# Patient Record
Sex: Female | Born: 1935 | Race: White | Hispanic: No | Smoking: Never smoker
Health system: Southern US, Community
[De-identification: ages and names within clinical notes are randomized; demographics above are authoritative.]

## PROBLEM LIST (undated history)

## (undated) DIAGNOSIS — I10 Essential (primary) hypertension: Secondary | ICD-10-CM

## (undated) DIAGNOSIS — M419 Scoliosis, unspecified: Secondary | ICD-10-CM

## (undated) DIAGNOSIS — Z9289 Personal history of other medical treatment: Secondary | ICD-10-CM

## (undated) DIAGNOSIS — M1990 Unspecified osteoarthritis, unspecified site: Secondary | ICD-10-CM

## (undated) DIAGNOSIS — G4489 Other headache syndrome: Secondary | ICD-10-CM

## (undated) DIAGNOSIS — Z9889 Other specified postprocedural states: Secondary | ICD-10-CM

## (undated) DIAGNOSIS — G459 Transient cerebral ischemic attack, unspecified: Secondary | ICD-10-CM

## (undated) DIAGNOSIS — Z7409 Other reduced mobility: Secondary | ICD-10-CM

## (undated) DIAGNOSIS — K5792 Diverticulitis of intestine, part unspecified, without perforation or abscess without bleeding: Secondary | ICD-10-CM

## (undated) DIAGNOSIS — M549 Dorsalgia, unspecified: Secondary | ICD-10-CM

## (undated) DIAGNOSIS — G8929 Other chronic pain: Secondary | ICD-10-CM

## (undated) HISTORY — PX: DILATION AND CURETTAGE OF UTERUS: SHX78

## (undated) HISTORY — PX: TONSILLECTOMY: SUR1361

## (undated) HISTORY — PX: APPENDECTOMY: SHX54

---

## 1971-05-21 HISTORY — PX: VAGINAL HYSTERECTOMY: SUR661

## 2000-05-20 HISTORY — PX: TOTAL KNEE ARTHROPLASTY: SHX125

## 2000-05-20 HISTORY — PX: JOINT REPLACEMENT: SHX530

## 2013-09-22 DIAGNOSIS — M5136 Other intervertebral disc degeneration, lumbar region: Secondary | ICD-10-CM | POA: Insufficient documentation

## 2013-09-22 DIAGNOSIS — M5416 Radiculopathy, lumbar region: Secondary | ICD-10-CM | POA: Insufficient documentation

## 2013-10-04 DIAGNOSIS — M1990 Unspecified osteoarthritis, unspecified site: Secondary | ICD-10-CM | POA: Insufficient documentation

## 2013-10-04 DIAGNOSIS — E663 Overweight: Secondary | ICD-10-CM | POA: Insufficient documentation

## 2013-10-04 DIAGNOSIS — M5412 Radiculopathy, cervical region: Secondary | ICD-10-CM | POA: Insufficient documentation

## 2013-10-04 DIAGNOSIS — I10 Essential (primary) hypertension: Secondary | ICD-10-CM | POA: Diagnosis present

## 2014-10-25 DIAGNOSIS — R51 Headache: Secondary | ICD-10-CM

## 2014-10-25 DIAGNOSIS — R519 Headache, unspecified: Secondary | ICD-10-CM | POA: Insufficient documentation

## 2014-10-25 DIAGNOSIS — D329 Benign neoplasm of meninges, unspecified: Secondary | ICD-10-CM | POA: Insufficient documentation

## 2014-10-31 DIAGNOSIS — M159 Polyosteoarthritis, unspecified: Secondary | ICD-10-CM | POA: Diagnosis present

## 2014-10-31 DIAGNOSIS — K5790 Diverticulosis of intestine, part unspecified, without perforation or abscess without bleeding: Secondary | ICD-10-CM | POA: Diagnosis present

## 2015-05-25 DIAGNOSIS — K5732 Diverticulitis of large intestine without perforation or abscess without bleeding: Secondary | ICD-10-CM | POA: Insufficient documentation

## 2015-06-08 DIAGNOSIS — H903 Sensorineural hearing loss, bilateral: Secondary | ICD-10-CM | POA: Diagnosis present

## 2015-07-16 ENCOUNTER — Encounter (HOSPITAL_BASED_OUTPATIENT_CLINIC_OR_DEPARTMENT_OTHER): Payer: Self-pay | Admitting: *Deleted

## 2015-07-16 ENCOUNTER — Emergency Department (HOSPITAL_BASED_OUTPATIENT_CLINIC_OR_DEPARTMENT_OTHER): Payer: Medicare Other

## 2015-07-16 ENCOUNTER — Emergency Department (HOSPITAL_BASED_OUTPATIENT_CLINIC_OR_DEPARTMENT_OTHER)
Admission: EM | Admit: 2015-07-16 | Discharge: 2015-07-16 | Disposition: A | Discharge status: Home / Self Care | Payer: Medicare Other | Attending: Emergency Medicine | Admitting: Emergency Medicine

## 2015-07-16 DIAGNOSIS — Z79899 Other long term (current) drug therapy: Secondary | ICD-10-CM | POA: Insufficient documentation

## 2015-07-16 DIAGNOSIS — I10 Essential (primary) hypertension: Secondary | ICD-10-CM | POA: Insufficient documentation

## 2015-07-16 DIAGNOSIS — Z8739 Personal history of other diseases of the musculoskeletal system and connective tissue: Secondary | ICD-10-CM | POA: Diagnosis not present

## 2015-07-16 DIAGNOSIS — M419 Scoliosis, unspecified: Secondary | ICD-10-CM | POA: Diagnosis not present

## 2015-07-16 DIAGNOSIS — K529 Noninfective gastroenteritis and colitis, unspecified: Secondary | ICD-10-CM | POA: Insufficient documentation

## 2015-07-16 DIAGNOSIS — R1032 Left lower quadrant pain: Secondary | ICD-10-CM | POA: Diagnosis present

## 2015-07-16 HISTORY — DX: Unspecified osteoarthritis, unspecified site: M19.90

## 2015-07-16 HISTORY — DX: Scoliosis, unspecified: M41.9

## 2015-07-16 HISTORY — DX: Diverticulitis of intestine, part unspecified, without perforation or abscess without bleeding: K57.92

## 2015-07-16 HISTORY — DX: Essential (primary) hypertension: I10

## 2015-07-16 LAB — CBC WITH DIFFERENTIAL/PLATELET
Basophils Absolute: 0 10*3/uL (ref 0.0–0.1)
Basophils Relative: 0 %
EOS ABS: 0.1 10*3/uL (ref 0.0–0.7)
Eosinophils Relative: 1 %
HEMATOCRIT: 36.1 % (ref 36.0–46.0)
HEMOGLOBIN: 12.3 g/dL (ref 12.0–15.0)
LYMPHS ABS: 2.1 10*3/uL (ref 0.7–4.0)
Lymphocytes Relative: 21 %
MCH: 31 pg (ref 26.0–34.0)
MCHC: 34.1 g/dL (ref 30.0–36.0)
MCV: 90.9 fL (ref 78.0–100.0)
MONO ABS: 0.7 10*3/uL (ref 0.1–1.0)
MONOS PCT: 8 %
NEUTROS PCT: 70 %
Neutro Abs: 7 10*3/uL (ref 1.7–7.7)
Platelets: 344 10*3/uL (ref 150–400)
RBC: 3.97 MIL/uL (ref 3.87–5.11)
RDW: 13.5 % (ref 11.5–15.5)
WBC: 9.9 10*3/uL (ref 4.0–10.5)

## 2015-07-16 LAB — COMPREHENSIVE METABOLIC PANEL
ALBUMIN: 3.9 g/dL (ref 3.5–5.0)
ALK PHOS: 45 U/L (ref 38–126)
ALT: 16 U/L (ref 14–54)
AST: 25 U/L (ref 15–41)
Anion gap: 6 (ref 5–15)
BUN: 17 mg/dL (ref 6–20)
CALCIUM: 8.8 mg/dL — AB (ref 8.9–10.3)
CHLORIDE: 98 mmol/L — AB (ref 101–111)
CO2: 27 mmol/L (ref 22–32)
CREATININE: 0.71 mg/dL (ref 0.44–1.00)
GFR calc non Af Amer: >60 mL/min (ref >60)
GLUCOSE: 101 mg/dL — AB (ref 65–99)
Potassium: 3.9 mmol/L (ref 3.5–5.1)
SODIUM: 131 mmol/L — AB (ref 135–145)
Total Bilirubin: 0.3 mg/dL (ref 0.3–1.2)
Total Protein: 7 g/dL (ref 6.5–8.1)

## 2015-07-16 LAB — URINALYSIS, ROUTINE W REFLEX MICROSCOPIC
Bilirubin Urine: NEGATIVE
Glucose, UA: NEGATIVE mg/dL
HGB URINE DIPSTICK: NEGATIVE
Ketones, ur: NEGATIVE mg/dL
NITRITE: NEGATIVE
PROTEIN: NEGATIVE mg/dL
SPECIFIC GRAVITY, URINE: 1.015 (ref 1.005–1.030)
pH: 6.5 (ref 5.0–8.0)

## 2015-07-16 LAB — URINE MICROSCOPIC-ADD ON

## 2015-07-16 LAB — LIPASE, BLOOD: LIPASE: 23 U/L (ref 11–51)

## 2015-07-16 MED ORDER — ONDANSETRON HCL 4 MG/2ML IJ SOLN
4.0000 mg | Freq: Once | INTRAMUSCULAR | Status: AC
  Administered 2015-07-16: 4 mg via INTRAVENOUS
  Filled 2015-07-16: qty 2

## 2015-07-16 MED ORDER — IOHEXOL 300 MG/ML  SOLN
100.0000 mL | Freq: Once | INTRAMUSCULAR | Status: AC | PRN
  Administered 2015-07-16: 100 mL via INTRAVENOUS

## 2015-07-16 MED ORDER — HYDROCODONE-ACETAMINOPHEN 5-325 MG PO TABS
1.0000 | ORAL_TABLET | Freq: Once | ORAL | Status: AC
  Administered 2015-07-16: 1 via ORAL
  Filled 2015-07-16: qty 1

## 2015-07-16 MED ORDER — CIPROFLOXACIN HCL 500 MG PO TABS
500.0000 mg | ORAL_TABLET | Freq: Once | ORAL | Status: AC
  Administered 2015-07-16: 500 mg via ORAL
  Filled 2015-07-16: qty 1

## 2015-07-16 MED ORDER — IOHEXOL 300 MG/ML  SOLN
25.0000 mL | Freq: Once | INTRAMUSCULAR | Status: AC | PRN
Start: 2015-07-16 — End: 2015-07-16
  Administered 2015-07-16: 25 mL via ORAL

## 2015-07-16 MED ORDER — CIPROFLOXACIN HCL 500 MG PO TABS: 500.0000 mg | ORAL_TABLET | Freq: Two times a day (BID) | ORAL | Status: DC

## 2015-07-16 MED ORDER — ONDANSETRON HCL 4 MG PO TABS: 4.0000 mg | ORAL_TABLET | Freq: Four times a day (QID) | ORAL | Status: DC

## 2015-07-16 MED ORDER — SODIUM CHLORIDE 0.9 % IV BOLUS (SEPSIS)
500.0000 mL | Freq: Once | INTRAVENOUS | Status: AC
  Administered 2015-07-16: 500 mL via INTRAVENOUS

## 2015-07-16 MED ORDER — METRONIDAZOLE 500 MG PO TABS: 500.0000 mg | ORAL_TABLET | Freq: Three times a day (TID) | ORAL | Status: DC

## 2015-07-16 MED ORDER — METRONIDAZOLE 500 MG PO TABS
500.0000 mg | ORAL_TABLET | Freq: Once | ORAL | Status: AC
Start: 2015-07-16 — End: 2015-07-16
  Administered 2015-07-16: 500 mg via ORAL
  Filled 2015-07-16: qty 1

## 2015-07-16 NOTE — ED Provider Notes (Signed)
CSN: [id number]     Arrival date & time 07/16/15  1453 History  By signing my name below, I, Elizabeth Mullins, attest that this documentation has been prepared under the direction and in the presence of No att. providers found. Electronically Signed: Doran Mullins, ED Scribe. 07/17/2015. 4:06 PM.   Chief Complaint  Patient presents with  . Diverticulitis   The history is provided by the patient. No language interpreter was used.   HPI Comments: Elizabeth Mullins is a 82 y.o. female  with a PMHx diverticulitis, arthritis, HTN and scoliosis who presents to the Emergency Department complaining of gradually worsening LLQ abdominal pain that began more than 1 week ago. Pt also reports watery diarrhea 2-3 days ago and an improving cough. Pt reports that she was seen by her doctor 1 week ago who has been treating her for diverticulitis. She finished taking cipro 2 days ago. Pt took a "pain pill" for her pain 1 day ago. Pt denies any fever, chills, vomiting, or any other symptoms at this time. Pt reports she is had the flue last week. Pt denies being on blood thinners. Pt is allergic to Codeine, Demerol and Morphine.   Past Medical History  Diagnosis Date  . Diverticulitis   . Arthritis   . Hypertension   . Scoliosis    Past Surgical History  Procedure Laterality Date  . Abdominal hysterectomy    . Appendectomy    . Tonsillectomy     History reviewed. No pertinent family history. Social History  Substance Use Topics  . Smoking status: Never Smoker   . Smokeless tobacco: None  . Alcohol Use: No   OB History    No data available     Review of Systems  Constitutional: Negative for fever and chills.  HENT: Negative for congestion and rhinorrhea.   Eyes: Negative for redness and visual disturbance.  Respiratory: Positive for cough. Negative for shortness of breath and wheezing.   Cardiovascular: Negative for chest pain and palpitations.  Gastrointestinal: Positive for abdominal pain and  diarrhea. Negative for nausea and vomiting.  Genitourinary: Negative for dysuria and urgency.  Musculoskeletal: Negative for myalgias and arthralgias.  Skin: Negative for pallor and wound.  Neurological: Negative for dizziness and headaches.  All other systems reviewed and are negative.  Allergies  Codeine; Demerol; Dilaudid; Fentanyl; Morphine and related; and Nsaids  Home Medications   Prior to Admission medications   Medication Sig Start Date End Date Taking? Authorizing Provider  atenolol (TENORMIN) 50 MG tablet Take 50 mg by mouth daily.   Yes Historical Provider, MD  carisoprodol (SOMA) 350 MG tablet Take 350 mg by mouth 4 (four) times daily as needed for muscle spasms.   Yes Historical Provider, MD  ciprofloxacin (CIPRO) 500 MG tablet Take 1 tablet (500 mg total) by mouth 2 (two) times daily. 07/16/15   Quintella Reichert, MD  doxazosin (CARDURA) 2 MG tablet Take 2 mg by mouth daily.   Yes Historical Provider, MD  gabapentin (NEURONTIN) 800 MG tablet Take 800 mg by mouth 3 (three) times daily.   Yes Historical Provider, MD  hydrochlorothiazide (HYDRODIURIL) 25 MG tablet Take 25 mg by mouth daily.   Yes Historical Provider, MD  hyoscyamine (ANASPAZ) 0.125 MG TBDP disintergrating tablet Place 0.125 mg under the tongue.   Yes Historical Provider, MD  metroNIDAZOLE (FLAGYL) 500 MG tablet Take 1 tablet (500 mg total) by mouth 3 (three) times daily. 07/16/15   Quintella Reichert, MD  ondansetron (ZOFRAN) 4 MG tablet Take  1 tablet (4 mg total) by mouth every 6 (six) hours. 07/16/15   Quintella Reichert, MD  raloxifene (EVISTA) 60 MG tablet Take 60 mg by mouth daily.   Yes Historical Provider, MD   BP 133/80 mmHg  Pulse 82  Temp(Src) 98.1 F (36.7 C) (Oral)  Resp 20  Ht 4\' 11"  (1.499 m)  Wt 150 lb (68.04 kg)  BMI 30.28 kg/m2  SpO2 92%   Physical Exam  Constitutional: She is oriented to person, place, and time. She appears well-developed and well-nourished.  HENT:  Head: Normocephalic and  atraumatic.  Cardiovascular: Normal rate and regular rhythm.   No murmur heard. Pulmonary/Chest: Effort normal and breath sounds normal. No respiratory distress.  Abdominal: Soft. There is tenderness (mild LLQ). There is no rebound. Guarding: voluntary.  Musculoskeletal: She exhibits edema (non-pitting BLE). She exhibits no tenderness.  Neurological: She is alert and oriented to person, place, and time.  Skin: Skin is warm and dry.  Psychiatric: She has a normal mood and affect. Her behavior is normal.  Nursing note and vitals reviewed.   ED Course  Procedures   DIAGNOSTIC STUDIES: Oxygen Saturation is 98% on room air, normal by my interpretation.    COORDINATION OF CARE: 4:02 PM Will give fluids. Will order urinalysis, blood work and CT abdomen. Discussed treatment plan with pt at bedside and pt agreed to plan.  Labs Review Labs Reviewed  URINALYSIS, ROUTINE W REFLEX MICROSCOPIC (NOT AT [facility]) - Abnormal; Notable for the following:    Leukocytes, UA SMALL (*)    All other components within normal limits  COMPREHENSIVE METABOLIC PANEL - Abnormal; Notable for the following:    Sodium 131 (*)    Chloride 98 (*)    Glucose, Bld 101 (*)    Calcium 8.8 (*)    All other components within normal limits  URINE MICROSCOPIC-ADD ON - Abnormal; Notable for the following:    Squamous Epithelial / LPF 0-5 (*)    Bacteria, UA FEW (*)    All other components within normal limits  CBC WITH DIFFERENTIAL/PLATELET  LIPASE, BLOOD    Imaging Review Ct Abdomen Pelvis W Contrast  07/16/2015  CLINICAL DATA:  82 year old female with left lower quadrant abdominal pain for the past 10 days. EXAM: CT ABDOMEN AND PELVIS WITH CONTRAST TECHNIQUE: Multidetector CT imaging of the abdomen and pelvis was performed using the standard protocol following bolus administration of intravenous contrast. CONTRAST:  182mL OMNIPAQUE IOHEXOL 300 MG/ML SOLN, 76mL OMNIPAQUE IOHEXOL 300 MG/ML SOLN COMPARISON:  None.  FINDINGS: The visualized lung bases are clear. No intra-abdominal free air. Trace free fluid within the pelvis. A 1.5 cm hypodense lesion in the left lobe of the liver as well as a subcentimeter hypodense lesion adjacent to the IVC and dome of the liver are not well characterized but likely represent or less likely hemangiomas. The liver is otherwise unremarkable. No gallstones or pericholecystic fluid. There is a focal of the common bile duct and head of the pancreas measuring approximately 1.4 cm in diameter. Associated adjacent pancreatic lesion less likely but not excluded. MRI may provide better evaluation. There is no atrophy of the gland or dilatation of the main pancreatic duct. The spleen, and the adrenal glands appear unremarkable. Subcentimeter left renal upper pole hypodense lesion is not well characterized but likely represents a. There is no hydronephrosis on either side. The visualized ureters and urinary bladder appear unremarkable. Hysterectomy. There is extensive sigmoid diverticulosis with muscular hypertrophy. There is diffuse thickening and inflammatory  changes of the rectosigmoid compatible with colitis. There is resulting luminal narrowing secondary to inflammation as well as muscular hypertrophy and impaction of stool in the sigmoid colon. Copious amount of dense stool noted throughout the colon. No evidence of bowel obstruction. The appendix is not visualized with certainty. No inflammatory changes identified in the right lower quadrant. The mild aortoiliac atherosclerotic disease. No portal venous gas identified. There is no adenopathy. Small fat containing left inguinal hernia. There is scoliosis, osteopenia, and extensive degenerative changes of the spine. No acute fracture. IMPRESSION: Inflammatory changes and thickening of the rectosigmoid compatible with colitis or proctocolitis. No evidence of perforation or abscess. Clinical correlation recommended. Constipation with fecal impaction  within the sigmoid colon. No bowel obstruction. Extensive sigmoid diverticulosis. Focal dilatation of common bile duct at the head of the pancreas. An adjacent pancreatic lesion is less likely but not excluded. MRI or MRCP may provide better evaluation. Electronically Signed   By: Anner Crete M.D.   On: 07/16/2015 18:29   I have personally reviewed and evaluated these images and lab results as part of my medical decision-making.   EKG Interpretation None      MDM   Final diagnoses:  Colitis   Pt here with abdominal pain, diarrhea.  Pain is completely resolved on repeat evaluation in the department with soft, nontender abdomen.  She was treated for diverticulitis but has been off of abx for several days.  Will restart abx with close GI and PCP follow up, close return precautions.  She is able to tolerate po without difficulty.    I personally performed the services described in this documentation, which was scribed in my presence. The recorded information has been reviewed and is accurate.    Quintella Reichert, MD 07/17/15 1259

## 2015-07-16 NOTE — Discharge Instructions (Signed)
°  Colitis °Colitis is inflammation of the colon. Colitis may last a short time (acute) or it may last a long time (chronic). °CAUSES °This condition may be caused by: °· Viruses. °· Bacteria. °· Reactions to medicine. °· Certain autoimmune diseases, such as Crohn disease or ulcerative colitis. °SYMPTOMS °Symptoms of this condition include: °· Diarrhea. °· Passing bloody or tarry stool. °· Pain. °· Fever. °· Vomiting. °· Tiredness (fatigue). °· Weight loss. °· Bloating. °· Sudden increase in abdominal pain. °· Having fewer bowel movements than usual. °DIAGNOSIS °This condition is diagnosed with a stool test or a blood test. You may also have other tests, including X-rays, a CT scan, or a colonoscopy. °TREATMENT °Treatment may include: °· Resting the bowel. This involves not eating or drinking for a period of time. °· Fluids that are given through an IV tube. °· Medicine for pain and diarrhea. °· Antibiotic medicines. °· Cortisone medicines. °· Surgery. °HOME CARE INSTRUCTIONS °Eating and Drinking °· Follow instructions from your health care provider about eating or drinking restrictions. °· Drink enough fluid to keep your urine clear or pale yellow. °· Work with a dietitian to determine which foods cause your condition to flare up. °· Avoid foods that cause flare-ups. °· Eat a well-balanced diet. °Medicines °· Take over-the-counter and prescription medicines only as told by your health care provider. °· If you were prescribed an antibiotic medicine, take it as told by your health care provider. Do not stop taking the antibiotic even if you start to feel better. °General Instructions °· Keep all follow-up visits as told by your health care provider. This is important. °SEEK MEDICAL CARE IF: °· Your symptoms do not go away. °· You develop new symptoms. °SEEK IMMEDIATE MEDICAL CARE IF: °· You have a fever that does not go away with treatment. °· You develop chills. °· You have extreme weakness, fainting, or  dehydration. °· You have repeated vomiting. °· You develop severe pain in your abdomen. °· You pass bloody or tarry stool. °  °This information is not intended to replace advice given to you by your health care provider. Make sure you discuss any questions you have with your health care provider. °  °Document Released: 06/13/2004 Document Revised: 01/25/2015 Document Reviewed: 08/29/2014 °Elsevier Interactive Patient Education ©2016 [employer] ° °

## 2015-07-16 NOTE — ED Notes (Signed)
Pt states she tolerates PO hydrocodone well. PT requesting pain medication. Notitifed EDP.

## 2015-07-16 NOTE — ED Notes (Signed)
Generalized abdominal pain x 10 days.  Pt is being treated for diverticulitis by her PCP but denies relief.

## 2015-08-24 ENCOUNTER — Emergency Department (HOSPITAL_COMMUNITY): Payer: Medicare Other

## 2015-08-24 ENCOUNTER — Encounter (HOSPITAL_COMMUNITY): Payer: Self-pay | Admitting: *Deleted

## 2015-08-24 ENCOUNTER — Inpatient Hospital Stay (HOSPITAL_COMMUNITY)
Admission: EM | Admit: 2015-08-24 | Discharge: 2015-08-28 | DRG: 392 | Disposition: A | Discharge status: Home Health | Payer: Medicare Other | Attending: Internal Medicine | Admitting: Internal Medicine

## 2015-08-24 DIAGNOSIS — R8290 Unspecified abnormal findings in urine: Secondary | ICD-10-CM | POA: Diagnosis present

## 2015-08-24 DIAGNOSIS — R634 Abnormal weight loss: Secondary | ICD-10-CM | POA: Diagnosis present

## 2015-08-24 DIAGNOSIS — K5720 Diverticulitis of large intestine with perforation and abscess without bleeding: Principal | ICD-10-CM | POA: Diagnosis present

## 2015-08-24 DIAGNOSIS — K5700 Diverticulitis of small intestine with perforation and abscess without bleeding: Secondary | ICD-10-CM

## 2015-08-24 DIAGNOSIS — M549 Dorsalgia, unspecified: Secondary | ICD-10-CM | POA: Diagnosis not present

## 2015-08-24 DIAGNOSIS — G8929 Other chronic pain: Secondary | ICD-10-CM | POA: Diagnosis present

## 2015-08-24 DIAGNOSIS — K651 Peritoneal abscess: Secondary | ICD-10-CM | POA: Diagnosis not present

## 2015-08-24 DIAGNOSIS — I10 Essential (primary) hypertension: Secondary | ICD-10-CM | POA: Diagnosis present

## 2015-08-24 DIAGNOSIS — M1990 Unspecified osteoarthritis, unspecified site: Secondary | ICD-10-CM | POA: Diagnosis present

## 2015-08-24 DIAGNOSIS — M419 Scoliosis, unspecified: Secondary | ICD-10-CM | POA: Diagnosis present

## 2015-08-24 DIAGNOSIS — K5780 Diverticulitis of intestine, part unspecified, with perforation and abscess without bleeding: Secondary | ICD-10-CM

## 2015-08-24 DIAGNOSIS — K589 Irritable bowel syndrome without diarrhea: Secondary | ICD-10-CM | POA: Diagnosis present

## 2015-08-24 DIAGNOSIS — M12862 Other specific arthropathies, not elsewhere classified, left knee: Secondary | ICD-10-CM | POA: Diagnosis present

## 2015-08-24 DIAGNOSIS — K869 Disease of pancreas, unspecified: Secondary | ICD-10-CM | POA: Diagnosis present

## 2015-08-24 DIAGNOSIS — K8689 Other specified diseases of pancreas: Secondary | ICD-10-CM | POA: Diagnosis present

## 2015-08-24 DIAGNOSIS — R1032 Left lower quadrant pain: Secondary | ICD-10-CM | POA: Diagnosis present

## 2015-08-24 HISTORY — DX: Dorsalgia, unspecified: M54.9

## 2015-08-24 HISTORY — DX: Other headache syndrome: G44.89

## 2015-08-24 HISTORY — DX: Other chronic pain: G89.29

## 2015-08-24 LAB — URINALYSIS, ROUTINE W REFLEX MICROSCOPIC
BILIRUBIN URINE: NEGATIVE
GLUCOSE, UA: NEGATIVE mg/dL
Hgb urine dipstick: NEGATIVE
KETONES UR: NEGATIVE mg/dL
Nitrite: NEGATIVE
Protein, ur: NEGATIVE mg/dL
Specific Gravity, Urine: 1.015 (ref 1.005–1.030)
pH: 6.5 (ref 5.0–8.0)

## 2015-08-24 LAB — COMPREHENSIVE METABOLIC PANEL
ALBUMIN: 3.3 g/dL — AB (ref 3.5–5.0)
ALK PHOS: 58 U/L (ref 38–126)
ALT: 16 U/L (ref 14–54)
AST: 19 U/L (ref 15–41)
Anion gap: 9 (ref 5–15)
BILIRUBIN TOTAL: 0.4 mg/dL (ref 0.3–1.2)
BUN: 16 mg/dL (ref 6–20)
CALCIUM: 9.5 mg/dL (ref 8.9–10.3)
CO2: 24 mmol/L (ref 22–32)
Chloride: 103 mmol/L (ref 101–111)
Creatinine, Ser: 0.7 mg/dL (ref 0.44–1.00)
GFR calc Af Amer: >60 mL/min (ref >60)
GFR calc non Af Amer: >60 mL/min (ref >60)
GLUCOSE: 114 mg/dL — AB (ref 65–99)
Potassium: 4.6 mmol/L (ref 3.5–5.1)
SODIUM: 136 mmol/L (ref 135–145)
TOTAL PROTEIN: 7 g/dL (ref 6.5–8.1)

## 2015-08-24 LAB — CBC
HCT: 36.8 % (ref 36.0–46.0)
Hemoglobin: 11.9 g/dL — ABNORMAL LOW (ref 12.0–15.0)
MCH: 29.6 pg (ref 26.0–34.0)
MCHC: 32.3 g/dL (ref 30.0–36.0)
MCV: 91.5 fL (ref 78.0–100.0)
PLATELETS: 323 10*3/uL (ref 150–400)
RBC: 4.02 MIL/uL (ref 3.87–5.11)
RDW: 13.5 % (ref 11.5–15.5)
WBC: 15.9 10*3/uL — ABNORMAL HIGH (ref 4.0–10.5)

## 2015-08-24 LAB — PROTIME-INR
INR: 1.09 (ref 0.00–1.49)
Prothrombin Time: 14.3 seconds (ref 11.6–15.2)

## 2015-08-24 LAB — PHOSPHORUS: Phosphorus: 2.8 mg/dL (ref 2.5–4.6)

## 2015-08-24 LAB — LIPASE, BLOOD: Lipase: 19 U/L (ref 11–51)

## 2015-08-24 LAB — URINE MICROSCOPIC-ADD ON

## 2015-08-24 LAB — MAGNESIUM: MAGNESIUM: 2.1 mg/dL (ref 1.7–2.4)

## 2015-08-24 MED ORDER — LORAZEPAM 2 MG/ML IJ SOLN
0.2500 mg | INTRAMUSCULAR | Status: AC | PRN
  Administered 2015-08-24 – 2015-08-26 (×2): 0.25 mg via INTRAVENOUS
  Filled 2015-08-24 (×2): qty 1

## 2015-08-24 MED ORDER — ONDANSETRON HCL 4 MG/2ML IJ SOLN
4.0000 mg | Freq: Once | INTRAMUSCULAR | Status: AC
  Administered 2015-08-24: 4 mg via INTRAVENOUS
  Filled 2015-08-24: qty 2

## 2015-08-24 MED ORDER — SODIUM CHLORIDE 0.9 % IV SOLN
INTRAVENOUS | Status: DC
  Administered 2015-08-24 (×2): via INTRAVENOUS

## 2015-08-24 MED ORDER — DOXAZOSIN MESYLATE 2 MG PO TABS
2.0000 mg | ORAL_TABLET | Freq: Every day | ORAL | Status: DC
  Administered 2015-08-25 – 2015-08-28 (×4): 2 mg via ORAL
  Filled 2015-08-24 (×4): qty 1

## 2015-08-24 MED ORDER — HYDROMORPHONE HCL 1 MG/ML IJ SOLN
0.5000 mg | INTRAMUSCULAR | Status: DC | PRN
  Administered 2015-08-24 – 2015-08-25 (×6): 1 mg via INTRAVENOUS
  Administered 2015-08-27: 0.5 mg via INTRAVENOUS
  Filled 2015-08-24 (×7): qty 1

## 2015-08-24 MED ORDER — ATENOLOL 50 MG PO TABS
50.0000 mg | ORAL_TABLET | Freq: Every day | ORAL | Status: DC
  Administered 2015-08-25 – 2015-08-28 (×4): 50 mg via ORAL
  Filled 2015-08-24 (×4): qty 1

## 2015-08-24 MED ORDER — CIPROFLOXACIN IN D5W 400 MG/200ML IV SOLN
400.0000 mg | Freq: Two times a day (BID) | INTRAVENOUS | Status: DC
  Administered 2015-08-25 – 2015-08-26 (×3): 400 mg via INTRAVENOUS
  Filled 2015-08-24 (×3): qty 200

## 2015-08-24 MED ORDER — PROCHLORPERAZINE EDISYLATE 5 MG/ML IJ SOLN
5.0000 mg | Freq: Four times a day (QID) | INTRAMUSCULAR | Status: DC | PRN
  Administered 2015-08-24: 5 mg via INTRAVENOUS
  Filled 2015-08-24 (×2): qty 1

## 2015-08-24 MED ORDER — MORPHINE SULFATE (PF) 2 MG/ML IV SOLN
1.0000 mg | INTRAVENOUS | Status: DC | PRN
  Administered 2015-08-24: 2 mg via INTRAVENOUS
  Filled 2015-08-24: qty 1

## 2015-08-24 MED ORDER — ACETAMINOPHEN 650 MG RE SUPP: 650.0000 mg | Freq: Four times a day (QID) | RECTAL | Status: DC | PRN

## 2015-08-24 MED ORDER — CIPROFLOXACIN IN D5W 400 MG/200ML IV SOLN
400.0000 mg | Freq: Once | INTRAVENOUS | Status: AC
  Administered 2015-08-24: 400 mg via INTRAVENOUS
  Filled 2015-08-24: qty 200

## 2015-08-24 MED ORDER — METRONIDAZOLE IN NACL 5-0.79 MG/ML-% IV SOLN
500.0000 mg | Freq: Three times a day (TID) | INTRAVENOUS | Status: DC
  Administered 2015-08-25 – 2015-08-26 (×5): 500 mg via INTRAVENOUS
  Filled 2015-08-24 (×5): qty 100

## 2015-08-24 MED ORDER — METRONIDAZOLE IN NACL 5-0.79 MG/ML-% IV SOLN
500.0000 mg | Freq: Once | INTRAVENOUS | Status: AC
  Administered 2015-08-24: 500 mg via INTRAVENOUS
  Filled 2015-08-24: qty 100

## 2015-08-24 MED ORDER — FENTANYL CITRATE (PF) 100 MCG/2ML IJ SOLN
25.0000 ug | Freq: Once | INTRAMUSCULAR | Status: AC
  Administered 2015-08-24: 25 ug via INTRAVENOUS
  Filled 2015-08-24: qty 2

## 2015-08-24 MED ORDER — SODIUM CHLORIDE 0.9 % IV BOLUS (SEPSIS)
250.0000 mL | Freq: Once | INTRAVENOUS | Status: AC
  Administered 2015-08-24: 250 mL via INTRAVENOUS

## 2015-08-24 MED ORDER — ONDANSETRON HCL 4 MG/2ML IJ SOLN
4.0000 mg | Freq: Four times a day (QID) | INTRAMUSCULAR | Status: DC
  Administered 2015-08-24 – 2015-08-26 (×8): 4 mg via INTRAVENOUS
  Filled 2015-08-24 (×12): qty 2

## 2015-08-24 MED ORDER — ACETAMINOPHEN 325 MG PO TABS
650.0000 mg | ORAL_TABLET | Freq: Four times a day (QID) | ORAL | Status: DC | PRN
  Administered 2015-08-26: 650 mg via ORAL
  Filled 2015-08-24: qty 2

## 2015-08-24 MED ORDER — IOPAMIDOL (ISOVUE-300) INJECTION 61%
INTRAVENOUS | Status: AC
  Administered 2015-08-24: 100 mL
  Filled 2015-08-24: qty 100

## 2015-08-24 NOTE — Consult Note (Signed)
Reason for Consult: diverticulitis with abscess Referring Physician: Dr. Fredia Sorrow   HPI: Elizabeth Mullins is a 82 year old female with a history of diverticulitis, hypertension and scoliosis who presents with a 4-5 day history of LLQ abdominal pain.  Denies nausea, vomiting, fever, chills or sweats.  Endorses to 7lb weight loss over the past 2 weeks.  [place] was [place] with a episode which resolved with IV antibiotics.  Over the last 6 months, she reports 3 bouts of diverticulitis which were treated with oral antibiotics and on outpatient basis.  She had a colonoscopy 4-5 years ago in [place] which showed diverticulitis.  Strong family history of diverticulitis 7/9 sisters, mother and grandmother, some required one stage resections while others had a hartmans.  One sister who is now in her 10s had colon cancer.  ED work up reveals WBC 15.9.  CT of abdomen and pelvis with a 6cm abscess, presumably diverticular.   Denies hematochezia or diarrhea.  Denies vaginal or urethral feculent drainage.  Previous abdominal surgeries include an appendectomy and abdominal hysterectomy. Denies any cardiac history.   Past Medical History  Diagnosis Date  . Diverticulitis   . Arthritis   . Hypertension   . Scoliosis     Past Surgical History  Procedure Laterality Date  . Abdominal hysterectomy    . Appendectomy    . Tonsillectomy    . Knee arthroscopy Right     No family history on file.  Social History:  reports that she has never smoked. She does not have any smokeless tobacco history on file. She reports that she does not drink alcohol or use illicit drugs.  Allergies:  Allergies  Allergen Reactions  . Codeine Nausea And Vomiting  . Demerol [Meperidine] Nausea And Vomiting  . Dilaudid [Hydromorphone Hcl]     Hallucination   . Fentanyl     Hallucinations   . Morphine And Related     Nausea and vomiting   . Nsaids     Medications:  Scheduled Meds:  Continuous  Infusions: . [START ON 08/25/2015] sodium chloride 100 mL/hr at 08/24/15 1255  . ciprofloxacin 400 mg (08/24/15 1543)  . metronidazole 500 mg (08/24/15 1547)   PRN Meds:.   Results for orders placed or performed during the hospital encounter of 08/24/15 (from the past 48 hour(s))  Lipase, blood     Status: None   Collection Time: 08/24/15 10:08 AM  Result Value Ref Range   Lipase 19 11 - 51 U/L  Comprehensive metabolic panel     Status: Abnormal   Collection Time: 08/24/15 10:08 AM  Result Value Ref Range   Sodium 136 135 - 145 mmol/L   Potassium 4.6 3.5 - 5.1 mmol/L   Chloride 103 101 - 111 mmol/L   CO2 24 22 - 32 mmol/L   Glucose, Bld 114 (H) 65 - 99 mg/dL   BUN 16 6 - 20 mg/dL   Creatinine, Ser 0.70 0.44 - 1.00 mg/dL   Calcium 9.5 8.9 - 10.3 mg/dL   Total Protein 7.0 6.5 - 8.1 g/dL   Albumin 3.3 (L) 3.5 - 5.0 g/dL   AST 19 15 - 41 U/L   ALT 16 14 - 54 U/L   Alkaline Phosphatase 58 38 - 126 U/L   Total Bilirubin 0.4 0.3 - 1.2 mg/dL   GFR calc non Af Amer >60 >60 mL/min   GFR calc Af Amer >60 >60 mL/min    Comment: (NOTE) The eGFR has  been calculated using the CKD EPI equation. This calculation has not been validated in all clinical situations. eGFR's persistently <60 mL/min signify possible Chronic Kidney Disease.    Anion gap 9 5 - 15  CBC     Status: Abnormal   Collection Time: 08/24/15 10:08 AM  Result Value Ref Range   WBC 15.9 (H) 4.0 - 10.5 K/uL   RBC 4.02 3.87 - 5.11 MIL/uL   Hemoglobin 11.9 (L) 12.0 - 15.0 g/dL   HCT 36.8 36.0 - 46.0 %   MCV 91.5 78.0 - 100.0 fL   MCH 29.6 26.0 - 34.0 pg   MCHC 32.3 30.0 - 36.0 g/dL   RDW 13.5 11.5 - 15.5 %   Platelets 323 150 - 400 K/uL  Urinalysis, Routine w reflex microscopic (not at [facility])     Status: Abnormal   Collection Time: 08/24/15 12:50 PM  Result Value Ref Range   Color, Urine YELLOW YELLOW   APPearance CLEAR CLEAR   Specific Gravity, Urine 1.015 1.005 - 1.030   pH 6.5 5.0 - 8.0   Glucose, UA NEGATIVE  NEGATIVE mg/dL   Hgb urine dipstick NEGATIVE NEGATIVE   Bilirubin Urine NEGATIVE NEGATIVE   Ketones, ur NEGATIVE NEGATIVE mg/dL   Protein, ur NEGATIVE NEGATIVE mg/dL   Nitrite NEGATIVE NEGATIVE   Leukocytes, UA TRACE (A) NEGATIVE  Urine microscopic-add on     Status: Abnormal   Collection Time: 08/24/15 12:50 PM  Result Value Ref Range   Squamous Epithelial / LPF 0-5 (A) NONE SEEN   WBC, UA 6-30 0 - 5 WBC/hpf   RBC / HPF 0-5 0 - 5 RBC/hpf   Bacteria, UA FEW (A) NONE SEEN    Ct Abdomen Pelvis W Contrast  08/24/2015  CLINICAL DATA:  Left lower quadrant abdominal pain EXAM: CT ABDOMEN AND PELVIS WITH CONTRAST TECHNIQUE: Multidetector CT imaging of the abdomen and pelvis was performed using the standard protocol following bolus administration of intravenous contrast. CONTRAST:  1 ISOVUE-300 IOPAMIDOL (ISOVUE-300) INJECTION 61% COMPARISON:  07/16/2015 FINDINGS: Lower chest and abdominal wall: Fatty Bochdalek's hernia on the right Hepatobiliary: Cystic density segment 2 lesion measuring 13 mm. Coarse parenchymal calcifications considered incidental.No evidence of biliary obstruction or stone. Pancreas: Ovoid low-density at the pancreatic neck measuring 21 mm in axial dimension, favor cystic pancreatic mass over focal dilatation the common bile duct given the normal diameter distally the pancreatic head. No malignant type enhancement or nodule. Spleen: Unremarkable. Adrenals/Urinary Tract: Negative adrenals. No hydronephrosis or stone. 1 cm upper pole cyst on the left. Right renal cortical scarring and thinning. Distorted urinary bladder via abscess. No pneumaturia or bladder wall thickening to suggest fistula. Reproductive:Hysterectomy. Stomach/Bowel: Inflammatory changes around the sigmoid colon have improved, but there is a new 60 x 33 x 35 mm thick walled gas and fluid collection interposed between the sigmoid colon and bladder. Given previous findings and extensive diverticulosis, this is likely  diverticular abscess. Large stool volume on a chronic basis. No pericecal inflammation. Vascular/Lymphatic: No acute vascular abnormality. No mass or adenopathy. Peritoneal: No ascites or pneumoperitoneum. Musculoskeletal: No acute abnormalities. Advanced disc and facet degeneration. IMPRESSION: 1. 6 cm abscess between the sigmoid colon and bladder, presumably diverticular given recent CT findings. 2. 21 mm cystic pancreatic head mass which could be re-evaluated in 1 year by MRI or CT. Additional chronic findings are described above Electronically Signed   By: Monte Fantasia M.D.   On: 08/24/2015 14:35    Review of Systems  Constitutional: Positive for weight  loss. Negative for fever, chills, malaise/fatigue and diaphoresis.  Eyes: Negative for blurred vision, double vision, photophobia, pain, discharge and redness.  Respiratory: Negative for cough, hemoptysis, sputum production, shortness of breath and wheezing.   Cardiovascular: Negative for chest pain, palpitations, orthopnea, claudication, leg swelling and PND.  Gastrointestinal: Positive for abdominal pain. Negative for nausea, vomiting, diarrhea, constipation, blood in stool and melena.  Genitourinary: Negative for dysuria, urgency, frequency, hematuria and flank pain.  Musculoskeletal: Positive for neck pain. Negative for myalgias, back pain and joint pain.  Neurological: Negative for dizziness, tingling, tremors, sensory change, speech change, focal weakness, seizures, loss of consciousness, weakness and headaches.  Psychiatric/Behavioral: Negative for depression, suicidal ideas, hallucinations, memory loss and substance abuse. The patient is not nervous/anxious and does not have insomnia.    Blood pressure 154/76, pulse 85, temperature 97.9 F (36.6 C), temperature source Oral, resp. rate 16, SpO2 96 %. Physical Exam  Constitutional: She is oriented to person, place, and time. She appears well-developed and well-nourished. No distress.   Cardiovascular: Normal rate, regular rhythm and intact distal pulses.  Exam reveals no friction rub.   No murmur heard. Respiratory: Effort normal and breath sounds normal. No respiratory distress. She has no wheezes. She has no rales. She exhibits no tenderness.  GI: Soft. She exhibits no distension and no mass. There is no rebound and no guarding.  Mild ttp LLQ  Musculoskeletal: Normal range of motion. She exhibits no edema or tenderness.  Neurological: She is alert and oriented to person, place, and time.  Skin: Skin is warm and dry. No rash noted. She is not diaphoretic. No erythema. No pallor.  Psychiatric: She has a normal mood and affect. Her behavior is normal. Judgment and thought content normal.    Assessment/Plan: Presumed sigmoid diverticulitis with 6cm abscess The patient does not appear ill.  There are no indications for urgent surgical intervention.  Will ask IR if able to drain the abscess.  The patient understands should she acutely worsen that she may require a hartmann's procedure.  ID-rocephin/flagyl FEN-may have sips of clears, NPO after midnight VTE prophylaxis-may have chemical VTE  Dispo-medical admission, floor okay from surgical standpoint  Oneida Arenas [facility] ANP-BC Pager [phone] 08/24/2015, 4:06 PM

## 2015-08-24 NOTE — H&P (Signed)
Chief Complaint: Abdominal pain  Referring Physician(s): Serita Grammes  Supervising Physician: Marybelle Killings  History of Present Illness: Elizabeth Mullins is a 82 y.o. female with a PMHx diverticulitis, arthritis, HTN and scoliosis who presented to the Emergency Department complaining of gradually worsening LLQ abdominal pain that began more than 1 week ago.   She also reports watery diarrhea 2-3 days ago and an improving cough.   She reports that she was seen by her doctor 1 week ago who has been treating her for diverticulitis.   She finished taking cipro 2 days ago. Pt took a "pain pill" for her pain 1 day ago.   She also states she has had some issues with constipation.  She denies any fever, chills, vomiting, or any other symptoms at this time.   She reports she is had the flue last week. Pt denies being on blood thinners.  CT scan shows Inflammatory changes around the sigmoid colon have improved, but there is a new 60 x 33 x 35 mm thick walled gas and fluid collection interposed between the sigmoid colon and bladder. Given previous findings and extensive diverticulosis, this is likely diverticular abscess.  We are asked to evaluate her for a CT guided drain placement.  INR is pending  Past Medical History  Diagnosis Date  . Diverticulitis   . Arthritis   . Hypertension   . Scoliosis     Past Surgical History  Procedure Laterality Date  . Abdominal hysterectomy    . Appendectomy    . Tonsillectomy    . Knee arthroscopy Right     Allergies: Codeine; Demerol; Dilaudid; Fentanyl; Morphine and related; and Nsaids  Medications: Prior to Admission medications   Medication Sig Start Date End Date Taking? Authorizing Provider  atenolol (TENORMIN) 50 MG tablet Take 50 mg by mouth daily.   Yes Historical Provider, MD  carisoprodol (SOMA) 350 MG tablet Take 350 mg by mouth 4 (four) times daily as needed for muscle spasms.   Yes Historical Provider, MD  doxazosin  (CARDURA) 2 MG tablet Take 2 mg by mouth daily.   Yes Historical Provider, MD  gabapentin (NEURONTIN) 800 MG tablet Take 800 mg by mouth 3 (three) times daily.   Yes Historical Provider, MD  hydrochlorothiazide (HYDRODIURIL) 25 MG tablet Take 25 mg by mouth daily.   Yes Historical Provider, MD  hyoscyamine (ANASPAZ) 0.125 MG TBDP disintergrating tablet Place 0.125 mg under the tongue.   Yes Historical Provider, MD  raloxifene (EVISTA) 60 MG tablet Take 60 mg by mouth daily.   Yes Historical Provider, MD  ciprofloxacin (CIPRO) 500 MG tablet Take 1 tablet (500 mg total) by mouth 2 (two) times daily. 07/16/15   Quintella Reichert, MD  metroNIDAZOLE (FLAGYL) 500 MG tablet Take 1 tablet (500 mg total) by mouth 3 (three) times daily. 07/16/15   Quintella Reichert, MD  ondansetron (ZOFRAN) 4 MG tablet Take 1 tablet (4 mg total) by mouth every 6 (six) hours. 07/16/15   Quintella Reichert, MD     No family history on file.  Social History   Social History  . Marital Status: Widowed    Spouse Name: N/A  . Number of Children: N/A  . Years of Education: N/A   Social History Main Topics  . Smoking status: Never Smoker   . Smokeless tobacco: None  . Alcohol Use: No  . Drug Use: No  . Sexual Activity: Not Asked   Other Topics Concern  . None   Social History  Narrative    Review of Systems: A 12 point ROS discussed  Review of Systems  Constitutional: Positive for appetite change. Negative for fever, chills, activity change and fatigue.  HENT: Negative.   Respiratory: Positive for cough. Negative for chest tightness.   Cardiovascular: Negative for chest pain.  Gastrointestinal: Positive for abdominal pain, diarrhea and constipation. Negative for nausea and vomiting.  Genitourinary: Negative.   Musculoskeletal: Negative.   Skin: Negative.   Neurological: Negative.   Psychiatric/Behavioral: Negative.     Vital Signs: BP 163/82 mmHg  Pulse 92  Temp(Src) 97.9 F (36.6 C) (Oral)  Resp 20  SpO2  97%  Physical Exam  Constitutional: She is oriented to person, place, and time. She appears well-developed and well-nourished.  HENT:  Head: Normocephalic and atraumatic.  Eyes: EOM are normal.  Neck: Normal range of motion. Neck supple.  Cardiovascular: Normal rate, regular rhythm and normal heart sounds.   Pulmonary/Chest: Effort normal and breath sounds normal.  Abdominal: There is tenderness.  Somewhat firm, likely secondary to constipation.  Musculoskeletal: Normal range of motion.  Neurological: She is alert and oriented to person, place, and time.  Skin: Skin is warm and dry.  Psychiatric: She has a normal mood and affect. Her behavior is normal. Judgment and thought content normal.  Vitals reviewed.   Mallampati Score:  MD Evaluation Airway: WNL Heart: WNL Abdomen: WNL Chest/ Lungs: WNL ASA  Classification: 3 Mallampati/Airway Score: One  Imaging: Ct Abdomen Pelvis W Contrast  08/24/2015  CLINICAL DATA:  Left lower quadrant abdominal pain EXAM: CT ABDOMEN AND PELVIS WITH CONTRAST TECHNIQUE: Multidetector CT imaging of the abdomen and pelvis was performed using the standard protocol following bolus administration of intravenous contrast. CONTRAST:  1 ISOVUE-300 IOPAMIDOL (ISOVUE-300) INJECTION 61% COMPARISON:  07/16/2015 FINDINGS: Lower chest and abdominal wall: Fatty Bochdalek's hernia on the right Hepatobiliary: Cystic density segment 2 lesion measuring 13 mm. Coarse parenchymal calcifications considered incidental.No evidence of biliary obstruction or stone. Pancreas: Ovoid low-density at the pancreatic neck measuring 21 mm in axial dimension, favor cystic pancreatic mass over focal dilatation the common bile duct given the normal diameter distally the pancreatic head. No malignant type enhancement or nodule. Spleen: Unremarkable. Adrenals/Urinary Tract: Negative adrenals. No hydronephrosis or stone. 1 cm upper pole cyst on the left. Right renal cortical scarring and thinning.  Distorted urinary bladder via abscess. No pneumaturia or bladder wall thickening to suggest fistula. Reproductive:Hysterectomy. Stomach/Bowel: Inflammatory changes around the sigmoid colon have improved, but there is a new 60 x 33 x 35 mm thick walled gas and fluid collection interposed between the sigmoid colon and bladder. Given previous findings and extensive diverticulosis, this is likely diverticular abscess. Large stool volume on a chronic basis. No pericecal inflammation. Vascular/Lymphatic: No acute vascular abnormality. No mass or adenopathy. Peritoneal: No ascites or pneumoperitoneum. Musculoskeletal: No acute abnormalities. Advanced disc and facet degeneration. IMPRESSION: 1. 6 cm abscess between the sigmoid colon and bladder, presumably diverticular given recent CT findings. 2. 21 mm cystic pancreatic head mass which could be re-evaluated in 1 year by MRI or CT. Additional chronic findings are described above Electronically Signed   By: Monte Fantasia M.D.   On: 08/24/2015 14:35    Labs:  CBC:  Recent Labs  07/16/15 1620 08/24/15 1008  WBC 9.9 15.9*  HGB 12.3 11.9*  HCT 36.1 36.8  PLT 344 323    COAGS: No results for input(s): INR, APTT in the last 8760 hours.  BMP:  Recent Labs  07/16/15 1620 08/24/15  1008  NA 131* 136  K 3.9 4.6  CL 98* 103  CO2 27 24  GLUCOSE 101* 114*  BUN 17 16  CALCIUM 8.8* 9.5  CREATININE 0.71 0.70  GFRNONAA >60 >60  GFRAA >60 >60    LIVER FUNCTION TESTS:  Recent Labs  07/16/15 1620 08/24/15 1008  BILITOT 0.3 0.4  AST 25 19  ALT 16 16  ALKPHOS 45 58  PROT 7.0 7.0  ALBUMIN 3.9 3.3*    TUMOR MARKERS: No results for input(s): AFPTM, CEA, CA199, CHROMGRNA in the last 8760 hours.  Assessment and Plan:  Abdominal pain secondary to diverticular abscess  Will proceed with CT guided drain placement tomorrow by Dr. Anselm Pancoast  Risks and Benefits discussed with the patient including bleeding, infection, damage to adjacent structures,  bowel perforation/fistula connection, and sepsis.  All of the patient's questions were answered, patient is agreeable to proceed. Consent signed and in chart.  Thank you for this interesting consult.  I greatly enjoyed meeting Haiden Toppin and look forward to participating in their care.  A copy of this report was sent to the requesting provider on this date.  Electronically Signed: Murrell Redden PA-C 08/24/2015, 4:29 PM   I spent a total of 40 Minutes in face to face in clinical consultation, greater than 50% of which was counseling/coordinating care for abscess drain.

## 2015-08-24 NOTE — Progress Notes (Signed)
Patient was admitted around 1745H c/o pain with slight relief from morphine given in ED. Called and notified Erin Hearing (NP) about it and told her too of the patient's request of different type of medicine. Telephone ordered received and transcribed. See orders.

## 2015-08-24 NOTE — H&P (Signed)
[facility] History and Physical                                                                                    Elizabeth Mullins, is a 82 y.o. female  MRN: [id number]   DOB - March 23, 1932  Admit Date - 08/24/2015  Outpatient Primary MD for the patient is Idamae Schuller, MD  Referring MD: Rogene Houston / ER  Consulting M.D: Donne Hazel / Surgery; Hoss / Interventional Radiology  PMH: Past Medical History  Diagnosis Date  . Diverticulitis   . Arthritis   . Hypertension   . Scoliosis       PSH: Past Surgical History  Procedure Laterality Date  . Abdominal hysterectomy    . Appendectomy    . Tonsillectomy    . Knee arthroscopy Right      CC:  Chief Complaint  Patient presents with  . Abdominal Pain     HPI: 82 year old female patient who receives her primary care in [place]. She has past medical history of hypertension, irritable bowel syndrome, chronic back pain as well as a recent diagnosis of inflammatory arthropathy for which she's recently received a cortisone injection in her left knee and had begun a pulse pack of prednisone for the past 2 days. Patient reports that beginning in February began having abdominal pain primarily in the left lower quadrant and her primary care physician initially treated this as uncompensated diverticulitis with oral antibiotics. Patient will feel better for a few days and then redevelop symptoms. On her second episode she was sent for CT scan and was told she had colitis or inflammation involving "the entire colon". He again she was given a course of antibiotics and improved for only a few days. Since February the patient has been treated a total of 3 times with antibiotics for diverticulitis and colitis. Over the past few days patient has had increasing lower quadrant abdominal pain and now is radiating to the back and has become more "jabbing" and different than previous. Does not she has to urinate all the time and also  feels like she must pass a bowel movement. She really hasn't eaten much over the past several weeks and has lost 7 pounds. She has been taking Vicodin recently with only minimal improvement in her pain symptoms.  ER Evaluation and treatment: Temp 97.9-BP 162/75-pulse 86-respirations 16, room air saturations 97% CT of the abdomen and pelvis with contrast: Previously seen inflammatory changes around the sigmoid colon have improved but there is a new 6 x 3.3 x 3.5 cm thick walled gas and fluid filled collection interposed between the sigmoid colon and the bladder which is suspicious for diverticular abscess. Large stool volume on a chronic basis based on comparison with previous imaging. Is also an incidental finding of a ovoid low-density area of the pancreatic neck measuring 2.1 cm that is likely consistent with a pancreatic mass-the radiologist's notes there is no malignant type enhancement or nodule recommendation to be reevaluated in 1 year by either MRI or CT Lab data: Na 136, K 4.6, BUN 16, Cr 0.70, glucose 114, albumin 3.3, WBCs 15,900 without differential being obtained, hemoglobin 15.9, platelets 323,000, urinalysis  abnormal with few bacteria negative nitrite trace leukocytes, wbc's 6-30 Zofran 4 mg IV 2 doses Normal saline bolus 250 mL Cipro 400 mg IV 1 Flagyl 500 mg IV 1 Fentanyl 25 g IV 1  Review of Systems   In addition to the HPI above,  No Fever-chills, myalgias or other constitutional symptoms No Headache, changes with Vision or hearing, new weakness, tingling, numbness in any extremity, No problems swallowing food or Liquids, indigestion/reflux No Chest pain, Cough or Shortness of Breath, palpitations, orthopnea or DOE No N/V; no melena or hematochezia, no dark tarry stools No dysuria, hematuria or flank pain No new skin rashes, lesions, masses or bruises, No new joints pains-aches No recent weight gain or loss No polyuria, polydypsia or polyphagia,  *A full 10 point  Review of Systems was done, except as stated above, all other Review of Systems were negative.  Social History Social History  Substance Use Topics  . Smoking status: Never Smoker   . Smokeless tobacco: Not on file  . Alcohol Use: No    Resides at: Private residence  Lives with: Alone  Ambulatory status:   Family History No family history on file. Father with prostate cancer   Prior to Admission medications   Medication Sig Start Date End Date Taking? Authorizing Provider  atenolol (TENORMIN) 50 MG tablet Take 50 mg by mouth daily.   Yes Historical Provider, MD  carisoprodol (SOMA) 350 MG tablet Take 350 mg by mouth 4 (four) times daily as needed for muscle spasms.   Yes Historical Provider, MD  doxazosin (CARDURA) 2 MG tablet Take 2 mg by mouth daily.   Yes Historical Provider, MD  gabapentin (NEURONTIN) 800 MG tablet Take 800 mg by mouth 3 (three) times daily.   Yes Historical Provider, MD  hydrochlorothiazide (HYDRODIURIL) 25 MG tablet Take 25 mg by mouth daily.   Yes Historical Provider, MD  hyoscyamine (ANASPAZ) 0.125 MG TBDP disintergrating tablet Place 0.125 mg under the tongue.   Yes Historical Provider, MD  raloxifene (EVISTA) 60 MG tablet Take 60 mg by mouth daily.   Yes Historical Provider, MD  ciprofloxacin (CIPRO) 500 MG tablet Take 1 tablet (500 mg total) by mouth 2 (two) times daily. 07/16/15   Quintella Reichert, MD  metroNIDAZOLE (FLAGYL) 500 MG tablet Take 1 tablet (500 mg total) by mouth 3 (three) times daily. 07/16/15   Quintella Reichert, MD  ondansetron (ZOFRAN) 4 MG tablet Take 1 tablet (4 mg total) by mouth every 6 (six) hours. 07/16/15   Quintella Reichert, MD    Allergies  Allergen Reactions  . Codeine Nausea And Vomiting  . Demerol [Meperidine] Nausea And Vomiting  . Dilaudid [Hydromorphone Hcl]     Hallucination   . Fentanyl     Hallucinations   . Morphine And Related     Nausea and vomiting   . Nsaids     Physical Exam  Vitals  Blood pressure  163/82, pulse 92, temperature 97.9 F (36.6 C), temperature source Oral, resp. rate 20, SpO2 97 %.   General:  In no acute distress, appears healthy and well nourished and younger than stated age  Psych:  Normal affect, Denies Suicidal or Homicidal ideations, Awake Alert, Oriented X 3. Speech and thought patterns are clear and appropriate, no apparent short term memory deficits  Neuro:   No focal neurological deficits, CN II through XII intact, Strength 5/5 all 4 extremities, Sensation intact all 4 extremities.  ENT:  Ears and Eyes appear Normal, Conjunctivae clear, PER. Moist  oral mucosa without erythema or exudates.  Neck:  Supple, No lymphadenopathy appreciated  Respiratory:  Symmetrical chest wall movement, Good air movement bilaterally, CTAB. Room Air  Cardiac:  RRR, No Murmurs, no LE edema noted, no JVD, No carotid bruits, peripheral pulses palpable at 2+  Abdomen:  Positive bowel sounds, Soft, Non tender, Non distended,  No masses appreciated, no obvious hepatosplenomegaly  Skin:  No Cyanosis, Normal Skin Turgor, No Skin Rash or Bruise.  Extremities: Symmetrical without obvious trauma or injury,  no effusions.  Data Review  CBC  Recent Labs Lab 08/24/15 1008  WBC 15.9*  HGB 11.9*  HCT 36.8  PLT 323  MCV 91.5  MCH 29.6  MCHC 32.3  RDW 13.5    Chemistries   Recent Labs Lab 08/24/15 1008  NA 136  K 4.6  CL 103  CO2 24  GLUCOSE 114*  BUN 16  CREATININE 0.70  CALCIUM 9.5  AST 19  ALT 16  ALKPHOS 58  BILITOT 0.4    CrCl cannot be calculated (Unknown ideal weight.).  No results for input(s): TSH, T4TOTAL, T3FREE, THYROIDAB in the last 72 hours.  Invalid input(s): FREET3  Coagulation profile No results for input(s): INR, PROTIME in the last 168 hours.  No results for input(s): DDIMER in the last 72 hours.  Cardiac Enzymes No results for input(s): CKMB, TROPONINI, MYOGLOBIN in the last 168 hours.  Invalid input(s): CK  Invalid input(s):  POCBNP  Urinalysis    Component Value Date/Time   COLORURINE YELLOW 08/24/2015 1250   APPEARANCEUR CLEAR 08/24/2015 1250   LABSPEC 1.015 08/24/2015 1250   PHURINE 6.5 08/24/2015 1250   GLUCOSEU NEGATIVE 08/24/2015 1250   HGBUR NEGATIVE 08/24/2015 1250   BILIRUBINUR NEGATIVE 08/24/2015 1250   KETONESUR NEGATIVE 08/24/2015 1250   PROTEINUR NEGATIVE 08/24/2015 1250   NITRITE NEGATIVE 08/24/2015 1250   LEUKOCYTESUR TRACE* 08/24/2015 1250    Imaging results:   Ct Abdomen Pelvis W Contrast  08/24/2015  CLINICAL DATA:  Left lower quadrant abdominal pain EXAM: CT ABDOMEN AND PELVIS WITH CONTRAST TECHNIQUE: Multidetector CT imaging of the abdomen and pelvis was performed using the standard protocol following bolus administration of intravenous contrast. CONTRAST:  1 ISOVUE-300 IOPAMIDOL (ISOVUE-300) INJECTION 61% COMPARISON:  07/16/2015 FINDINGS: Lower chest and abdominal wall: Fatty Bochdalek's hernia on the right Hepatobiliary: Cystic density segment 2 lesion measuring 13 mm. Coarse parenchymal calcifications considered incidental.No evidence of biliary obstruction or stone. Pancreas: Ovoid low-density at the pancreatic neck measuring 21 mm in axial dimension, favor cystic pancreatic mass over focal dilatation the common bile duct given the normal diameter distally the pancreatic head. No malignant type enhancement or nodule. Spleen: Unremarkable. Adrenals/Urinary Tract: Negative adrenals. No hydronephrosis or stone. 1 cm upper pole cyst on the left. Right renal cortical scarring and thinning. Distorted urinary bladder via abscess. No pneumaturia or bladder wall thickening to suggest fistula. Reproductive:Hysterectomy. Stomach/Bowel: Inflammatory changes around the sigmoid colon have improved, but there is a new 60 x 33 x 35 mm thick walled gas and fluid collection interposed between the sigmoid colon and bladder. Given previous findings and extensive diverticulosis, this is likely diverticular abscess.  Large stool volume on a chronic basis. No pericecal inflammation. Vascular/Lymphatic: No acute vascular abnormality. No mass or adenopathy. Peritoneal: No ascites or pneumoperitoneum. Musculoskeletal: No acute abnormalities. Advanced disc and facet degeneration. IMPRESSION: 1. 6 cm abscess between the sigmoid colon and bladder, presumably diverticular given recent CT findings. 2. 21 mm cystic pancreatic head mass which could be re-evaluated in  1 year by MRI or CT. Additional chronic findings are described above Electronically Signed   By: Monte Fantasia M.D.   On: 08/24/2015 14:35     Assessment & Plan  Principal Problem:   Diverticulitis of colon with perforation/Intra-abdominal abscess  -Has had a total of 3 courses of antibiotics since February with recurrence of diverticulitis and now has abscess -Admit to floor/Inpatient -Appreciate surgical and interventional radiology consultations -Plan is to pursue percutaneous drainage of abscess tomorrow -Continue Cipro and Flagyl IV -Bowel rest -IV morphine for pain-patient clarified that she primarily has nausea with these medications and truly isn't allergic and does better if given anti-emetics prophylactically -I have started scheduled Zofran and will allow when necessary Compazine -IV fluids -Follow-up on percutaneous drain cultures -I have also ordered blood cultures  Active Problems:   HTN  -Not well controlled but likely been influenced by ongoing abdominal pain -We'll hold oral medications today but likely can resume with a sip of water tomorrow    Abnormal urinalysis -Likely related to proximetry to intra-abdominal abscess -Follow up on urine culture -Above ordered antibiotics would cover for UTI    ?? Pancreatic mass -Radiologist states appears cystic in nature and does not appear to be malignant in nature and recommends follow-up in one year with either CT or MRI    IBS -Currently asymptomatic     Inflammatory arthropathy   -New diagnosis and recently has undergone cortisone injection to the left knee -Started pulsed dose prednisone 2 days ago but we have stopped this in setting of acute infectious process    Chronic back pain    DVT Prophylaxis: SCDs until percutaneous drain placed at request of interventional radiology  Family Communication: Daughter at bedside -also patient's son updated via telephone per attending physician  Code Status:  Full code  Condition:  Stable  Discharge disposition: Once medically stable anticipate discharge back to home-likely will discharge with percutaneous drain in place so will need to have home health RN to follow  Time spent in minutes : 60      ELLIS,ALLISON L. ANP on 08/24/2015 at 4:37 PM  You may contact me by going to [email or web address] - password [email or web address]  I am available from 7a-7p but please confirm I am on the schedule by going to Amion as above.   After 7p please contact night coverage person covering me after hours  [facility]

## 2015-08-24 NOTE — ED Provider Notes (Signed)
CSN: [id number]     Arrival date & time 08/24/15  0931 History   First MD Initiated Contact with Patient 08/24/15 1113     Chief Complaint  Patient presents with  . Abdominal Pain     (Consider location/radiation/quality/duration/timing/severity/associated sxs/prior Treatment) The history is provided by the patient and a relative.   82 year old female with complaint of lower quadrant abdominal pain on and off for the past 6 months. Patient had CT scan done in February that was consistent with colitis. Patient's also had diverticulitis in the past. Patient's had increased pain in the left lower quadrant in the past week. Not associated with nausea vomiting or diarrhea. Patient last completed a course of antibiotics for diverticulitis presumably 2 weeks ago. Patient is still taking prednisone. Patient also states that she's had decreased appetite and unexpected weight loss. And fatigue. Denies fevers.  Past Medical History  Diagnosis Date  . Diverticulitis   . Arthritis   . Hypertension   . Scoliosis    Past Surgical History  Procedure Laterality Date  . Abdominal hysterectomy    . Appendectomy    . Tonsillectomy    . Knee arthroscopy Right    No family history on file. Social History  Substance Use Topics  . Smoking status: Never Smoker   . Smokeless tobacco: None  . Alcohol Use: No   OB History    No data available     Review of Systems  Constitutional: Positive for appetite change, fatigue and unexpected weight change.  HENT: Negative for congestion.   Eyes: Negative for visual disturbance.  Respiratory: Negative for shortness of breath.   Cardiovascular: Negative for chest pain.  Gastrointestinal: Positive for abdominal pain. Negative for nausea, vomiting and diarrhea.  Genitourinary: Negative for dysuria.  Musculoskeletal: Negative for back pain.  Skin: Negative for rash.  Neurological: Negative for headaches.  Hematological: Does not bruise/bleed easily.   Psychiatric/Behavioral: Negative for confusion.      Allergies  Codeine; Demerol; Dilaudid; Fentanyl; Morphine and related; and Nsaids  Home Medications   Prior to Admission medications   Medication Sig Start Date End Date Taking? Authorizing Provider  atenolol (TENORMIN) 50 MG tablet Take 50 mg by mouth daily.   Yes Historical Provider, MD  carisoprodol (SOMA) 350 MG tablet Take 350 mg by mouth 4 (four) times daily as needed for muscle spasms.   Yes Historical Provider, MD  doxazosin (CARDURA) 2 MG tablet Take 2 mg by mouth daily.   Yes Historical Provider, MD  gabapentin (NEURONTIN) 800 MG tablet Take 800 mg by mouth 3 (three) times daily.   Yes Historical Provider, MD  hydrochlorothiazide (HYDRODIURIL) 25 MG tablet Take 25 mg by mouth daily.   Yes Historical Provider, MD  hyoscyamine (ANASPAZ) 0.125 MG TBDP disintergrating tablet Place 0.125 mg under the tongue.   Yes Historical Provider, MD  raloxifene (EVISTA) 60 MG tablet Take 60 mg by mouth daily.   Yes Historical Provider, MD  ciprofloxacin (CIPRO) 500 MG tablet Take 1 tablet (500 mg total) by mouth 2 (two) times daily. 07/16/15   Quintella Reichert, MD  metroNIDAZOLE (FLAGYL) 500 MG tablet Take 1 tablet (500 mg total) by mouth 3 (three) times daily. 07/16/15   Quintella Reichert, MD  ondansetron (ZOFRAN) 4 MG tablet Take 1 tablet (4 mg total) by mouth every 6 (six) hours. 07/16/15   Quintella Reichert, MD   BP 124/89 mmHg  Pulse 89  Temp(Src) 97.9 F (36.6 C) (Oral)  Resp 18  SpO2 95% Physical  Exam  Constitutional: She is oriented to person, place, and time. She appears well-developed and well-nourished. No distress.  HENT:  Head: Normocephalic and atraumatic.  Mouth/Throat: Oropharynx is clear and moist.  Eyes: Conjunctivae and EOM are normal. Pupils are equal, round, and reactive to light.  Neck: Normal range of motion. Neck supple.  Cardiovascular: Normal rate, regular rhythm and normal heart sounds.   No murmur  heard. Pulmonary/Chest: Effort normal and breath sounds normal. No respiratory distress.  Abdominal: Soft. Bowel sounds are normal. There is tenderness.  Tender left lower quadrant no guarding.  Musculoskeletal: Normal range of motion.  Neurological: She is alert and oriented to person, place, and time. No cranial nerve deficit. She exhibits normal muscle tone. Coordination normal.  Skin: Skin is warm. No rash noted.  Nursing note and vitals reviewed.   ED Course  Procedures (including critical care time) Labs Review Labs Reviewed  COMPREHENSIVE METABOLIC PANEL - Abnormal; Notable for the following:    Glucose, Bld 114 (*)    Albumin 3.3 (*)    All other components within normal limits  CBC - Abnormal; Notable for the following:    WBC 15.9 (*)    Hemoglobin 11.9 (*)    All other components within normal limits  URINALYSIS, ROUTINE W REFLEX MICROSCOPIC (NOT AT [facility]) - Abnormal; Notable for the following:    Leukocytes, UA TRACE (*)    All other components within normal limits  URINE MICROSCOPIC-ADD ON - Abnormal; Notable for the following:    Squamous Epithelial / LPF 0-5 (*)    Bacteria, UA FEW (*)    All other components within normal limits  LIPASE, BLOOD   Results for orders placed or performed during the hospital encounter of 08/24/15  Lipase, blood  Result Value Ref Range   Lipase 19 11 - 51 U/L  Comprehensive metabolic panel  Result Value Ref Range   Sodium 136 135 - 145 mmol/L   Potassium 4.6 3.5 - 5.1 mmol/L   Chloride 103 101 - 111 mmol/L   CO2 24 22 - 32 mmol/L   Glucose, Bld 114 (H) 65 - 99 mg/dL   BUN 16 6 - 20 mg/dL   Creatinine, Ser 0.70 0.44 - 1.00 mg/dL   Calcium 9.5 8.9 - 10.3 mg/dL   Total Protein 7.0 6.5 - 8.1 g/dL   Albumin 3.3 (L) 3.5 - 5.0 g/dL   AST 19 15 - 41 U/L   ALT 16 14 - 54 U/L   Alkaline Phosphatase 58 38 - 126 U/L   Total Bilirubin 0.4 0.3 - 1.2 mg/dL   GFR calc non Af Amer >60 >60 mL/min   GFR calc Af Amer >60 >60 mL/min   Anion  gap 9 5 - 15  CBC  Result Value Ref Range   WBC 15.9 (H) 4.0 - 10.5 K/uL   RBC 4.02 3.87 - 5.11 MIL/uL   Hemoglobin 11.9 (L) 12.0 - 15.0 g/dL   HCT 36.8 36.0 - 46.0 %   MCV 91.5 78.0 - 100.0 fL   MCH 29.6 26.0 - 34.0 pg   MCHC 32.3 30.0 - 36.0 g/dL   RDW 13.5 11.5 - 15.5 %   Platelets 323 150 - 400 K/uL  Urinalysis, Routine w reflex microscopic (not at [facility])  Result Value Ref Range   Color, Urine YELLOW YELLOW   APPearance CLEAR CLEAR   Specific Gravity, Urine 1.015 1.005 - 1.030   pH 6.5 5.0 - 8.0   Glucose, UA NEGATIVE NEGATIVE mg/dL  Hgb urine dipstick NEGATIVE NEGATIVE   Bilirubin Urine NEGATIVE NEGATIVE   Ketones, ur NEGATIVE NEGATIVE mg/dL   Protein, ur NEGATIVE NEGATIVE mg/dL   Nitrite NEGATIVE NEGATIVE   Leukocytes, UA TRACE (A) NEGATIVE  Urine microscopic-add on  Result Value Ref Range   Squamous Epithelial / LPF 0-5 (A) NONE SEEN   WBC, UA 6-30 0 - 5 WBC/hpf   RBC / HPF 0-5 0 - 5 RBC/hpf   Bacteria, UA FEW (A) NONE SEEN     Imaging Review Ct Abdomen Pelvis W Contrast  08/24/2015  CLINICAL DATA:  Left lower quadrant abdominal pain EXAM: CT ABDOMEN AND PELVIS WITH CONTRAST TECHNIQUE: Multidetector CT imaging of the abdomen and pelvis was performed using the standard protocol following bolus administration of intravenous contrast. CONTRAST:  1 ISOVUE-300 IOPAMIDOL (ISOVUE-300) INJECTION 61% COMPARISON:  07/16/2015 FINDINGS: Lower chest and abdominal wall: Fatty Bochdalek's hernia on the right Hepatobiliary: Cystic density segment 2 lesion measuring 13 mm. Coarse parenchymal calcifications considered incidental.No evidence of biliary obstruction or stone. Pancreas: Ovoid low-density at the pancreatic neck measuring 21 mm in axial dimension, favor cystic pancreatic mass over focal dilatation the common bile duct given the normal diameter distally the pancreatic head. No malignant type enhancement or nodule. Spleen: Unremarkable. Adrenals/Urinary Tract: Negative adrenals. No  hydronephrosis or stone. 1 cm upper pole cyst on the left. Right renal cortical scarring and thinning. Distorted urinary bladder via abscess. No pneumaturia or bladder wall thickening to suggest fistula. Reproductive:Hysterectomy. Stomach/Bowel: Inflammatory changes around the sigmoid colon have improved, but there is a new 60 x 33 x 35 mm thick walled gas and fluid collection interposed between the sigmoid colon and bladder. Given previous findings and extensive diverticulosis, this is likely diverticular abscess. Large stool volume on a chronic basis. No pericecal inflammation. Vascular/Lymphatic: No acute vascular abnormality. No mass or adenopathy. Peritoneal: No ascites or pneumoperitoneum. Musculoskeletal: No acute abnormalities. Advanced disc and facet degeneration. IMPRESSION: 1. 6 cm abscess between the sigmoid colon and bladder, presumably diverticular given recent CT findings. 2. 21 mm cystic pancreatic head mass which could be re-evaluated in 1 year by MRI or CT. Additional chronic findings are described above Electronically Signed   By: Monte Fantasia M.D.   On: 08/24/2015 14:35   I have personally reviewed and evaluated these images and lab results as part of my medical decision-making.   EKG Interpretation None      MDM   Final diagnoses:  Diverticulitis of large intestine with abscess without bleeding     Patient was abdominal symptoms for the past 6 months. But had increase the pain in the left lower quadrant over the past several days. CT scans consistent with a 6 cm abscess. Most likely related to diverticulitis. Patient has a leukocytosis. Has been on prednisone. Has not been on antibiotics for 2 weeks. No nausea no vomiting no diarrhea associated with it.   Patient will be admitted by hospitalist service. General surgery has been consult it. Patient will probably undergo CT directed drainage of the abscess. Patient started on antibiotics for diverticulitis. Flagyl and  Cipro.    Fredia Sorrow, MD 08/24/15 [phone]

## 2015-08-24 NOTE — ED Notes (Signed)
Pt c/o L lower bad intermittent sharp pain with bil abd tenderness onset today, pt reports abx tx x 3 over the last 6 mths, pt denies n/v/d, pt hx of diverticulosis & colitis, pt A&O x4

## 2015-08-24 NOTE — ED Notes (Signed)
Attempted report 

## 2015-08-25 ENCOUNTER — Encounter (HOSPITAL_COMMUNITY): Payer: Self-pay | Admitting: General Practice

## 2015-08-25 ENCOUNTER — Inpatient Hospital Stay (HOSPITAL_COMMUNITY): Payer: Medicare Other

## 2015-08-25 DIAGNOSIS — K5720 Diverticulitis of large intestine with perforation and abscess without bleeding: Principal | ICD-10-CM

## 2015-08-25 DIAGNOSIS — K869 Disease of pancreas, unspecified: Secondary | ICD-10-CM

## 2015-08-25 DIAGNOSIS — I10 Essential (primary) hypertension: Secondary | ICD-10-CM

## 2015-08-25 DIAGNOSIS — K651 Peritoneal abscess: Secondary | ICD-10-CM

## 2015-08-25 HISTORY — PX: CT GUIDE ABSCESS DRAINAGE  (ARMC HX): HXRAD1395

## 2015-08-25 LAB — COMPREHENSIVE METABOLIC PANEL
ALT: 19 U/L (ref 14–54)
ANION GAP: 8 (ref 5–15)
AST: 21 U/L (ref 15–41)
Albumin: 2.8 g/dL — ABNORMAL LOW (ref 3.5–5.0)
Alkaline Phosphatase: 47 U/L (ref 38–126)
BUN: 14 mg/dL (ref 6–20)
CHLORIDE: 102 mmol/L (ref 101–111)
CO2: 24 mmol/L (ref 22–32)
Calcium: 8.4 mg/dL — ABNORMAL LOW (ref 8.9–10.3)
Creatinine, Ser: 0.8 mg/dL (ref 0.44–1.00)
GFR calc non Af Amer: >60 mL/min (ref >60)
Glucose, Bld: 112 mg/dL — ABNORMAL HIGH (ref 65–99)
Potassium: 3.7 mmol/L (ref 3.5–5.1)
SODIUM: 134 mmol/L — AB (ref 135–145)
Total Bilirubin: 0.6 mg/dL (ref 0.3–1.2)
Total Protein: 5.9 g/dL — ABNORMAL LOW (ref 6.5–8.1)

## 2015-08-25 LAB — CBC
HCT: 32.9 % — ABNORMAL LOW (ref 36.0–46.0)
HEMOGLOBIN: 11.1 g/dL — AB (ref 12.0–15.0)
MCH: 31.2 pg (ref 26.0–34.0)
MCHC: 33.7 g/dL (ref 30.0–36.0)
MCV: 92.4 fL (ref 78.0–100.0)
Platelets: 290 10*3/uL (ref 150–400)
RBC: 3.56 MIL/uL — AB (ref 3.87–5.11)
RDW: 13.7 % (ref 11.5–15.5)
WBC: 12.3 10*3/uL — ABNORMAL HIGH (ref 4.0–10.5)

## 2015-08-25 LAB — URINE CULTURE

## 2015-08-25 MED ORDER — FENTANYL CITRATE (PF) 100 MCG/2ML IJ SOLN
INTRAMUSCULAR | Status: AC
  Filled 2015-08-25: qty 2

## 2015-08-25 MED ORDER — MIDAZOLAM HCL 2 MG/2ML IJ SOLN
INTRAMUSCULAR | Status: AC
  Filled 2015-08-25: qty 2

## 2015-08-25 MED ORDER — MIDAZOLAM HCL 2 MG/2ML IJ SOLN
INTRAMUSCULAR | Status: AC | PRN
  Administered 2015-08-25: 0.5 mg via INTRAVENOUS
  Administered 2015-08-25: 1 mg via INTRAVENOUS
  Administered 2015-08-25: 0.5 mg via INTRAVENOUS

## 2015-08-25 MED ORDER — BOOST / RESOURCE BREEZE PO LIQD
1.0000 | Freq: Three times a day (TID) | ORAL | Status: DC
  Administered 2015-08-25: 18:00:00 via ORAL
  Administered 2015-08-25 – 2015-08-26 (×4): 1 via ORAL

## 2015-08-25 MED ORDER — LIDOCAINE HCL 1 % IJ SOLN
INTRAMUSCULAR | Status: AC
  Filled 2015-08-25: qty 20

## 2015-08-25 MED ORDER — FENTANYL CITRATE (PF) 100 MCG/2ML IJ SOLN
INTRAMUSCULAR | Status: AC | PRN
  Administered 2015-08-25 (×4): 25 ug via INTRAVENOUS

## 2015-08-25 NOTE — Sedation Documentation (Signed)
Pt grimacing 

## 2015-08-25 NOTE — Progress Notes (Signed)
Subjective: S/p IR drain today. Abdominal pain less  Objective: Vital signs in last 24 hours: Temp:  [98 F (36.7 C)-98.5 F (36.9 C)] 98 F (36.7 C) (04/07 0548) Pulse Rate:  [78-92] 90 (04/07 1043) Resp:  [10-22] 20 (04/07 1043) BP: (112-164)/(45-96) 125/45 mmHg (04/07 1043) SpO2:  [92 %-100 %] 100 % (04/07 1043) Last BM Date: 08/25/15  Intake/Output from previous day: 04/06 0701 - 04/07 0700 In: -  Out: 100 [Urine:100] Intake/Output this shift:    Abdomen soft, non-tender currently  Lab Results:   Recent Labs  08/24/15 1008 08/25/15 0546  WBC 15.9* 12.3*  HGB 11.9* 11.1*  HCT 36.8 32.9*  PLT 323 290   BMET  Recent Labs  08/24/15 1008 08/25/15 0546  NA 136 134*  K 4.6 3.7  CL 103 102  CO2 24 24  GLUCOSE 114* 112*  BUN 16 14  CREATININE 0.70 0.80  CALCIUM 9.5 8.4*   PT/INR  Recent Labs  08/24/15 1704  LABPROT 14.3  INR 1.09   ABG No results for input(s): PHART, HCO3 in the last 72 hours.  Invalid input(s): PCO2, PO2  Studies/Results: Ct Abdomen Pelvis W Contrast  08/24/2015  CLINICAL DATA:  Left lower quadrant abdominal pain EXAM: CT ABDOMEN AND PELVIS WITH CONTRAST TECHNIQUE: Multidetector CT imaging of the abdomen and pelvis was performed using the standard protocol following bolus administration of intravenous contrast. CONTRAST:  1 ISOVUE-300 IOPAMIDOL (ISOVUE-300) INJECTION 61% COMPARISON:  07/16/2015 FINDINGS: Lower chest and abdominal wall: Fatty Bochdalek's hernia on the right Hepatobiliary: Cystic density segment 2 lesion measuring 13 mm. Coarse parenchymal calcifications considered incidental.No evidence of biliary obstruction or stone. Pancreas: Ovoid low-density at the pancreatic neck measuring 21 mm in axial dimension, favor cystic pancreatic mass over focal dilatation the common bile duct given the normal diameter distally the pancreatic head. No malignant type enhancement or nodule. Spleen: Unremarkable. Adrenals/Urinary Tract:  Negative adrenals. No hydronephrosis or stone. 1 cm upper pole cyst on the left. Right renal cortical scarring and thinning. Distorted urinary bladder via abscess. No pneumaturia or bladder wall thickening to suggest fistula. Reproductive:Hysterectomy. Stomach/Bowel: Inflammatory changes around the sigmoid colon have improved, but there is a new 60 x 33 x 35 mm thick walled gas and fluid collection interposed between the sigmoid colon and bladder. Given previous findings and extensive diverticulosis, this is likely diverticular abscess. Large stool volume on a chronic basis. No pericecal inflammation. Vascular/Lymphatic: No acute vascular abnormality. No mass or adenopathy. Peritoneal: No ascites or pneumoperitoneum. Musculoskeletal: No acute abnormalities. Advanced disc and facet degeneration. IMPRESSION: 1. 6 cm abscess between the sigmoid colon and bladder, presumably diverticular given recent CT findings. 2. 21 mm cystic pancreatic head mass which could be re-evaluated in 1 year by MRI or CT. Additional chronic findings are described above Electronically Signed   By: Monte Fantasia M.D.   On: 08/24/2015 14:35   Ct Image Guided Drainage By Percutaneous Catheter  08/25/2015  INDICATION: 82 year old with diverticular pelvic abscess. EXAM: CT GUIDED DRAINAGE OF PELVIC ABSCESS MEDICATIONS: The patient is currently admitted to the hospital and receiving intravenous antibiotics. The antibiotics were administered within an appropriate time frame prior to the initiation of the procedure. ANESTHESIA/SEDATION: 2.0 mg IV Versed 100 mcg IV Fentanyl Moderate Sedation Time:  44 minutes The patient was continuously monitored during the procedure by the interventional radiology nurse under my direct supervision. COMPLICATIONS: None immediate. TECHNIQUE: Informed written consent was obtained from the patient after a thorough discussion of the procedural risks, benefits  and alternatives. All questions were addressed. Maximal  Sterile Barrier Technique was utilized including caps, mask, sterile gowns, sterile gloves, sterile drape, hand hygiene and skin antiseptic. A timeout was performed prior to the initiation of the procedure. PROCEDURE: Patient was initially placed in supine position and CT images through the pelvis were obtained. A safe percutaneous window was not identified for drainage of the abscess from an anterior approach. Subsequently, patient was placed prone and repeat images through the pelvis were obtained. A percutaneous window from the left transgluteal approach was selected. The left side of the back was prepped with chlorhexidine and a sterile field was created. Skin was anesthetized with 1% lidocaine. Using CT guidance, an 18 gauge trocar needle was directed into the air-fluid collection just above the vaginal cuff. Needle was advanced into the air-fluid collection. Fluid was unable to be aspirated from this needle. A wire was advanced to confirm placement within the abscess. The tract was dilated to accommodate a 10.2 [place] multipurpose drain. 20 mL of clay colored thick fluid was removed. Follow up CT images were obtained. Catheter was sutured to the skin and a bandage was placed. Catheter was attached to a suction bulb. FINDINGS: Air-fluid collection anterior to the rectum and just above the vaginal cuff. Catheter was successfully placed within this collection from a left transgluteal approach. Decompression of the abscess after catheter placement. 20 mL of thick gray colored fluid was removed. Fluid was sent for culture. IMPRESSION: Successful CT-guided drain placement in the pelvic abscess collection. Electronically Signed   By: Markus Daft M.D.   On: 08/25/2015 12:43    Anti-infectives: Anti-infectives    Start     Dose/Rate Route Frequency Ordered Stop   08/25/15 0330  ciprofloxacin (CIPRO) IVPB 400 mg     400 mg 200 mL/hr over 60 Minutes Intravenous Every 12 hours 08/24/15 1751     08/25/15 0130   metroNIDAZOLE (FLAGYL) IVPB 500 mg     500 mg 100 mL/hr over 60 Minutes Intravenous Every 8 hours 08/24/15 1751     08/24/15 1515  ciprofloxacin (CIPRO) IVPB 400 mg     400 mg 200 mL/hr over 60 Minutes Intravenous  Once 08/24/15 1509 08/24/15 1657   08/24/15 1515  metroNIDAZOLE (FLAGYL) IVPB 500 mg     500 mg 100 mL/hr over 60 Minutes Intravenous  Once 08/24/15 1509 08/24/15 1834      Assessment/Plan: s/p * No surgery found *  Diverticular abscess s/p IR drain   Will continue antibiotics and drain. Clear liquid diet started  LOS: 1 day    Kayal Mula A 08/25/2015

## 2015-08-25 NOTE — Sedation Documentation (Signed)
Patient is grimacing, state she is in pain at drain site.

## 2015-08-25 NOTE — Sedation Documentation (Signed)
Patient is resting comfortably. Vitals stable. 

## 2015-08-25 NOTE — Sedation Documentation (Signed)
Patient is resting. Vitals stable. 

## 2015-08-25 NOTE — Sedation Documentation (Signed)
Patient is resting comfortably. 

## 2015-08-25 NOTE — Sedation Documentation (Signed)
Patient is resting comfortably. Vital signs stable. Pt denies pain.

## 2015-08-25 NOTE — Procedures (Signed)
CT guided drainage of pelvic diverticular abscess.  Placed 10 French drain.  Removed 20 ml of clay colored fluid.  Will send fluid for culture.  No immediate complication.

## 2015-08-25 NOTE — Progress Notes (Signed)
PROGRESS NOTE  Elizabeth Mullins [id number] DOB: 19-Feb-1932 DOA: 08/24/2015 PCP: Idamae Schuller, MD  HPI/Recap of past 67 hours: 82 year old female with past medical history ofdiverticulitis and hypertension admitted on 4/6 with several days of nausea vomiting and left lower quadrant abdominal pain.In the emergency room, noted to have white count of 15.9 and CT of imaging notes cystic mass in pancreas as well assigns of diverticulitis with secondary abscess. Seen by general surgery and interventional radiology and patient underwent CT image guided drainage on morning of 4/7.  Patient admitted to hospitalist service on IV antibiotics.  Seen after drainage. Feeling somewhat better, although some significant tenderness in the left lower quadrant.  Assessment/Plan: Principal Problem:   Diverticulitis of colon with perforationwith secondary intra-abdominal abscess: Status post drain by interventional radiology. Continue IV antibiotics. Change to by mouth and advance diet once white count results. Active Problems:    HTN (hypertension): Blood pressure stable    ?? Pancreatic mass: Radiologist states appears cystic in nature and does not appear to be malignant in nature and recommends follow-up in one year with either CT or MRI   IBS (irritable bowel syndrome)   Inflammatory arthropathy (HCC)   Chronic back pain   Code Status: Full code  Family Communication: daughter at the bedside   Disposition Plan: Home in next 1-2 days, depending on pain, by mouth intake and white blood cell count    Consultants:  Interventional radiology  General surgery   Procedures:  CT-guided drainage of pelvic diverticular abscess with placement of drain  Antibiotics:  IV Cipro and Flagyl 4/6-present    Objective: BP 125/45 mmHg  Pulse 90  Temp(Src) 98 F (36.7 C) (Oral)  Resp 20  SpO2 100%  Intake/Output Summary (Last 24 hours) at 08/25/15 1249 Last data filed at 08/25/15 0900  Gross per 24  hour  Intake      0 ml  Output    100 ml  Net   -100 ml   There were no vitals filed for this visit.  Exam:   General:  Alert and oriented 3, mild distress secondary to pain   Cardiovascular: regular rate and rhythm, S1-S2   Respiratory: clear to auscultation bilaterally   Abdomen: soft, mild tenderness left lower quadrant, drain noted   Musculoskeletal: o clubbing or cyanosis or edema    Data Reviewed: Basic Metabolic Panel:  Recent Labs Lab 08/24/15 1008 08/24/15 1820 08/25/15 0546  NA 136  --  134*  K 4.6  --  3.7  CL 103  --  102  CO2 24  --  24  GLUCOSE 114*  --  112*  BUN 16  --  14  CREATININE 0.70  --  0.80  CALCIUM 9.5  --  8.4*  MG  --  2.1  --   PHOS  --  2.8  --    Liver Function Tests:  Recent Labs Lab 08/24/15 1008 08/25/15 0546  AST 19 21  ALT 16 19  ALKPHOS 58 47  BILITOT 0.4 0.6  PROT 7.0 5.9*  ALBUMIN 3.3* 2.8*    Recent Labs Lab 08/24/15 1008  LIPASE 19   No results for input(s): AMMONIA in the last 168 hours. CBC:  Recent Labs Lab 08/24/15 1008 08/25/15 0546  WBC 15.9* 12.3*  HGB 11.9* 11.1*  HCT 36.8 32.9*  MCV 91.5 92.4  PLT 323 290   Cardiac Enzymes:   No results for input(s): CKTOTAL, CKMB, CKMBINDEX, TROPONINI in the last 168 hours. BNP (last 3  results) No results for input(s): BNP in the last 8760 hours.  ProBNP (last 3 results) No results for input(s): PROBNP in the last 8760 hours.  CBG: No results for input(s): GLUCAP in the last 168 hours.  Recent Results (from the past 240 hour(s))  Urine culture     Status: None (Preliminary result)   Collection Time: 08/24/15 12:50 PM  Result Value Ref Range Status   Specimen Description URINE, RANDOM  Final   Special Requests NONE  Final   Culture TOO YOUNG TO READ  Final   Report Status PENDING  Incomplete     Studies: Ct Abdomen Pelvis W Contrast  08/24/2015  CLINICAL DATA:  Left lower quadrant abdominal pain EXAM: CT ABDOMEN AND PELVIS WITH CONTRAST  TECHNIQUE: Multidetector CT imaging of the abdomen and pelvis was performed using the standard protocol following bolus administration of intravenous contrast. CONTRAST:  1 ISOVUE-300 IOPAMIDOL (ISOVUE-300) INJECTION 61% COMPARISON:  07/16/2015 FINDINGS: Lower chest and abdominal wall: Fatty Bochdalek's hernia on the right Hepatobiliary: Cystic density segment 2 lesion measuring 13 mm. Coarse parenchymal calcifications considered incidental.No evidence of biliary obstruction or stone. Pancreas: Ovoid low-density at the pancreatic neck measuring 21 mm in axial dimension, favor cystic pancreatic mass over focal dilatation the common bile duct given the normal diameter distally the pancreatic head. No malignant type enhancement or nodule. Spleen: Unremarkable. Adrenals/Urinary Tract: Negative adrenals. No hydronephrosis or stone. 1 cm upper pole cyst on the left. Right renal cortical scarring and thinning. Distorted urinary bladder via abscess. No pneumaturia or bladder wall thickening to suggest fistula. Reproductive:Hysterectomy. Stomach/Bowel: Inflammatory changes around the sigmoid colon have improved, but there is a new 60 x 33 x 35 mm thick walled gas and fluid collection interposed between the sigmoid colon and bladder. Given previous findings and extensive diverticulosis, this is likely diverticular abscess. Large stool volume on a chronic basis. No pericecal inflammation. Vascular/Lymphatic: No acute vascular abnormality. No mass or adenopathy. Peritoneal: No ascites or pneumoperitoneum. Musculoskeletal: No acute abnormalities. Advanced disc and facet degeneration. IMPRESSION: 1. 6 cm abscess between the sigmoid colon and bladder, presumably diverticular given recent CT findings. 2. 21 mm cystic pancreatic head mass which could be re-evaluated in 1 year by MRI or CT. Additional chronic findings are described above Electronically Signed   By: Monte Fantasia M.D.   On: 08/24/2015 14:35   Ct Image Guided  Drainage By Percutaneous Catheter  08/25/2015  INDICATION: 82 year old with diverticular pelvic abscess. EXAM: CT GUIDED DRAINAGE OF PELVIC ABSCESS MEDICATIONS: The patient is currently admitted to the hospital and receiving intravenous antibiotics. The antibiotics were administered within an appropriate time frame prior to the initiation of the procedure. ANESTHESIA/SEDATION: 2.0 mg IV Versed 100 mcg IV Fentanyl Moderate Sedation Time:  44 minutes The patient was continuously monitored during the procedure by the interventional radiology nurse under my direct supervision. COMPLICATIONS: None immediate. TECHNIQUE: Informed written consent was obtained from the patient after a thorough discussion of the procedural risks, benefits and alternatives. All questions were addressed. Maximal Sterile Barrier Technique was utilized including caps, mask, sterile gowns, sterile gloves, sterile drape, hand hygiene and skin antiseptic. A timeout was performed prior to the initiation of the procedure. PROCEDURE: Patient was initially placed in supine position and CT images through the pelvis were obtained. A safe percutaneous window was not identified for drainage of the abscess from an anterior approach. Subsequently, patient was placed prone and repeat images through the pelvis were obtained. A percutaneous window from the left transgluteal  approach was selected. The left side of the back was prepped with chlorhexidine and a sterile field was created. Skin was anesthetized with 1% lidocaine. Using CT guidance, an 18 gauge trocar needle was directed into the air-fluid collection just above the vaginal cuff. Needle was advanced into the air-fluid collection. Fluid was unable to be aspirated from this needle. A wire was advanced to confirm placement within the abscess. The tract was dilated to accommodate a 10.2 [place] multipurpose drain. 20 mL of clay colored thick fluid was removed. Follow up CT images were obtained. Catheter was  sutured to the skin and a bandage was placed. Catheter was attached to a suction bulb. FINDINGS: Air-fluid collection anterior to the rectum and just above the vaginal cuff. Catheter was successfully placed within this collection from a left transgluteal approach. Decompression of the abscess after catheter placement. 20 mL of thick gray colored fluid was removed. Fluid was sent for culture. IMPRESSION: Successful CT-guided drain placement in the pelvic abscess collection. Electronically Signed   By: Markus Daft M.D.   On: 08/25/2015 12:43    Scheduled Meds: . atenolol  50 mg Oral Daily  . ciprofloxacin  400 mg Intravenous Q12H  . doxazosin  2 mg Oral Daily  . fentaNYL      . lidocaine      . metronidazole  500 mg Intravenous Q8H  . midazolam      . ondansetron (ZOFRAN) IV  4 mg Intravenous 4 times per day    Continuous Infusions: . sodium chloride 100 mL/hr at 08/24/15 2314     Time spent: 15 minutes   [facility] Pager [phone] . If 7PM-7AM, please contact night-coverage at [email or web address], password [facility] 08/25/2015, 12:49 PM  LOS: 1 day

## 2015-08-26 LAB — CBC
HEMATOCRIT: 33.9 % — AB (ref 36.0–46.0)
Hemoglobin: 10.7 g/dL — ABNORMAL LOW (ref 12.0–15.0)
MCH: 29.2 pg (ref 26.0–34.0)
MCHC: 31.6 g/dL (ref 30.0–36.0)
MCV: 92.4 fL (ref 78.0–100.0)
Platelets: 320 10*3/uL (ref 150–400)
RBC: 3.67 MIL/uL — AB (ref 3.87–5.11)
RDW: 13.6 % (ref 11.5–15.5)
WBC: 9 10*3/uL (ref 4.0–10.5)

## 2015-08-26 LAB — BASIC METABOLIC PANEL
Anion gap: 10 (ref 5–15)
BUN: 9 mg/dL (ref 6–20)
CHLORIDE: 102 mmol/L (ref 101–111)
CO2: 26 mmol/L (ref 22–32)
CREATININE: 0.65 mg/dL (ref 0.44–1.00)
Calcium: 8.5 mg/dL — ABNORMAL LOW (ref 8.9–10.3)
GFR calc Af Amer: >60 mL/min (ref >60)
GFR calc non Af Amer: >60 mL/min (ref >60)
GLUCOSE: 115 mg/dL — AB (ref 65–99)
POTASSIUM: 3.5 mmol/L (ref 3.5–5.1)
SODIUM: 138 mmol/L (ref 135–145)

## 2015-08-26 MED ORDER — ENSURE ENLIVE PO LIQD
237.0000 mL | Freq: Two times a day (BID) | ORAL | Status: DC
  Administered 2015-08-26 – 2015-08-28 (×4): 237 mL via ORAL

## 2015-08-26 MED ORDER — CIPROFLOXACIN HCL 500 MG PO TABS
500.0000 mg | ORAL_TABLET | Freq: Two times a day (BID) | ORAL | Status: DC
  Administered 2015-08-26 – 2015-08-28 (×5): 500 mg via ORAL
  Filled 2015-08-26 (×5): qty 1

## 2015-08-26 MED ORDER — METRONIDAZOLE 500 MG PO TABS
500.0000 mg | ORAL_TABLET | Freq: Three times a day (TID) | ORAL | Status: DC
  Administered 2015-08-26 – 2015-08-28 (×5): 500 mg via ORAL
  Filled 2015-08-26 (×5): qty 1

## 2015-08-26 NOTE — Progress Notes (Signed)
Referring Physician(s): Dr. Serita Grammes  Supervising Physician: Markus Daft  Chief Complaint: Abdominal abscess  Subjective: Now s/p perc drain via (L)TG approach. Sore as expected but feels better overall   Allergies: Codeine; Demerol; Dilaudid; Fentanyl; Morphine and related; and Nsaids  Medications:  Current facility-administered medications:  .  0.9 %  sodium chloride infusion, , Intravenous, Continuous, Fredia Sorrow, MD, Last Rate: 100 mL/hr at 08/24/15 2314 .  acetaminophen (TYLENOL) tablet 650 mg, 650 mg, Oral, Q6H PRN **OR** acetaminophen (TYLENOL) suppository 650 mg, 650 mg, Rectal, Q6H PRN, Samella Parr, NP .  atenolol (TENORMIN) tablet 50 mg, 50 mg, Oral, Daily, Samella Parr, NP, 50 mg at 08/26/15 [id number] .  ciprofloxacin (CIPRO) tablet 500 mg, 500 mg, Oral, BID, Annita Brod, MD .  doxazosin (CARDURA) tablet 2 mg, 2 mg, Oral, Daily, Samella Parr, NP, 2 mg at 08/26/15 [id number] .  feeding supplement (BOOST / RESOURCE BREEZE) liquid 1 Container, 1 Container, Oral, TID BM, Annita Brod, MD, 1 Container at 08/26/15 0845 .  HYDROmorphone (DILAUDID) injection 0.5-1 mg, 0.5-1 mg, Intravenous, Q2H PRN, Samella Parr, NP, 1 mg at 08/25/15 2158 .  LORazepam (ATIVAN) injection 0.25 mg, 0.25 mg, Intravenous, Q4H PRN, Gardiner Barefoot, NP, 0.25 mg at 08/24/15 2345 .  metroNIDAZOLE (FLAGYL) tablet 500 mg, 500 mg, Oral, 3 times per day, Annita Brod, MD .  ondansetron [facility]) injection 4 mg, 4 mg, Intravenous, 4 times per day, Samella Parr, NP, 4 mg at 08/26/15 0513 .  prochlorperazine (COMPAZINE) injection 5 mg, 5 mg, Intravenous, Q6H PRN, Samella Parr, NP, 5 mg at 08/24/15 2123    Vital Signs: BP 162/69 mmHg  Pulse 79  Temp(Src) 98.1 F (36.7 C) (Oral)  Resp 18  SpO2 95%  Physical Exam (L)TG drain intact, site clean, dry Purulent output in bulb   Imaging: Ct Abdomen Pelvis W Contrast  08/24/2015  CLINICAL DATA:  Left lower quadrant  abdominal pain EXAM: CT ABDOMEN AND PELVIS WITH CONTRAST TECHNIQUE: Multidetector CT imaging of the abdomen and pelvis was performed using the standard protocol following bolus administration of intravenous contrast. CONTRAST:  1 ISOVUE-300 IOPAMIDOL (ISOVUE-300) INJECTION 61% COMPARISON:  07/16/2015 FINDINGS: Lower chest and abdominal wall: Fatty Bochdalek's hernia on the right Hepatobiliary: Cystic density segment 2 lesion measuring 13 mm. Coarse parenchymal calcifications considered incidental.No evidence of biliary obstruction or stone. Pancreas: Ovoid low-density at the pancreatic neck measuring 21 mm in axial dimension, favor cystic pancreatic mass over focal dilatation the common bile duct given the normal diameter distally the pancreatic head. No malignant type enhancement or nodule. Spleen: Unremarkable. Adrenals/Urinary Tract: Negative adrenals. No hydronephrosis or stone. 1 cm upper pole cyst on the left. Right renal cortical scarring and thinning. Distorted urinary bladder via abscess. No pneumaturia or bladder wall thickening to suggest fistula. Reproductive:Hysterectomy. Stomach/Bowel: Inflammatory changes around the sigmoid colon have improved, but there is a new 60 x 33 x 35 mm thick walled gas and fluid collection interposed between the sigmoid colon and bladder. Given previous findings and extensive diverticulosis, this is likely diverticular abscess. Large stool volume on a chronic basis. No pericecal inflammation. Vascular/Lymphatic: No acute vascular abnormality. No mass or adenopathy. Peritoneal: No ascites or pneumoperitoneum. Musculoskeletal: No acute abnormalities. Advanced disc and facet degeneration. IMPRESSION: 1. 6 cm abscess between the sigmoid colon and bladder, presumably diverticular given recent CT findings. 2. 21 mm cystic pancreatic head mass which could be re-evaluated in 1 year by MRI or  CT. Additional chronic findings are described above Electronically Signed   By: Monte Fantasia M.D.   On: 08/24/2015 14:35   Ct Image Guided Drainage By Percutaneous Catheter  08/25/2015  INDICATION: 82 year old with diverticular pelvic abscess. EXAM: CT GUIDED DRAINAGE OF PELVIC ABSCESS MEDICATIONS: The patient is currently admitted to the hospital and receiving intravenous antibiotics. The antibiotics were administered within an appropriate time frame prior to the initiation of the procedure. ANESTHESIA/SEDATION: 2.0 mg IV Versed 100 mcg IV Fentanyl Moderate Sedation Time:  44 minutes The patient was continuously monitored during the procedure by the interventional radiology nurse under my direct supervision. COMPLICATIONS: None immediate. TECHNIQUE: Informed written consent was obtained from the patient after a thorough discussion of the procedural risks, benefits and alternatives. All questions were addressed. Maximal Sterile Barrier Technique was utilized including caps, mask, sterile gowns, sterile gloves, sterile drape, hand hygiene and skin antiseptic. A timeout was performed prior to the initiation of the procedure. PROCEDURE: Patient was initially placed in supine position and CT images through the pelvis were obtained. A safe percutaneous window was not identified for drainage of the abscess from an anterior approach. Subsequently, patient was placed prone and repeat images through the pelvis were obtained. A percutaneous window from the left transgluteal approach was selected. The left side of the back was prepped with chlorhexidine and a sterile field was created. Skin was anesthetized with 1% lidocaine. Using CT guidance, an 18 gauge trocar needle was directed into the air-fluid collection just above the vaginal cuff. Needle was advanced into the air-fluid collection. Fluid was unable to be aspirated from this needle. A wire was advanced to confirm placement within the abscess. The tract was dilated to accommodate a 10.2 [place] multipurpose drain. 20 mL of clay colored thick fluid was  removed. Follow up CT images were obtained. Catheter was sutured to the skin and a bandage was placed. Catheter was attached to a suction bulb. FINDINGS: Air-fluid collection anterior to the rectum and just above the vaginal cuff. Catheter was successfully placed within this collection from a left transgluteal approach. Decompression of the abscess after catheter placement. 20 mL of thick gray colored fluid was removed. Fluid was sent for culture. IMPRESSION: Successful CT-guided drain placement in the pelvic abscess collection. Electronically Signed   By: Markus Daft M.D.   On: 08/25/2015 12:43    Labs:  CBC:  Recent Labs  07/16/15 1620 08/24/15 1008 08/25/15 0546 08/26/15 0739  WBC 9.9 15.9* 12.3* 9.0  HGB 12.3 11.9* 11.1* 10.7*  HCT 36.1 36.8 32.9* 33.9*  PLT 344 323 290 320    COAGS:  Recent Labs  08/24/15 1704  INR 1.09    BMP:  Recent Labs  07/16/15 1620 08/24/15 1008 08/25/15 0546 08/26/15 0739  NA 131* 136 134* 138  K 3.9 4.6 3.7 3.5  CL 98* 103 102 102  CO2 27 24 24 26   GLUCOSE 101* 114* 112* 115*  BUN 17 16 14 9   CALCIUM 8.8* 9.5 8.4* 8.5*  CREATININE 0.71 0.70 0.80 0.65  GFRNONAA >60 >60 >60 >60  GFRAA >60 >60 >60 >60    LIVER FUNCTION TESTS:  Recent Labs  07/16/15 1620 08/24/15 1008 08/25/15 0546  BILITOT 0.3 0.4 0.6  AST 25 19 21   ALT 16 16 19   ALKPHOS 45 58 47  PROT 7.0 7.0 5.9*  ALBUMIN 3.9 3.3* 2.8*    Assessment and Plan: Pelvic abscess with diverticulitis S/p perc drain via (L)TG Monitor output, flush drain as  ordered.  Electronically Signed: Ascencion Dike 08/26/2015, 10:25 AM   I spent a total of 15 Minutes at the the patient's bedside AND on the patient's hospital floor or unit, greater than 50% of which was counseling/coordinating care for perc drain of pelvic diverticular abscess

## 2015-08-26 NOTE — Progress Notes (Signed)
PROGRESS NOTE  Elizabeth Mullins [id number] DOB: 09/08/1931 DOA: 08/24/2015 PCP: Idamae Schuller, MD  HPI/Recap of past 79 hours: 82 year old female with past medical history ofdiverticulitis and hypertension admitted on 4/6 with several days of nausea vomiting and left lower quadrant abdominal pain.In the emergency room, noted to have white count of 15.9 and CT of imaging notes cystic mass in pancreas as well assigns of diverticulitis with secondary abscess.  Patient admitted to hospitalist service on IV antibiotics.  Seen by general surgery and interventional radiology and patient underwent CT image guided drainage on morning of 4/7.    This morning, patient feeling better. White count now normalized. Apparently, she had been feeling quite anxious ever since she had heard that there was a mass in her pancreas. She was quite tearful with relief after being told that the mass is cystic in nature and does not appear to be malignant. So having some soreness and mild nausea  Assessment/Plan: Principal Problem:   Diverticulitis of colon with perforationwith secondary intra-abdominal abscess: Status post drain by interventional radiology. Now that white count normalized and abscess drained, advance diet to full liquids and change antibiotics to by mouth Active Problems:    HTN (hypertension): Blood pressure stable    ?? Pancreatic mass: Radiologist states appears cystic in nature and does not appear to be malignant in nature and recommends follow-up in one year with either CT or MRI   IBS (irritable bowel syndrome)   Inflammatory arthropathy ([place])   Chronic back pain   Code Status: Full code  Family Communication: Left message for daughter  Disposition Plan: Home in next 1-2 days, depending on pain, by mouth intake   Consultants:  Interventional radiology  General surgery   Procedures:  CT-guided drainage of pelvic diverticular abscess with placement of drain  Antibiotics:  Cipro  and Flagyl 4/6-present    Objective: BP 162/69 mmHg  Pulse 79  Temp(Src) 98.1 F (36.7 C) (Oral)  Resp 18  SpO2 95%  Intake/Output Summary (Last 24 hours) at 08/26/15 1112 Last data filed at 08/26/15 0600  Gross per 24 hour  Intake    940 ml  Output    610 ml  Net    330 ml    Exam:   General:  Alert and oriented 3,No acute distress  Cardiovascular: regular rate and rhythm, S1-S2   Respiratory: clear to auscultation bilaterally   Abdomen: soft, mild tenderness left lower quadrant, drain noted, minimal fluid  Musculoskeletal: o clubbing or cyanosis or edema    Data Reviewed: Basic Metabolic Panel:  Recent Labs Lab 08/24/15 1008 08/24/15 1820 08/25/15 0546 08/26/15 0739  NA 136  --  134* 138  K 4.6  --  3.7 3.5  CL 103  --  102 102  CO2 24  --  24 26  GLUCOSE 114*  --  112* 115*  BUN 16  --  14 9  CREATININE 0.70  --  0.80 0.65  CALCIUM 9.5  --  8.4* 8.5*  MG  --  2.1  --   --   PHOS  --  2.8  --   --    Liver Function Tests:  Recent Labs Lab 08/24/15 1008 08/25/15 0546  AST 19 21  ALT 16 19  ALKPHOS 58 47  BILITOT 0.4 0.6  PROT 7.0 5.9*  ALBUMIN 3.3* 2.8*    Recent Labs Lab 08/24/15 1008  LIPASE 19   No results for input(s): AMMONIA in the last 168 hours. CBC:  Recent  Labs Lab 08/24/15 1008 08/25/15 0546 08/26/15 0739  WBC 15.9* 12.3* 9.0  HGB 11.9* 11.1* 10.7*  HCT 36.8 32.9* 33.9*  MCV 91.5 92.4 92.4  PLT 323 290 320   Cardiac Enzymes:   No results for input(s): CKTOTAL, CKMB, CKMBINDEX, TROPONINI in the last 168 hours. BNP (last 3 results) No results for input(s): BNP in the last 8760 hours.  ProBNP (last 3 results) No results for input(s): PROBNP in the last 8760 hours.  CBG: No results for input(s): GLUCAP in the last 168 hours.  Recent Results (from the past 240 hour(s))  Urine culture     Status: None   Collection Time: 08/24/15 12:50 PM  Result Value Ref Range Status   Specimen Description URINE, RANDOM  Final    Special Requests NONE  Final   Culture MULTIPLE SPECIES PRESENT, SUGGEST RECOLLECTION  Final   Report Status 08/25/2015 FINAL  Final  Culture, blood (Routine X 2) w Reflex to ID Panel     Status: None (Preliminary result)   Collection Time: 08/24/15  5:00 PM  Result Value Ref Range Status   Specimen Description BLOOD LEFT ANTECUBITAL  Final   Special Requests BOTTLES DRAWN AEROBIC AND ANAEROBIC 10CC  Final   Culture NO GROWTH 2 DAYS  Final   Report Status PENDING  Incomplete  Culture, blood (Routine X 2) w Reflex to ID Panel     Status: None (Preliminary result)   Collection Time: 08/24/15  5:15 PM  Result Value Ref Range Status   Specimen Description BLOOD LEFT HAND  Final   Special Requests BOTTLES DRAWN AEROBIC ONLY 10CC  Final   Culture NO GROWTH 2 DAYS  Final   Report Status PENDING  Incomplete  Culture, routine-abscess     Status: None (Preliminary result)   Collection Time: 08/25/15 11:59 AM  Result Value Ref Range Status   Specimen Description ABSCESS PELVIS DRAINAGE  Final   Special Requests Normal  Final   Gram Stain   Final    ABUNDANT WBC PRESENT,BOTH PMN AND MONONUCLEAR NO SQUAMOUS EPITHELIAL CELLS SEEN ABUNDANT GRAM POSITIVE COCCI IN PAIRS Performed at [facility]    Culture PENDING  Incomplete   Report Status PENDING  Incomplete     Studies: No results found.  Scheduled Meds: . atenolol  50 mg Oral Daily  . ciprofloxacin  500 mg Oral BID  . doxazosin  2 mg Oral Daily  . feeding supplement  1 Container Oral TID BM  . metroNIDAZOLE  500 mg Oral 3 times per day  . ondansetron (ZOFRAN) IV  4 mg Intravenous 4 times per day    Continuous Infusions: . sodium chloride 100 mL/hr at 08/24/15 2314     Time spent: 15 minutes   [facility] Pager [phone] . If 7PM-7AM, please contact night-coverage at [email or web address], password [facility] 08/26/2015, 11:12 AM  LOS: 2 days

## 2015-08-26 NOTE — Progress Notes (Signed)
Subjective: Nauseated, less abdominal pain  Objective: Vital signs in last 24 hours: Temp:  [98.1 F (36.7 C)-98.7 F (37.1 C)] 98.1 F (36.7 C) (04/08 0544) Pulse Rate:  [74-92] 79 (04/08 0835) Resp:  [10-22] 18 (04/08 0544) BP: (97-162)/(45-74) 162/69 mmHg (04/08 0835) SpO2:  [95 %-100 %] 95 % (04/08 0544) Last BM Date: 08/26/15  Intake/Output from previous day: 04/07 0701 - 04/08 0700 In: 940 [P.O.:640; IV Piggyback:300] Out: 610 [Urine:600; Drains:10] Intake/Output this shift:    General appearance: cooperative Resp: clear to auscultation bilaterally GI: soft, mild LLQ tend, perc drain cloudy tan  Lab Results:   Recent Labs  08/24/15 1008 08/25/15 0546  WBC 15.9* 12.3*  HGB 11.9* 11.1*  HCT 36.8 32.9*  PLT 323 290   BMET  Recent Labs  08/24/15 1008 08/25/15 0546  NA 136 134*  K 4.6 3.7  CL 103 102  CO2 24 24  GLUCOSE 114* 112*  BUN 16 14  CREATININE 0.70 0.80  CALCIUM 9.5 8.4*   PT/INR  Recent Labs  08/24/15 1704  LABPROT 14.3  INR 1.09   ABG No results for input(s): PHART, HCO3 in the last 72 hours.  Invalid input(s): PCO2, PO2  Studies/Results: Ct Abdomen Pelvis W Contrast  08/24/2015  CLINICAL DATA:  Left lower quadrant abdominal pain EXAM: CT ABDOMEN AND PELVIS WITH CONTRAST TECHNIQUE: Multidetector CT imaging of the abdomen and pelvis was performed using the standard protocol following bolus administration of intravenous contrast. CONTRAST:  1 ISOVUE-300 IOPAMIDOL (ISOVUE-300) INJECTION 61% COMPARISON:  07/16/2015 FINDINGS: Lower chest and abdominal wall: Fatty Bochdalek's hernia on the right Hepatobiliary: Cystic density segment 2 lesion measuring 13 mm. Coarse parenchymal calcifications considered incidental.No evidence of biliary obstruction or stone. Pancreas: Ovoid low-density at the pancreatic neck measuring 21 mm in axial dimension, favor cystic pancreatic mass over focal dilatation the common bile duct given the normal diameter  distally the pancreatic head. No malignant type enhancement or nodule. Spleen: Unremarkable. Adrenals/Urinary Tract: Negative adrenals. No hydronephrosis or stone. 1 cm upper pole cyst on the left. Right renal cortical scarring and thinning. Distorted urinary bladder via abscess. No pneumaturia or bladder wall thickening to suggest fistula. Reproductive:Hysterectomy. Stomach/Bowel: Inflammatory changes around the sigmoid colon have improved, but there is a new 60 x 33 x 35 mm thick walled gas and fluid collection interposed between the sigmoid colon and bladder. Given previous findings and extensive diverticulosis, this is likely diverticular abscess. Large stool volume on a chronic basis. No pericecal inflammation. Vascular/Lymphatic: No acute vascular abnormality. No mass or adenopathy. Peritoneal: No ascites or pneumoperitoneum. Musculoskeletal: No acute abnormalities. Advanced disc and facet degeneration. IMPRESSION: 1. 6 cm abscess between the sigmoid colon and bladder, presumably diverticular given recent CT findings. 2. 21 mm cystic pancreatic head mass which could be re-evaluated in 1 year by MRI or CT. Additional chronic findings are described above Electronically Signed   By: Monte Fantasia M.D.   On: 08/24/2015 14:35   Ct Image Guided Drainage By Percutaneous Catheter  08/25/2015  INDICATION: 82 year old with diverticular pelvic abscess. EXAM: CT GUIDED DRAINAGE OF PELVIC ABSCESS MEDICATIONS: The patient is currently admitted to the hospital and receiving intravenous antibiotics. The antibiotics were administered within an appropriate time frame prior to the initiation of the procedure. ANESTHESIA/SEDATION: 2.0 mg IV Versed 100 mcg IV Fentanyl Moderate Sedation Time:  44 minutes The patient was continuously monitored during the procedure by the interventional radiology nurse under my direct supervision. COMPLICATIONS: None immediate. TECHNIQUE: Informed written consent was  obtained from the patient  after a thorough discussion of the procedural risks, benefits and alternatives. All questions were addressed. Maximal Sterile Barrier Technique was utilized including caps, mask, sterile gowns, sterile gloves, sterile drape, hand hygiene and skin antiseptic. A timeout was performed prior to the initiation of the procedure. PROCEDURE: Patient was initially placed in supine position and CT images through the pelvis were obtained. A safe percutaneous window was not identified for drainage of the abscess from an anterior approach. Subsequently, patient was placed prone and repeat images through the pelvis were obtained. A percutaneous window from the left transgluteal approach was selected. The left side of the back was prepped with chlorhexidine and a sterile field was created. Skin was anesthetized with 1% lidocaine. Using CT guidance, an 18 gauge trocar needle was directed into the air-fluid collection just above the vaginal cuff. Needle was advanced into the air-fluid collection. Fluid was unable to be aspirated from this needle. A wire was advanced to confirm placement within the abscess. The tract was dilated to accommodate a 10.2 [place] multipurpose drain. 20 mL of clay colored thick fluid was removed. Follow up CT images were obtained. Catheter was sutured to the skin and a bandage was placed. Catheter was attached to a suction bulb. FINDINGS: Air-fluid collection anterior to the rectum and just above the vaginal cuff. Catheter was successfully placed within this collection from a left transgluteal approach. Decompression of the abscess after catheter placement. 20 mL of thick gray colored fluid was removed. Fluid was sent for culture. IMPRESSION: Successful CT-guided drain placement in the pelvic abscess collection. Electronically Signed   By: Markus Daft M.D.   On: 08/25/2015 12:43    Anti-infectives: Anti-infectives    Start     Dose/Rate Route Frequency Ordered Stop   08/25/15 0330  ciprofloxacin  (CIPRO) IVPB 400 mg     400 mg 200 mL/hr over 60 Minutes Intravenous Every 12 hours 08/24/15 1751     08/25/15 0130  metroNIDAZOLE (FLAGYL) IVPB 500 mg     500 mg 100 mL/hr over 60 Minutes Intravenous Every 8 hours 08/24/15 1751     08/24/15 1515  ciprofloxacin (CIPRO) IVPB 400 mg     400 mg 200 mL/hr over 60 Minutes Intravenous  Once 08/24/15 1509 08/24/15 1657   08/24/15 1515  metroNIDAZOLE (FLAGYL) IVPB 500 mg     500 mg 100 mL/hr over 60 Minutes Intravenous  Once 08/24/15 1509 08/24/15 1834      Assessment/Plan: Diverticulitis with abscess - S/P perc drain, continue clears and IV ABX   LOS: 2 days    Erion Weightman E 08/26/2015

## 2015-08-26 NOTE — Progress Notes (Signed)
Initial Nutrition Assessment  DOCUMENTATION CODES:   Not applicable  INTERVENTION:  Ensure Enlive po BID, each supplement provides 350 kcal and 20 grams of protein Educated pt on good protein sources.   NUTRITION DIAGNOSIS:   Unintentional weight loss related to acute illness (stress) as evidenced by percent weight loss.  GOAL:   Patient will meet greater than or equal to 90% of their needs  MONITOR:   PO intake, Supplement acceptance, I & O's, Weight trends  REASON FOR ASSESSMENT:   Malnutrition Screening Tool    ASSESSMENT:   Diverticulitis of colon with perforation with secondary intra-abdominal abscess: Status post drain by interventional radiology.  Medications reviewed and include: zofran, flagyl Labs reviewed.  Per pt she has lost 7.5 lb in the last 6-8 weeks due to abscess which had decreased her appetite but she also thought that she had pancreatic cancer and was not eating due to stress as well.  Lives with daughter.  Pt willing to try ensure.   Diet Order:  Diet full liquid Room service appropriate?: Yes; Fluid consistency:: Thin  Skin:  Reviewed, no issues  Last BM:  4/8  Height:   Ht Readings from Last 1 Encounters:  07/16/15 4\' 11"  (1.499 m)    Weight:   Wt Readings from Last 1 Encounters:  07/16/15 150 lb (68.04 kg)    Ideal Body Weight:     BMI:  There is no weight on file to calculate BMI.  Estimated Nutritional Needs:   Kcal:  1600-1800  Protein:  75-85 grams  Fluid:  > 1.6 L/day  EDUCATION NEEDS:   Education needs addressed  Maylon Peppers RD, [place], [place] Pager [phone] After Hours Pager

## 2015-08-27 DIAGNOSIS — M549 Dorsalgia, unspecified: Secondary | ICD-10-CM

## 2015-08-27 DIAGNOSIS — G8929 Other chronic pain: Secondary | ICD-10-CM

## 2015-08-27 LAB — CBC
HCT: 34.4 % — ABNORMAL LOW (ref 36.0–46.0)
HEMOGLOBIN: 11.2 g/dL — AB (ref 12.0–15.0)
MCH: 29.6 pg (ref 26.0–34.0)
MCHC: 32.6 g/dL (ref 30.0–36.0)
MCV: 90.8 fL (ref 78.0–100.0)
Platelets: 345 10*3/uL (ref 150–400)
RBC: 3.79 MIL/uL — AB (ref 3.87–5.11)
RDW: 13.4 % (ref 11.5–15.5)
WBC: 8 10*3/uL (ref 4.0–10.5)

## 2015-08-27 MED ORDER — FUROSEMIDE 20 MG PO TABS
20.0000 mg | ORAL_TABLET | Freq: Once | ORAL | Status: AC
  Administered 2015-08-27: 20 mg via ORAL
  Filled 2015-08-27: qty 1

## 2015-08-27 NOTE — Progress Notes (Signed)
PROGRESS NOTE  Elizabeth Mullins [id number] DOB: 08-26-1931 DOA: 08/24/2015 PCP: Idamae Schuller, MD  HPI/Recap of past 21 hours: 82 year old female with past medical history ofdiverticulitis and hypertension admitted on 4/6 with several days of nausea vomiting and left lower quadrant abdominal pain.In the emergency room, noted to have white count of 15.9 and CT of imaging notes cystic mass in pancreas as well assigns of diverticulitis with secondary abscess.  Patient admitted to hospitalist service on IV antibiotics.  Seen by general surgery and interventional radiology and patient underwent CT image guided drainage on morning of 4/7.    Patient continues to improve. Tolerating full liquids with only some moderate soreness. Starting to ambulate more as well. No complaints.  Assessment/Plan: Principal Problem:   Diverticulitis of colon with perforationwith secondary intra-abdominal abscess: Status post drain by interventional radiology. Now that white count normalized and abscess drained, and responding well to full liquids, will advance diet to low residue solid in the morning and await follow-up from IR. Active Problems:    HTN (hypertension): Blood pressure stable    ?? Pancreatic mass: Radiologist states appears cystic in nature and does not appear to be malignant in nature and recommends follow-up in one year with either CT or MRI   IBS (irritable bowel syndrome)   Inflammatory arthropathy (HCC)   Chronic back pain   Code Status: Full code  Family Communication: Daughter at the bedside  Disposition Plan: Anticipate discharge tomorrow   Consultants:  Interventional radiology  General surgery   Procedures:  CT-guided drainage of pelvic diverticular abscess with placement of drain  Antibiotics:  Cipro and Flagyl 4/6-present    Objective: BP 122/67 mmHg  Pulse 84  Temp(Src) 98.3 F (36.8 C) (Oral)  Resp 19  SpO2 97%  Intake/Output Summary (Last 24 hours) at  08/27/15 1737 Last data filed at 08/27/15 1503  Gross per 24 hour  Intake    820 ml  Output   1160 ml  Net   -340 ml    Exam:   General:  Alert and oriented 3,In good spirits  Cardiovascular: regular rate and rhythm, S1-S2   Respiratory: clear to auscultation bilaterally   Abdomen: soft, minimal tenderness left lower quadrant, drain noted, minimal fluid  Musculoskeletal: No clubbing or cyanosis or edema    Data Reviewed: Basic Metabolic Panel:  Recent Labs Lab 08/24/15 1008 08/24/15 1820 08/25/15 0546 08/26/15 0739  NA 136  --  134* 138  K 4.6  --  3.7 3.5  CL 103  --  102 102  CO2 24  --  24 26  GLUCOSE 114*  --  112* 115*  BUN 16  --  14 9  CREATININE 0.70  --  0.80 0.65  CALCIUM 9.5  --  8.4* 8.5*  MG  --  2.1  --   --   PHOS  --  2.8  --   --    Liver Function Tests:  Recent Labs Lab 08/24/15 1008 08/25/15 0546  AST 19 21  ALT 16 19  ALKPHOS 58 47  BILITOT 0.4 0.6  PROT 7.0 5.9*  ALBUMIN 3.3* 2.8*    Recent Labs Lab 08/24/15 1008  LIPASE 19   No results for input(s): AMMONIA in the last 168 hours. CBC:  Recent Labs Lab 08/24/15 1008 08/25/15 0546 08/26/15 0739 08/27/15 0743  WBC 15.9* 12.3* 9.0 8.0  HGB 11.9* 11.1* 10.7* 11.2*  HCT 36.8 32.9* 33.9* 34.4*  MCV 91.5 92.4 92.4 90.8  PLT 323 290 320  345   Cardiac Enzymes:   No results for input(s): CKTOTAL, CKMB, CKMBINDEX, TROPONINI in the last 168 hours. BNP (last 3 results) No results for input(s): BNP in the last 8760 hours.  ProBNP (last 3 results) No results for input(s): PROBNP in the last 8760 hours.  CBG: No results for input(s): GLUCAP in the last 168 hours.  Recent Results (from the past 240 hour(s))  Urine culture     Status: None   Collection Time: 08/24/15 12:50 PM  Result Value Ref Range Status   Specimen Description URINE, RANDOM  Final   Special Requests NONE  Final   Culture MULTIPLE SPECIES PRESENT, SUGGEST RECOLLECTION  Final   Report Status 08/25/2015  FINAL  Final  Culture, blood (Routine X 2) w Reflex to ID Panel     Status: None (Preliminary result)   Collection Time: 08/24/15  5:00 PM  Result Value Ref Range Status   Specimen Description BLOOD LEFT ANTECUBITAL  Final   Special Requests BOTTLES DRAWN AEROBIC AND ANAEROBIC 10CC  Final   Culture NO GROWTH 3 DAYS  Final   Report Status PENDING  Incomplete  Culture, blood (Routine X 2) w Reflex to ID Panel     Status: None (Preliminary result)   Collection Time: 08/24/15  5:15 PM  Result Value Ref Range Status   Specimen Description BLOOD LEFT HAND  Final   Special Requests BOTTLES DRAWN AEROBIC ONLY 10CC  Final   Culture NO GROWTH 3 DAYS  Final   Report Status PENDING  Incomplete  Culture, routine-abscess     Status: None (Preliminary result)   Collection Time: 08/25/15 11:59 AM  Result Value Ref Range Status   Specimen Description ABSCESS PELVIS DRAINAGE  Final   Special Requests Normal  Final   Gram Stain   Final    ABUNDANT WBC PRESENT,BOTH PMN AND MONONUCLEAR NO SQUAMOUS EPITHELIAL CELLS SEEN ABUNDANT GRAM POSITIVE COCCI IN PAIRS Performed at [facility]    Culture   Final    Culture reincubated for better growth Performed at [facility]    Report Status PENDING  Incomplete     Studies: No results found.  Scheduled Meds: . atenolol  50 mg Oral Daily  . ciprofloxacin  500 mg Oral BID  . doxazosin  2 mg Oral Daily  . feeding supplement  1 Container Oral TID BM  . feeding supplement (ENSURE ENLIVE)  237 mL Oral BID BM  . metroNIDAZOLE  500 mg Oral 3 times per day  . ondansetron (ZOFRAN) IV  4 mg Intravenous 4 times per day    Continuous Infusions: . sodium chloride 100 mL/hr at 08/24/15 2314     Time spent: 15 minutes   [facility] Pager [phone] . If 7PM-7AM, please contact night-coverage at [email or web address], password [facility] 08/27/2015, 5:37 PM  LOS: 3 days

## 2015-08-27 NOTE — Discharge Instructions (Signed)
Low-Fiber Diet °Fiber is found in fruits, vegetables, and whole grains. A low-fiber diet restricts fibrous foods that are not digested in the small intestine. A diet containing about 10-15 grams of fiber per day is considered low fiber. Low-fiber diets may be used to: °· Promote healing and rest the bowel during intestinal flare-ups. °· Prevent blockage of a partially obstructed or narrowed gastrointestinal tract. °· Reduce fecal weight and volume. °· Slow the movement of feces. °You may be on a low-fiber diet as a transitional diet following surgery, after an injury (trauma), or because of a short (acute) or lifelong (chronic) illness. Your health care provider will determine the length of time you need to stay on this diet.  °WHAT DO I NEED TO KNOW ABOUT A LOW-FIBER DIET? °Always check the fiber content on the packaging's Nutrition Facts label, especially on foods from the grains list. Ask your dietitian if you have questions about specific foods that are related to your condition, especially if the food is not listed below. In general, a low-fiber food will have less than 2 g of fiber. °WHAT FOODS CAN I EAT? °Grains °All breads and crackers made with white flour. Sweet rolls, doughnuts, waffles, pancakes, French toast, bagels. Pretzels, Melba toast, zwieback. Well-cooked cereals, such as cornmeal, farina, or cream cereals. Dry cereals that do not contain whole grains, fruit, or nuts, such as refined corn, wheat, rice, and oat cereals. Potatoes prepared any way without skins, plain pastas and noodles, refined white rice. Use white flour for baking and making sauces. Use allowed list of grains for casseroles, dumplings, and puddings.  °Vegetables °Strained tomato and vegetable juices. Fresh lettuce, cucumber, spinach. Well-cooked (no skin or pulp) or canned vegetables, such as asparagus, bean sprouts, beets, carrots, green beans, mushrooms, potatoes, pumpkin, spinach, yellow squash, tomato sauce/puree, turnips,  yams, and zucchini. Keep servings limited to ½ cup.  °Fruits °All fruit juices except prune juice. Cooked or canned fruits without skin and seeds, such as applesauce, apricots, cherries, fruit cocktail, grapefruit, grapes, mandarin oranges, melons, peaches, pears, pineapple, and plums. Fresh fruits without skin, such as apricots, avocados, bananas, melons, pineapple, nectarines, and peaches. Keep servings limited to ½ cup or 1 piece.   °Meat and Other Protein Sources °Ground or well-cooked tender beef, ham, veal, lamb, pork, or poultry. Eggs, plain cheese. Fish, oysters, shrimp, lobster, and other seafood. Liver, organ meats. Smooth nut butters. °Dairy °All milk products and alternative dairy substitutes, such as soy, rice, almond, and coconut, not containing added whole nuts, seeds, or added fruit. °Beverages °Decaf coffee, fruit, and vegetable juices or smoothies (small amounts, with no pulp or skins, and with fruits from allowed list), sports drinks, herbal tea. °Condiments °Ketchup, mustard, vinegar, cream sauce, cheese sauce, cocoa powder. Spices in moderation, such as allspice, basil, bay leaves, celery powder or leaves, cinnamon, cumin powder, curry powder, ginger, mace, marjoram, onion or garlic powder, oregano, paprika, parsley flakes, ground pepper, rosemary, sage, savory, tarragon, thyme, and turmeric. °Sweets and Desserts °Plain cakes and cookies, pie made with allowed fruit, pudding, custard, cream pie. Gelatin, fruit, ice, sherbet, frozen ice pops. Ice cream, ice milk without nuts. Plain hard candy, honey, jelly, molasses, syrup, sugar, chocolate syrup, gumdrops, marshmallows. Limit overall sugar intake.  °Fats and Oil °Margarine, butter, cream, mayonnaise, salad oils, plain salad dressings made from allowed foods. Choose healthy fats such as olive oil, canola oil, and omega-3 fatty acids (such as found in salmon or tuna) when possible.  °Other °Bouillon, broth, or cream soups made   from allowed foods.  Any strained soup. Casseroles or mixed dishes made with allowed foods. °The items listed above may not be a complete list of recommended foods or beverages. Contact your dietitian for more options.  °WHAT FOODS ARE NOT RECOMMENDED? °Grains °All whole wheat and whole grain breads and crackers. Multigrains, rye, bran seeds, nuts, or coconut. Cereals containing whole grains, multigrains, bran, coconut, nuts, raisins. Cooked or dry oatmeal, steel-cut oats. Coarse wheat cereals, granola. Cereals advertised as high fiber. Potato skins. Whole grain pasta, wild or brown rice. Popcorn. Coconut flour. Bran, buckwheat, corn bread, multigrains, rye, wheat germ.  °Vegetables °Fresh, cooked or canned vegetables, such as artichokes, asparagus, beet greens, broccoli, Brussels sprouts, cabbage, celery, cauliflower, corn, eggplant, kale, legumes or beans, okra, peas, and tomatoes. Avoid large servings of any vegetables, especially raw vegetables.  °Fruits °Fresh fruits, such as apples with or without skin, berries, cherries, figs, grapes, grapefruit, guavas, kiwis, mangoes, oranges, papayas, pears, persimmons, pineapple, and pomegranate. Prune juice and juices with pulp, stewed or dried prunes. Dried fruits, dates, raisins. Fruit seeds or skins. Avoid large servings of all fresh fruits. °Meats and Other Protein Sources °Tough, fibrous meats with gristle. Chunky nut butter. Cheese made with seeds, nuts, or other foods not recommended. Nuts, seeds, legumes (beans, including baked beans), dried peas, beans, lentils.  °Dairy °Yogurt or cheese that contains nuts, seeds, or added fruit.  °Beverages °Fruit juices with high pulp, prune juice. Caffeinated coffee and teas.  °Condiments °Coconut, maple syrup, pickles, olives. °Sweets and Desserts °Desserts, cookies, or candies that contain nuts or coconut, chunky peanut butter, dried fruits. Jams, preserves with seeds, marmalade. Large amounts of sugar and sweets. Any other dessert made with  fruits from the not recommended list.  °Other °Soups made from vegetables that are not recommended or that contain other foods not recommended.  °The items listed above may not be a complete list of foods and beverages to avoid. Contact your dietitian for more information. °  °This information is not intended to replace advice given to you by your health care provider. Make sure you discuss any questions you have with your health care provider. °  °Document Released: 10/26/2001 Document Revised: 05/11/2013 Document Reviewed: 03/29/2013 °[employer] Interactive Patient Education ©2016 [employer] ° °

## 2015-08-27 NOTE — Progress Notes (Signed)
Subjective: intermittent lower abdominal pain  Objective: Vital signs in last 24 hours: Temp:  [98.2 F (36.8 C)-98.7 F (37.1 C)] 98.2 F (36.8 C) (04/09 0419) Pulse Rate:  [73-84] 84 (04/09 0858) Resp:  [18-19] 18 (04/09 0419) BP: (135-181)/(63-85) 181/75 mmHg (04/09 0858) SpO2:  [94 %-98 %] 98 % (04/09 0419) Last BM Date: 08/26/15  Intake/Output from previous day: 04/08 0701 - 04/09 0700 In: 120 [P.O.:120] Out: 1160 [Urine:1150; Drains:10] Intake/Output this shift: Total I/O In: 400 [P.O.:400] Out: -   General appearance: cooperative Resp: clear to auscultation bilaterally GI: soft, tender RLQ without guarding, JP purulent  Lab Results:   Recent Labs  08/26/15 0739 08/27/15 0743  WBC 9.0 8.0  HGB 10.7* 11.2*  HCT 33.9* 34.4*  PLT 320 345   BMET  Recent Labs  08/25/15 0546 08/26/15 0739  NA 134* 138  K 3.7 3.5  CL 102 102  CO2 24 26  GLUCOSE 112* 115*  BUN 14 9  CREATININE 0.80 0.65  CALCIUM 8.4* 8.5*   PT/INR  Recent Labs  08/24/15 1704  LABPROT 14.3  INR 1.09   ABG No results for input(s): PHART, HCO3 in the last 72 hours.  Invalid input(s): PCO2, PO2  Studies/Results: Ct Image Guided Drainage By Percutaneous Catheter  08/25/2015  INDICATION: 82 year old with diverticular pelvic abscess. EXAM: CT GUIDED DRAINAGE OF PELVIC ABSCESS MEDICATIONS: The patient is currently admitted to the hospital and receiving intravenous antibiotics. The antibiotics were administered within an appropriate time frame prior to the initiation of the procedure. ANESTHESIA/SEDATION: 2.0 mg IV Versed 100 mcg IV Fentanyl Moderate Sedation Time:  44 minutes The patient was continuously monitored during the procedure by the interventional radiology nurse under my direct supervision. COMPLICATIONS: None immediate. TECHNIQUE: Informed written consent was obtained from the patient after a thorough discussion of the procedural risks, benefits and alternatives. All questions  were addressed. Maximal Sterile Barrier Technique was utilized including caps, mask, sterile gowns, sterile gloves, sterile drape, hand hygiene and skin antiseptic. A timeout was performed prior to the initiation of the procedure. PROCEDURE: Patient was initially placed in supine position and CT images through the pelvis were obtained. A safe percutaneous window was not identified for drainage of the abscess from an anterior approach. Subsequently, patient was placed prone and repeat images through the pelvis were obtained. A percutaneous window from the left transgluteal approach was selected. The left side of the back was prepped with chlorhexidine and a sterile field was created. Skin was anesthetized with 1% lidocaine. Using CT guidance, an 18 gauge trocar needle was directed into the air-fluid collection just above the vaginal cuff. Needle was advanced into the air-fluid collection. Fluid was unable to be aspirated from this needle. A wire was advanced to confirm placement within the abscess. The tract was dilated to accommodate a 10.2 [place] multipurpose drain. 20 mL of clay colored thick fluid was removed. Follow up CT images were obtained. Catheter was sutured to the skin and a bandage was placed. Catheter was attached to a suction bulb. FINDINGS: Air-fluid collection anterior to the rectum and just above the vaginal cuff. Catheter was successfully placed within this collection from a left transgluteal approach. Decompression of the abscess after catheter placement. 20 mL of thick gray colored fluid was removed. Fluid was sent for culture. IMPRESSION: Successful CT-guided drain placement in the pelvic abscess collection. Electronically Signed   By: Markus Daft M.D.   On: 08/25/2015 12:43    Anti-infectives: Anti-infectives    Start  Dose/Rate Route Frequency Ordered Stop   08/26/15 1030  ciprofloxacin (CIPRO) tablet 500 mg     500 mg Oral 2 times daily 08/26/15 1016     08/26/15 1025  metroNIDAZOLE  (FLAGYL) tablet 500 mg     500 mg Oral 3 times per day 08/26/15 1016     08/25/15 0330  ciprofloxacin (CIPRO) IVPB 400 mg  Status:  Discontinued     400 mg 200 mL/hr over 60 Minutes Intravenous Every 12 hours 08/24/15 1751 08/26/15 1016   08/25/15 0130  metroNIDAZOLE (FLAGYL) IVPB 500 mg  Status:  Discontinued     500 mg 100 mL/hr over 60 Minutes Intravenous Every 8 hours 08/24/15 1751 08/26/15 1016   08/24/15 1515  ciprofloxacin (CIPRO) IVPB 400 mg     400 mg 200 mL/hr over 60 Minutes Intravenous  Once 08/24/15 1509 08/24/15 1657   08/24/15 1515  metroNIDAZOLE (FLAGYL) IVPB 500 mg     500 mg 100 mL/hr over 60 Minutes Intravenous  Once 08/24/15 1509 08/24/15 1834      Assessment/Plan:    Subjective: Nauseated, less abdominal pain  Objective: Vital signs in last 24 hours: Temp:  [98.2 F (36.8 C)-98.7 F (37.1 C)] 98.2 F (36.8 C) (04/09 0419) Pulse Rate:  [73-84] 84 (04/09 0858) Resp:  [18-19] 18 (04/09 0419) BP: (135-181)/(63-85) 181/75 mmHg (04/09 0858) SpO2:  [94 %-98 %] 98 % (04/09 0419) Last BM Date: 08/26/15  Intake/Output from previous day: 04/08 0701 - 04/09 0700 In: 120 [P.O.:120] Out: 1160 [Urine:1150; Drains:10] Intake/Output this shift: Total I/O In: 400 [P.O.:400] Out: -   General appearance: cooperative Resp: clear to auscultation bilaterally GI: soft, mild LLQ tend, perc drain cloudy tan  Lab Results:   Recent Labs  08/26/15 0739 08/27/15 0743  WBC 9.0 8.0  HGB 10.7* 11.2*  HCT 33.9* 34.4*  PLT 320 345   BMET  Recent Labs  08/25/15 0546 08/26/15 0739  NA 134* 138  K 3.7 3.5  CL 102 102  CO2 24 26  GLUCOSE 112* 115*  BUN 14 9  CREATININE 0.80 0.65  CALCIUM 8.4* 8.5*   PT/INR  Recent Labs  08/24/15 1704  LABPROT 14.3  INR 1.09   ABG No results for input(s): PHART, HCO3 in the last 72 hours.  Invalid input(s): PCO2, PO2  Studies/Results: Ct Image Guided Drainage By Percutaneous Catheter  08/25/2015  INDICATION:  82 year old with diverticular pelvic abscess. EXAM: CT GUIDED DRAINAGE OF PELVIC ABSCESS MEDICATIONS: The patient is currently admitted to the hospital and receiving intravenous antibiotics. The antibiotics were administered within an appropriate time frame prior to the initiation of the procedure. ANESTHESIA/SEDATION: 2.0 mg IV Versed 100 mcg IV Fentanyl Moderate Sedation Time:  44 minutes The patient was continuously monitored during the procedure by the interventional radiology nurse under my direct supervision. COMPLICATIONS: None immediate. TECHNIQUE: Informed written consent was obtained from the patient after a thorough discussion of the procedural risks, benefits and alternatives. All questions were addressed. Maximal Sterile Barrier Technique was utilized including caps, mask, sterile gowns, sterile gloves, sterile drape, hand hygiene and skin antiseptic. A timeout was performed prior to the initiation of the procedure. PROCEDURE: Patient was initially placed in supine position and CT images through the pelvis were obtained. A safe percutaneous window was not identified for drainage of the abscess from an anterior approach. Subsequently, patient was placed prone and repeat images through the pelvis were obtained. A percutaneous window from the left transgluteal approach was selected. The left side  of the back was prepped with chlorhexidine and a sterile field was created. Skin was anesthetized with 1% lidocaine. Using CT guidance, an 18 gauge trocar needle was directed into the air-fluid collection just above the vaginal cuff. Needle was advanced into the air-fluid collection. Fluid was unable to be aspirated from this needle. A wire was advanced to confirm placement within the abscess. The tract was dilated to accommodate a 10.2 [place] multipurpose drain. 20 mL of clay colored thick fluid was removed. Follow up CT images were obtained. Catheter was sutured to the skin and a bandage was placed. Catheter was  attached to a suction bulb. FINDINGS: Air-fluid collection anterior to the rectum and just above the vaginal cuff. Catheter was successfully placed within this collection from a left transgluteal approach. Decompression of the abscess after catheter placement. 20 mL of thick gray colored fluid was removed. Fluid was sent for culture. IMPRESSION: Successful CT-guided drain placement in the pelvic abscess collection. Electronically Signed   By: Markus Daft M.D.   On: 08/25/2015 12:43    Anti-infectives: Anti-infectives    Start     Dose/Rate Route Frequency Ordered Stop   08/26/15 1030  ciprofloxacin (CIPRO) tablet 500 mg     500 mg Oral 2 times daily 08/26/15 1016     08/26/15 1025  metroNIDAZOLE (FLAGYL) tablet 500 mg     500 mg Oral 3 times per day 08/26/15 1016     08/25/15 0330  ciprofloxacin (CIPRO) IVPB 400 mg  Status:  Discontinued     400 mg 200 mL/hr over 60 Minutes Intravenous Every 12 hours 08/24/15 1751 08/26/15 1016   08/25/15 0130  metroNIDAZOLE (FLAGYL) IVPB 500 mg  Status:  Discontinued     500 mg 100 mL/hr over 60 Minutes Intravenous Every 8 hours 08/24/15 1751 08/26/15 1016   08/24/15 1515  ciprofloxacin (CIPRO) IVPB 400 mg     400 mg 200 mL/hr over 60 Minutes Intravenous  Once 08/24/15 1509 08/24/15 1657   08/24/15 1515  metroNIDAZOLE (FLAGYL) IVPB 500 mg     500 mg 100 mL/hr over 60 Minutes Intravenous  Once 08/24/15 1509 08/24/15 1834      Assessment/Plan: Diverticulitis with abscess - S/P perc drain, continue clears and IV ABX   LOS: 3 days    Shadd Dunstan E 08/27/2015 Diverticulitis with abscess - S/P perc drain, changed to PO ABX yesterday, would continue full liquids today and plan low residue diet tomorrow. IR following drain.   LOS: 3 days    Kalisa Girtman E 08/27/2015

## 2015-08-28 ENCOUNTER — Other Ambulatory Visit: Payer: Self-pay | Admitting: General Surgery

## 2015-08-28 ENCOUNTER — Other Ambulatory Visit: Payer: Self-pay | Admitting: Internal Medicine

## 2015-08-28 DIAGNOSIS — K5720 Diverticulitis of large intestine with perforation and abscess without bleeding: Secondary | ICD-10-CM

## 2015-08-28 DIAGNOSIS — K651 Peritoneal abscess: Secondary | ICD-10-CM

## 2015-08-28 LAB — CULTURE, ROUTINE-ABSCESS: SPECIAL REQUESTS: NORMAL

## 2015-08-28 MED ORDER — METRONIDAZOLE 500 MG PO TABS: 500.0000 mg | ORAL_TABLET | Freq: Three times a day (TID) | ORAL | Status: DC

## 2015-08-28 MED ORDER — ONDANSETRON HCL 4 MG PO TABS
4.0000 mg | ORAL_TABLET | Freq: Once | ORAL | Status: AC
  Administered 2015-08-28: 4 mg via ORAL
  Filled 2015-08-28: qty 1

## 2015-08-28 MED ORDER — CIPROFLOXACIN HCL 500 MG PO TABS: 500.0000 mg | ORAL_TABLET | Freq: Two times a day (BID) | ORAL | Status: DC

## 2015-08-28 MED ORDER — OXYCODONE HCL 5 MG PO TABS: 5.0000 mg | ORAL_TABLET | ORAL | Status: DC | PRN

## 2015-08-28 MED ORDER — AMOXICILLIN-POT CLAVULANATE 875-125 MG PO TABS
1.0000 | ORAL_TABLET | Freq: Two times a day (BID) | ORAL | Status: DC
  Administered 2015-08-28: 1 via ORAL
  Filled 2015-08-28: qty 1

## 2015-08-28 NOTE — Progress Notes (Signed)
Pharmacist Provided - Patient Medication Education Prior to Discharge   Ginnifer Oncale is an 82 y.o. female who presented to [facility] on 08/24/2015 with a chief complaint of  Chief Complaint  Patient presents with  . Abdominal Pain     [x]  Patient will be discharged with 3 new medications []  Patient being discharged without any new medications  The following medications were discussed with the patient: metronidazole, ciprofloxacin, oxycodone  Pain Control medications: [x]  Yes    []  No  Diabetes Medications: []  Yes    [x]  No  Heart Failure Medications: []  Yes    [x]  No  Anticoagulation Medications:  []  Yes    [x]  No  Antibiotics at discharge: [x]  Yes    []  No  Allergy Assessment Completed and Updated: [x]  Yes    []  No Identified Patient Allergies:  Allergies  Allergen Reactions  . Codeine Nausea And Vomiting  . Demerol [Meperidine] Nausea And Vomiting  . Dilaudid [Hydromorphone Hcl]     Hallucination   . Fentanyl     Hallucinations   . Morphine And Related     Nausea and vomiting   . Nsaids      Medication Adherence Assessment: [x]  Excellent (no doses missed/week)      []  Good (1 dose missed/week)      []  Partial (2-3 doses missed/week)      []  Poor (>3 doses missed/week)  Barriers to Obtaining Medications: []  Yes [x]  No  Assessment: Mrs. Ghali has been on Cipro/Flagyl several times throughout her life so she is very familiar with how to take the medications properly. I reviewed common side effects with her including nausea and sun sensitivity. We also reviewed drug/food interactions with Cipro and Flagyl (alcohol, calcium products, dairy). She was very appreciative of the education.  Time spent preparing for discharge counseling: 10 min Time spent counseling patient: 15 min  Governor Specking, PharmD Clinical Pharmacy Resident Pager: [phone] 08/28/2015, 3:30 PM

## 2015-08-28 NOTE — Progress Notes (Signed)
Referring Physician(s): Dr Maryland Pink  Supervising Physician: Daryll Brod  Chief Complaint:  Diverticular abscess Drain placed 4/7  Subjective:  Output 25 cc yesterday Flushing 5 cc 3x/day +Cx microaerophilic strept  Pt better daily ambulating  Allergies: Codeine; Demerol; Dilaudid; Fentanyl; Morphine and related; and Nsaids  Medications: Prior to Admission medications   Medication Sig Start Date End Date Taking? Authorizing Provider  atenolol (TENORMIN) 50 MG tablet Take 50 mg by mouth daily.   Yes Historical Provider, MD  carisoprodol (SOMA) 350 MG tablet Take 350 mg by mouth 4 (four) times daily as needed for muscle spasms.   Yes Historical Provider, MD  doxazosin (CARDURA) 2 MG tablet Take 2 mg by mouth daily.   Yes Historical Provider, MD  gabapentin (NEURONTIN) 800 MG tablet Take 800 mg by mouth 3 (three) times daily.   Yes Historical Provider, MD  hydrochlorothiazide (HYDRODIURIL) 25 MG tablet Take 25 mg by mouth daily.   Yes Historical Provider, MD  hyoscyamine (ANASPAZ) 0.125 MG TBDP disintergrating tablet Place 0.125 mg under the tongue.   Yes Historical Provider, MD  raloxifene (EVISTA) 60 MG tablet Take 60 mg by mouth daily.   Yes Historical Provider, MD  ciprofloxacin (CIPRO) 500 MG tablet Take 1 tablet (500 mg total) by mouth 2 (two) times daily. 07/16/15   Quintella Reichert, MD  metroNIDAZOLE (FLAGYL) 500 MG tablet Take 1 tablet (500 mg total) by mouth 3 (three) times daily. 07/16/15   Quintella Reichert, MD  ondansetron (ZOFRAN) 4 MG tablet Take 1 tablet (4 mg total) by mouth every 6 (six) hours. 07/16/15   Quintella Reichert, MD     Vital Signs: BP 129/71 mmHg  Pulse 97  Temp(Src) 98.9 F (37.2 C) (Oral)  Resp 19  SpO2 93%  Physical Exam  Constitutional: She is oriented to person, place, and time.  Musculoskeletal: Normal range of motion.  Neurological: She is alert and oriented to person, place, and time.  Skin: Skin is warm and dry.  TG diverticular  abscess drain intact Output cloudy yellow 25 cc yesterday Scant in JP now Cx +strept afeb Wbc 8 Site clean and dry   Nursing note and vitals reviewed.   Imaging: Ct Abdomen Pelvis W Contrast  08/24/2015  CLINICAL DATA:  Left lower quadrant abdominal pain EXAM: CT ABDOMEN AND PELVIS WITH CONTRAST TECHNIQUE: Multidetector CT imaging of the abdomen and pelvis was performed using the standard protocol following bolus administration of intravenous contrast. CONTRAST:  1 ISOVUE-300 IOPAMIDOL (ISOVUE-300) INJECTION 61% COMPARISON:  07/16/2015 FINDINGS: Lower chest and abdominal wall: Fatty Bochdalek's hernia on the right Hepatobiliary: Cystic density segment 2 lesion measuring 13 mm. Coarse parenchymal calcifications considered incidental.No evidence of biliary obstruction or stone. Pancreas: Ovoid low-density at the pancreatic neck measuring 21 mm in axial dimension, favor cystic pancreatic mass over focal dilatation the common bile duct given the normal diameter distally the pancreatic head. No malignant type enhancement or nodule. Spleen: Unremarkable. Adrenals/Urinary Tract: Negative adrenals. No hydronephrosis or stone. 1 cm upper pole cyst on the left. Right renal cortical scarring and thinning. Distorted urinary bladder via abscess. No pneumaturia or bladder wall thickening to suggest fistula. Reproductive:Hysterectomy. Stomach/Bowel: Inflammatory changes around the sigmoid colon have improved, but there is a new 60 x 33 x 35 mm thick walled gas and fluid collection interposed between the sigmoid colon and bladder. Given previous findings and extensive diverticulosis, this is likely diverticular abscess. Large stool volume on a chronic basis. No pericecal inflammation. Vascular/Lymphatic: No acute vascular abnormality.  No mass or adenopathy. Peritoneal: No ascites or pneumoperitoneum. Musculoskeletal: No acute abnormalities. Advanced disc and facet degeneration. IMPRESSION: 1. 6 cm abscess between the  sigmoid colon and bladder, presumably diverticular given recent CT findings. 2. 21 mm cystic pancreatic head mass which could be re-evaluated in 1 year by MRI or CT. Additional chronic findings are described above Electronically Signed   By: Monte Fantasia M.D.   On: 08/24/2015 14:35   Ct Image Guided Drainage By Percutaneous Catheter  08/25/2015  INDICATION: 82 year old with diverticular pelvic abscess. EXAM: CT GUIDED DRAINAGE OF PELVIC ABSCESS MEDICATIONS: The patient is currently admitted to the hospital and receiving intravenous antibiotics. The antibiotics were administered within an appropriate time frame prior to the initiation of the procedure. ANESTHESIA/SEDATION: 2.0 mg IV Versed 100 mcg IV Fentanyl Moderate Sedation Time:  44 minutes The patient was continuously monitored during the procedure by the interventional radiology nurse under my direct supervision. COMPLICATIONS: None immediate. TECHNIQUE: Informed written consent was obtained from the patient after a thorough discussion of the procedural risks, benefits and alternatives. All questions were addressed. Maximal Sterile Barrier Technique was utilized including caps, mask, sterile gowns, sterile gloves, sterile drape, hand hygiene and skin antiseptic. A timeout was performed prior to the initiation of the procedure. PROCEDURE: Patient was initially placed in supine position and CT images through the pelvis were obtained. A safe percutaneous window was not identified for drainage of the abscess from an anterior approach. Subsequently, patient was placed prone and repeat images through the pelvis were obtained. A percutaneous window from the left transgluteal approach was selected. The left side of the back was prepped with chlorhexidine and a sterile field was created. Skin was anesthetized with 1% lidocaine. Using CT guidance, an 18 gauge trocar needle was directed into the air-fluid collection just above the vaginal cuff. Needle was advanced  into the air-fluid collection. Fluid was unable to be aspirated from this needle. A wire was advanced to confirm placement within the abscess. The tract was dilated to accommodate a 10.2 [place] multipurpose drain. 20 mL of clay colored thick fluid was removed. Follow up CT images were obtained. Catheter was sutured to the skin and a bandage was placed. Catheter was attached to a suction bulb. FINDINGS: Air-fluid collection anterior to the rectum and just above the vaginal cuff. Catheter was successfully placed within this collection from a left transgluteal approach. Decompression of the abscess after catheter placement. 20 mL of thick gray colored fluid was removed. Fluid was sent for culture. IMPRESSION: Successful CT-guided drain placement in the pelvic abscess collection. Electronically Signed   By: Markus Daft M.D.   On: 08/25/2015 12:43    Labs:  CBC:  Recent Labs  08/24/15 1008 08/25/15 0546 08/26/15 0739 08/27/15 0743  WBC 15.9* 12.3* 9.0 8.0  HGB 11.9* 11.1* 10.7* 11.2*  HCT 36.8 32.9* 33.9* 34.4*  PLT 323 290 320 345    COAGS:  Recent Labs  08/24/15 1704  INR 1.09    BMP:  Recent Labs  07/16/15 1620 08/24/15 1008 08/25/15 0546 08/26/15 0739  NA 131* 136 134* 138  K 3.9 4.6 3.7 3.5  CL 98* 103 102 102  CO2 27 24 24 26   GLUCOSE 101* 114* 112* 115*  BUN 17 16 14 9   CALCIUM 8.8* 9.5 8.4* 8.5*  CREATININE 0.71 0.70 0.80 0.65  GFRNONAA >60 >60 >60 >60  GFRAA >60 >60 >60 >60    LIVER FUNCTION TESTS:  Recent Labs  07/16/15 1620 08/24/15 1008  08/25/15 0546  BILITOT 0.3 0.4 0.6  AST 25 19 21   ALT 16 16 19   ALKPHOS 45 58 47  PROT 7.0 7.0 5.9*  ALBUMIN 3.9 3.3* 2.8*    Assessment and Plan:  Diverticular abscess Drain placed 4/7 Output 25 cc yesterday Would recommend drain injection in IR before drain removal (can be performed as IP or OP) Can also be set up in IR OP drain clinic if needed Call [phone] if need help  Electronically  Signed: Dema Timmons A 08/28/2015, 9:26 AM   I spent a total of 15 Minutes at the the patient's bedside AND on the patient's hospital floor or unit, greater than 50% of which was counseling/coordinating care for abscess drain

## 2015-08-28 NOTE — Progress Notes (Signed)
Patient ID: Elizabeth Mullins, female   DOB: 07/02/1931, 82 y.o.   MRN: [id number]     [address] [place], [place] [id number]    Phone: [phone] FAX: [phone]     Subjective: No n/v. Afebrile. VSS.  98ml drain output.   Objective:  Vital signs:  Filed Vitals:   08/27/15 1351 08/27/15 2057 08/28/15 0414 08/28/15 0920  BP: 122/67 146/94 132/60 129/71  Pulse: 84 82 82 97  Temp: 98.3 F (36.8 C) 98.7 F (37.1 C) 98.9 F (37.2 C)   TempSrc: Oral  Oral   Resp: 19     SpO2: 97% 97% 93%     Last BM Date: 08/26/15  Intake/Output   Yesterday:  04/09 0701 - 04/10 0700 In: 700 [P.O.:700] Out: 1725 [Urine:1700; Drains:25] This shift: I/O last 3 completed shifts: In: 700 [P.O.:700] Out: 2585 [Urine:2550; Drains:35]    Physical Exam: General: Pt awake/alert/oriented x4 in no acute distress Abdomen: Soft.  Nondistended.  Mild ttp rlq.  Drain with purulent output.  No evidence of peritonitis.  No incarcerated hernias.    Problem List:   Principal Problem:   Diverticulitis of colon with perforation Active Problems:   Intra-abdominal abscess ([place])   HTN (hypertension)   Abnormal urinalysis   ?? Pancreatic mass   IBS (irritable bowel syndrome)   Inflammatory arthropathy ([facility])   Chronic back pain    Results:   Labs: Results for orders placed or performed during the hospital encounter of 08/24/15 (from the past 48 hour(s))  CBC     Status: Abnormal   Collection Time: 08/27/15  7:43 AM  Result Value Ref Range   WBC 8.0 4.0 - 10.5 K/uL   RBC 3.79 (L) 3.87 - 5.11 MIL/uL   Hemoglobin 11.2 (L) 12.0 - 15.0 g/dL   HCT 34.4 (L) 36.0 - 46.0 %   MCV 90.8 78.0 - 100.0 fL   MCH 29.6 26.0 - 34.0 pg   MCHC 32.6 30.0 - 36.0 g/dL   RDW 13.4 11.5 - 15.5 %   Platelets 345 150 - 400 K/uL    Imaging / Studies: No results found.  Medications / Allergies:  Scheduled Meds: . atenolol  50 mg Oral Daily  .  ciprofloxacin  500 mg Oral BID  . doxazosin  2 mg Oral Daily  . feeding supplement  1 Container Oral TID BM  . feeding supplement (ENSURE ENLIVE)  237 mL Oral BID BM  . metroNIDAZOLE  500 mg Oral 3 times per day  . ondansetron (ZOFRAN) IV  4 mg Intravenous 4 times per day   Continuous Infusions: . sodium chloride 10 mL/hr at 08/27/15 1814   PRN Meds:.acetaminophen **OR** acetaminophen, HYDROmorphone (DILAUDID) injection, prochlorperazine  Antibiotics: Anti-infectives    Start     Dose/Rate Route Frequency Ordered Stop   08/26/15 1030  ciprofloxacin (CIPRO) tablet 500 mg     500 mg Oral 2 times daily 08/26/15 1016     08/26/15 1025  metroNIDAZOLE (FLAGYL) tablet 500 mg     500 mg Oral 3 times per day 08/26/15 1016     08/25/15 0330  ciprofloxacin (CIPRO) IVPB 400 mg  Status:  Discontinued     400 mg 200 mL/hr over 60 Minutes Intravenous Every 12 hours 08/24/15 1751 08/26/15 1016   08/25/15 0130  metroNIDAZOLE (FLAGYL) IVPB 500 mg  Status:  Discontinued     500 mg 100 mL/hr over 60 Minutes  Intravenous Every 8 hours 08/24/15 1751 08/26/15 1016   08/24/15 1515  ciprofloxacin (CIPRO) IVPB 400 mg     400 mg 200 mL/hr over 60 Minutes Intravenous  Once 08/24/15 1509 08/24/15 1657   08/24/15 1515  metroNIDAZOLE (FLAGYL) IVPB 500 mg     500 mg 100 mL/hr over 60 Minutes Intravenous  Once 08/24/15 1509 08/24/15 1834        Assessment/Plan Diverticulitis with abscess - S/P perc drain, cultures yielded microaerophilic strep, augmentin a better choice per pharmacy.  Stable for discharge.  Too early to rescan now.  Recommend IR follow up beginning of next week and with Dr. Donne Hazel at the end of next week, our office will arrange her surgical follow up.    Erby Pian, [facility] Pager [phone]) For consults and floor pages call [phone])  08/28/2015 10:52 AM

## 2015-08-28 NOTE — Progress Notes (Signed)
Patient ID: Elizabeth Mullins, female   DOB: 02-14-1932, 82 y.o.   MRN: [id number]   Pt will hear from IR drain clinic for follow up date and time  In chart

## 2015-08-28 NOTE — Care Management Note (Signed)
Case Management Note  Patient Details  Name: Elizabeth Mullins MRN: [id number] Date of Birth: 1932-04-25  Subjective/Objective:                 Patient admitted with diverticulitis and abd abces. Will DC to home with JP drain that will be removed as outpatient. Patient would like to use [facility] for [facility] RN   Action/Plan:  Will DC to home with [facility] RN. Referral made to [facility].  Expected Discharge Date:                  Expected Discharge Plan:  [place]  In-House Referral:     Discharge planning Services  CM Consult  Post Acute Care Choice:  Home Health Choice offered to:  Patient  DME Arranged:    DME Agency:     HH Arranged:  RN [place] Agency:  [place]  Status of Service:  Completed, signed off  Medicare Important Message Given:  Yes Date Medicare IM Given:    Medicare IM give by:    Date Additional Medicare IM Given:    Additional Medicare Important Message give by:     If discussed at [place] of Stay Meetings, dates discussed:    Additional Comments:  Carles Collet, RN 08/28/2015, 1:14 PM

## 2015-08-28 NOTE — Discharge Summary (Signed)
Discharge Summary  Elizabeth Mullins [id number] DOB: January 19, 1932  PCP: Idamae Schuller, MD  Admit date: 08/24/2015 Discharge date: 08/28/2015  Time spent: 25 minutes   Recommendations for Outpatient Follow-up:  1. New medication: Cipro 500 mg by mouth twice a day for at least the next 7 days 2. New medication: Flagyl 500 mg every 8 hours for at least the next 7 days 3. New medication: OxyIR 5 mg by mouth every 4 hours when necessary for pain  4. Patient will follow-up with general surgery in the next 7-10 days for repeat CT scan and at that time, decision will be made about discontinuing drain 5. Patient will need to have a repeat MRI versus CT scan of the pancreas in 1 year to follow-up on cystic mass. Her PCP will set this up  Discharge Diagnoses:  Active Hospital Problems   Diagnosis Date Noted  . Diverticulitis of colon with perforation 08/24/2015  . Intra-abdominal abscess ([place]) 08/24/2015  . HTN (hypertension) 08/24/2015  . Abnormal urinalysis 08/24/2015  . ?? Pancreatic mass 08/24/2015  . IBS (irritable bowel syndrome) 08/24/2015  . Inflammatory arthropathy ([place]) 08/24/2015  . Chronic back pain 08/24/2015    Resolved Hospital Problems   Diagnosis Date Noted Date Resolved  No resolved problems to display.    Discharge Condition: Improved, being discharged home   Diet recommendation: Low-sodium low residue diet   Filed Vitals:   08/28/15 0414 08/28/15 0920  BP: 132/60 129/71  Pulse: 82 97  Temp: 98.9 F (37.2 C)   Resp:      History of present illness:  82 year old female with past medical history ofdiverticulitis and hypertension admitted on 4/6 with several days of nausea vomiting and left lower quadrant abdominal pain.In the emergency room, noted to have white count of 15.9 and CT of imaging notes cystic mass in pancreas as well assigns of diverticulitis with secondary abscess. Patient admitted to hospitalist service on IV antibiotics.   Hospital Course:    Principal Problem:   Diverticulitis of colon with perforation and secondary abscess: Seen by general surgery and interventional radiology and patient underwent CT image guided drainage with drain placement on morning of 4/7. For the next few days, patient continued to improve. White count normalized. Changed over to by mouth antibiotics. By 4/10, patient tolerating solid food. She will discharge on a low residue diet with continued by mouth antibiotics. In the next 7-10 days, she'll follow-up with general surgery for repeat CT scan and if abscess resolved, drain to be removed Active Problems:    HTN (hypertension): Blood pressure stable    ?? Pancreatic mass: Incidentally noted on CT scan. Not felt to be malignant. Patient will need repeat CT versus MRI in one year. This will be set up by her PCP.    Procedures:  4/7: CT guided drainage of pelvic diverticular abscess with drain placement   Consultations:  Interventional radiology  General surgery   Discharge Exam: BP 129/71 mmHg  Pulse 97  Temp(Src) 98.9 F (37.2 C) (Oral)  Resp 19  SpO2 93%  General: Alert and oriented 3, no acute distress  Cardiovascular: Regular rate and rhythm, S1-S2  Respiratory: Clear to auscultation bilaterally   Discharge Instructions You were cared for by a hospitalist during your hospital stay. If you have any questions about your discharge medications or the care you received while you were in the hospital after you are discharged, you can call the unit and asked to speak with the hospitalist on call if the hospitalist that  took care of you is not available. Once you are discharged, your primary care physician will handle any further medical issues. Please note that NO REFILLS for any discharge medications will be authorized once you are discharged, as it is imperative that you return to your primary care physician (or establish a relationship with a primary care physician if you do not have one) for  your aftercare needs so that they can reassess your need for medications and monitor your lab values.  Discharge Instructions    Diet - low sodium heart healthy    Complete by:  As directed      Increase activity slowly    Complete by:  As directed             Medication List    TAKE these medications        atenolol 50 MG tablet  Commonly known as:  TENORMIN  Take 50 mg by mouth daily.     carisoprodol 350 MG tablet  Commonly known as:  SOMA  Take 350 mg by mouth 4 (four) times daily as needed for muscle spasms.     ciprofloxacin 500 MG tablet  Commonly known as:  CIPRO  Take 1 tablet (500 mg total) by mouth 2 (two) times daily.     doxazosin 2 MG tablet  Commonly known as:  CARDURA  Take 2 mg by mouth daily.     gabapentin 800 MG tablet  Commonly known as:  NEURONTIN  Take 800 mg by mouth 3 (three) times daily.     hydrochlorothiazide 25 MG tablet  Commonly known as:  HYDRODIURIL  Take 25 mg by mouth daily.     hyoscyamine 0.125 MG Tbdp disintergrating tablet  Commonly known as:  ANASPAZ  Place 0.125 mg under the tongue.     metroNIDAZOLE 500 MG tablet  Commonly known as:  FLAGYL  Take 1 tablet (500 mg total) by mouth 3 (three) times daily.     ondansetron 4 MG tablet  Commonly known as:  ZOFRAN  Take 1 tablet (4 mg total) by mouth every 6 (six) hours.     oxyCODONE 5 MG immediate release tablet  Commonly known as:  Oxy IR/ROXICODONE  Take 1 tablet (5 mg total) by mouth every 4 (four) hours as needed for severe pain.     raloxifene 60 MG tablet  Commonly known as:  EVISTA  Take 60 mg by mouth daily.       Allergies  Allergen Reactions  . Codeine Nausea And Vomiting  . Demerol [Meperidine] Nausea And Vomiting  . Dilaudid [Hydromorphone Hcl]     Hallucination   . Fentanyl     Hallucinations   . Morphine And Related     Nausea and vomiting   . Nsaids        Follow-up Information    Follow up with HENN, ADAM Thurmond Butts, MD In 10 days.    Specialty:  Interventional Radiology   Why:  pt will hear from IR drain clinic for follow up appt date and time   Contact information:   [address] [place] [address] [phone]       Follow up with [place].   Why:  for [facility] RN for drain mamagement. Will call in next 1-2 days to set up first [facility] apt.   Contact information:   [address] [place] [address] [phone]        The results of significant  diagnostics from this hospitalization (including imaging, microbiology, ancillary and laboratory) are listed below for reference.    Significant Diagnostic Studies: Ct Abdomen Pelvis W Contrast  08/24/2015  CLINICAL DATA:  Left lower quadrant abdominal pain EXAM: CT ABDOMEN AND PELVIS WITH CONTRAST TECHNIQUE: Multidetector CT imaging of the abdomen and pelvis was performed using the standard protocol following bolus administration of intravenous contrast. CONTRAST:  1 ISOVUE-300 IOPAMIDOL (ISOVUE-300) INJECTION 61% COMPARISON:  07/16/2015 FINDINGS: Lower chest and abdominal wall: Fatty Bochdalek's hernia on the right Hepatobiliary: Cystic density segment 2 lesion measuring 13 mm. Coarse parenchymal calcifications considered incidental.No evidence of biliary obstruction or stone. Pancreas: Ovoid low-density at the pancreatic neck measuring 21 mm in axial dimension, favor cystic pancreatic mass over focal dilatation the common bile duct given the normal diameter distally the pancreatic head. No malignant type enhancement or nodule. Spleen: Unremarkable. Adrenals/Urinary Tract: Negative adrenals. No hydronephrosis or stone. 1 cm upper pole cyst on the left. Right renal cortical scarring and thinning. Distorted urinary bladder via abscess. No pneumaturia or bladder wall thickening to suggest fistula. Reproductive:Hysterectomy. Stomach/Bowel: Inflammatory changes around the sigmoid colon have improved, but there is a new 60 x 33 x 35 mm thick walled  gas and fluid collection interposed between the sigmoid colon and bladder. Given previous findings and extensive diverticulosis, this is likely diverticular abscess. Large stool volume on a chronic basis. No pericecal inflammation. Vascular/Lymphatic: No acute vascular abnormality. No mass or adenopathy. Peritoneal: No ascites or pneumoperitoneum. Musculoskeletal: No acute abnormalities. Advanced disc and facet degeneration. IMPRESSION: 1. 6 cm abscess between the sigmoid colon and bladder, presumably diverticular given recent CT findings. 2. 21 mm cystic pancreatic head mass which could be re-evaluated in 1 year by MRI or CT. Additional chronic findings are described above Electronically Signed   By: Monte Fantasia M.D.   On: 08/24/2015 14:35   Ct Image Guided Drainage By Percutaneous Catheter  08/25/2015  INDICATION: 82 year old with diverticular pelvic abscess. EXAM: CT GUIDED DRAINAGE OF PELVIC ABSCESS MEDICATIONS: The patient is currently admitted to the hospital and receiving intravenous antibiotics. The antibiotics were administered within an appropriate time frame prior to the initiation of the procedure. ANESTHESIA/SEDATION: 2.0 mg IV Versed 100 mcg IV Fentanyl Moderate Sedation Time:  44 minutes The patient was continuously monitored during the procedure by the interventional radiology nurse under my direct supervision. COMPLICATIONS: None immediate. TECHNIQUE: Informed written consent was obtained from the patient after a thorough discussion of the procedural risks, benefits and alternatives. All questions were addressed. Maximal Sterile Barrier Technique was utilized including caps, mask, sterile gowns, sterile gloves, sterile drape, hand hygiene and skin antiseptic. A timeout was performed prior to the initiation of the procedure. PROCEDURE: Patient was initially placed in supine position and CT images through the pelvis were obtained. A safe percutaneous window was not identified for drainage of the  abscess from an anterior approach. Subsequently, patient was placed prone and repeat images through the pelvis were obtained. A percutaneous window from the left transgluteal approach was selected. The left side of the back was prepped with chlorhexidine and a sterile field was created. Skin was anesthetized with 1% lidocaine. Using CT guidance, an 18 gauge trocar needle was directed into the air-fluid collection just above the vaginal cuff. Needle was advanced into the air-fluid collection. Fluid was unable to be aspirated from this needle. A wire was advanced to confirm placement within the abscess. The tract was dilated to accommodate a 10.2 [place] multipurpose drain. 20 mL of clay  colored thick fluid was removed. Follow up CT images were obtained. Catheter was sutured to the skin and a bandage was placed. Catheter was attached to a suction bulb. FINDINGS: Air-fluid collection anterior to the rectum and just above the vaginal cuff. Catheter was successfully placed within this collection from a left transgluteal approach. Decompression of the abscess after catheter placement. 20 mL of thick gray colored fluid was removed. Fluid was sent for culture. IMPRESSION: Successful CT-guided drain placement in the pelvic abscess collection. Electronically Signed   By: Markus Daft M.D.   On: 08/25/2015 12:43    Microbiology: Recent Results (from the past 240 hour(s))  Urine culture     Status: None   Collection Time: 08/24/15 12:50 PM  Result Value Ref Range Status   Specimen Description URINE, RANDOM  Final   Special Requests NONE  Final   Culture MULTIPLE SPECIES PRESENT, SUGGEST RECOLLECTION  Final   Report Status 08/25/2015 FINAL  Final  Culture, blood (Routine X 2) w Reflex to ID Panel     Status: None (Preliminary result)   Collection Time: 08/24/15  5:00 PM  Result Value Ref Range Status   Specimen Description BLOOD LEFT ANTECUBITAL  Final   Special Requests BOTTLES DRAWN AEROBIC AND ANAEROBIC 10CC   Final   Culture NO GROWTH 4 DAYS  Final   Report Status PENDING  Incomplete  Culture, blood (Routine X 2) w Reflex to ID Panel     Status: None (Preliminary result)   Collection Time: 08/24/15  5:15 PM  Result Value Ref Range Status   Specimen Description BLOOD LEFT HAND  Final   Special Requests BOTTLES DRAWN AEROBIC ONLY 10CC  Final   Culture NO GROWTH 4 DAYS  Final   Report Status PENDING  Incomplete  Culture, routine-abscess     Status: None   Collection Time: 08/25/15 11:59 AM  Result Value Ref Range Status   Specimen Description ABSCESS PELVIS DRAINAGE  Final   Special Requests Normal  Final   Gram Stain   Final    ABUNDANT WBC PRESENT,BOTH PMN AND MONONUCLEAR NO SQUAMOUS EPITHELIAL CELLS SEEN ABUNDANT GRAM POSITIVE COCCI IN PAIRS Performed at [facility]    Culture   Final    ABUNDANT MICROAEROPHILIC STREPTOCOCCI Note: Standardized susceptibility testing for this organism is not available. Performed at [facility]    Report Status 08/28/2015 FINAL  Final     Labs: Basic Metabolic Panel:  Recent Labs Lab 08/24/15 1008 08/24/15 1820 08/25/15 0546 08/26/15 0739  NA 136  --  134* 138  K 4.6  --  3.7 3.5  CL 103  --  102 102  CO2 24  --  24 26  GLUCOSE 114*  --  112* 115*  BUN 16  --  14 9  CREATININE 0.70  --  0.80 0.65  CALCIUM 9.5  --  8.4* 8.5*  MG  --  2.1  --   --   PHOS  --  2.8  --   --    Liver Function Tests:  Recent Labs Lab 08/24/15 1008 08/25/15 0546  AST 19 21  ALT 16 19  ALKPHOS 58 47  BILITOT 0.4 0.6  PROT 7.0 5.9*  ALBUMIN 3.3* 2.8*    Recent Labs Lab 08/24/15 1008  LIPASE 19   No results for input(s): AMMONIA in the last 168 hours. CBC:  Recent Labs Lab 08/24/15 1008 08/25/15 0546 08/26/15 0739 08/27/15 0743  WBC 15.9* 12.3* 9.0 8.0  HGB 11.9* 11.1* 10.7* 11.2*  HCT 36.8 32.9* 33.9* 34.4*  MCV 91.5 92.4 92.4 90.8  PLT 323 290 320 345   Cardiac Enzymes: No results for input(s): CKTOTAL, CKMB,  CKMBINDEX, TROPONINI in the last 168 hours. BNP: BNP (last 3 results) No results for input(s): BNP in the last 8760 hours.  ProBNP (last 3 results) No results for input(s): PROBNP in the last 8760 hours.  CBG: No results for input(s): GLUCAP in the last 168 hours.     Signed:  Annita Brod  [facility] 08/28/2015, 2:28 PM

## 2015-08-28 NOTE — Care Management Important Message (Signed)
Important Message  Patient Details  Name: Elizabeth Mullins MRN: [id number] Date of Birth: [birth year]   Medicare Important Message Given:  Yes    [name] May 08/28/2015, 12:51 PM

## 2015-08-28 NOTE — Progress Notes (Signed)
Nsg Discharge Note  Admit Date:  08/24/2015 Discharge date: 08/28/2015   [name] to be D/C'd home per MD order.  AVS completed.  Copy for chart, and copy for patient signed, and dated. Patient able to verbalize understanding.  Discharge Medication:   Medication List    TAKE these medications        atenolol 50 MG tablet  Commonly known as:  TENORMIN  Take 50 mg by mouth daily.     carisoprodol 350 MG tablet  Commonly known as:  SOMA  Take 350 mg by mouth 4 (four) times daily as needed for muscle spasms.     ciprofloxacin 500 MG tablet  Commonly known as:  CIPRO  Take 1 tablet (500 mg total) by mouth 2 (two) times daily.     doxazosin 2 MG tablet  Commonly known as:  CARDURA  Take 2 mg by mouth daily.     gabapentin 800 MG tablet  Commonly known as:  NEURONTIN  Take 800 mg by mouth 3 (three) times daily.     hydrochlorothiazide 25 MG tablet  Commonly known as:  HYDRODIURIL  Take 25 mg by mouth daily.     hyoscyamine 0.125 MG Tbdp disintergrating tablet  Commonly known as:  ANASPAZ  Place 0.125 mg under the tongue.     metroNIDAZOLE 500 MG tablet  Commonly known as:  FLAGYL  Take 1 tablet (500 mg total) by mouth 3 (three) times daily.     ondansetron 4 MG tablet  Commonly known as:  ZOFRAN  Take 1 tablet (4 mg total) by mouth every 6 (six) hours.     oxyCODONE 5 MG immediate release tablet  Commonly known as:  Oxy IR/ROXICODONE  Take 1 tablet (5 mg total) by mouth every 4 (four) hours as needed for severe pain.     raloxifene 60 MG tablet  Commonly known as:  EVISTA  Take 60 mg by mouth daily.        Discharge Assessment: Filed Vitals:   08/28/15 0920 08/28/15 1438  BP: 129/71 119/58  Pulse: 97 86  Temp:  98 F (36.7 C)  Resp:  18   Skin clean, dry and intact without evidence of skin break down, no evidence of skin tears noted.  D/c Instructions-Education: Discharge instructions given to patient with verbalized understanding. D/c education  completed with patient including follow up instructions, medication list, d/c activities limitations if indicated, with other d/c instructions as indicated by MD - patient able to verbalize understanding, all questions fully answered. Patient instructed to return to ED, call 911, or call MD for any changes in condition.  Patient in room waiting for family member arrival to pick her up. Pt is to call Rn to escort her in wheelchair to main entrance once her daughter has arrived. Pt verbalized understanding. Pt up in chair. Chair wheels are locked. Call bell within reach.   Dorita Fray, RN 08/28/2015 3:51 PM

## 2015-08-29 LAB — CULTURE, BLOOD (ROUTINE X 2)
Culture: NO GROWTH
Culture: NO GROWTH

## 2015-09-06 ENCOUNTER — Ambulatory Visit
Admission: RE | Admit: 2015-09-06 | Discharge: 2015-09-06 | Disposition: A | Discharge status: Home / Self Care | Payer: Medicare Other | Source: Ambulatory Visit | Attending: General Surgery | Admitting: General Surgery

## 2015-09-06 ENCOUNTER — Ambulatory Visit
Admission: RE | Admit: 2015-09-06 | Discharge: 2015-09-06 | Disposition: A | Discharge status: Home / Self Care | Payer: Medicare Other | Source: Ambulatory Visit | Attending: Radiology | Admitting: Radiology

## 2015-09-06 ENCOUNTER — Other Ambulatory Visit: Payer: Self-pay | Admitting: General Surgery

## 2015-09-06 DIAGNOSIS — K5720 Diverticulitis of large intestine with perforation and abscess without bleeding: Secondary | ICD-10-CM

## 2015-09-06 DIAGNOSIS — K651 Peritoneal abscess: Secondary | ICD-10-CM

## 2015-09-06 MED ORDER — IOPAMIDOL (ISOVUE-300) INJECTION 61%
100.0000 mL | Freq: Once | INTRAVENOUS | Status: AC | PRN
  Administered 2015-09-06: 100 mL via INTRAVENOUS

## 2015-09-07 ENCOUNTER — Other Ambulatory Visit: Payer: Medicare Other

## 2015-09-19 ENCOUNTER — Inpatient Hospital Stay: Admission: RE | Admit: 2015-09-19 | Payer: Medicare Other | Source: Ambulatory Visit

## 2015-09-19 ENCOUNTER — Other Ambulatory Visit: Payer: Medicare Other

## 2015-09-20 ENCOUNTER — Other Ambulatory Visit: Payer: Self-pay | Admitting: Radiology

## 2015-09-20 ENCOUNTER — Ambulatory Visit
Admission: RE | Admit: 2015-09-20 | Discharge: 2015-09-20 | Disposition: A | Discharge status: Home / Self Care | Payer: Medicare Other | Source: Ambulatory Visit | Attending: General Surgery | Admitting: General Surgery

## 2015-09-20 ENCOUNTER — Other Ambulatory Visit (HOSPITAL_COMMUNITY): Payer: Self-pay | Admitting: Interventional Radiology

## 2015-09-20 DIAGNOSIS — K651 Peritoneal abscess: Secondary | ICD-10-CM

## 2015-09-20 DIAGNOSIS — K5720 Diverticulitis of large intestine with perforation and abscess without bleeding: Secondary | ICD-10-CM

## 2015-09-20 DIAGNOSIS — K5780 Diverticulitis of intestine, part unspecified, with perforation and abscess without bleeding: Secondary | ICD-10-CM

## 2015-09-20 HISTORY — PX: IR GENERIC HISTORICAL: IMG1180011

## 2015-09-20 MED ORDER — IOPAMIDOL (ISOVUE-300) INJECTION 61%
100.0000 mL | Freq: Once | INTRAVENOUS | Status: AC | PRN
  Administered 2015-09-20: 100 mL via INTRAVENOUS

## 2015-09-20 NOTE — Progress Notes (Signed)
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  Now for re CT and possible drain injection for evaluation of abscess Pt states she does feel some better Output minimal Thin brown fluid collected daily: 10 cc maybe Denies abdominal pain  CT performed 5/3; and reviewed with Dr Laurence Ferrari Unfortunately, existing abscess relatively unchanged from 09/06/15 and now with interval development of new abscess slightly higher/superior to this one. Will need drain manipulation Vs new placement per Dr Laurence Ferrari   Past Medical History  Diagnosis Date  . Diverticulitis   . Hypertension   . Scoliosis   . Allergic headache   . Arthritis     "all over"  . Chronic back pain     "top of my neck to bottom of my feet" (08/25/2015)    Past Surgical History  Procedure Laterality Date  . Appendectomy    . Tonsillectomy    . Total knee arthroplasty Right   . Ct guide abscess drainage  ([facility] hx)  08/25/2015    pelvic diverticular abscess  . Joint replacement    . Vaginal hysterectomy      "partial"  . Dilation and curettage of uterus      Allergies: Codeine; Demerol; Dilaudid; Fentanyl; Morphine and related; and Nsaids  Medications: Prior to Admission medications   Medication Sig Start Date End Date Taking? Authorizing Provider  atenolol (TENORMIN) 50 MG tablet Take 50 mg by mouth daily.    Historical Provider, MD  carisoprodol (SOMA) 350 MG tablet Take 350 mg by mouth 4 (four) times daily as needed for muscle spasms.    Historical Provider, MD  ciprofloxacin (CIPRO) 500 MG tablet Take 1 tablet  (500 mg total) by mouth 2 (two) times daily. 08/28/15   Annita Brod, MD  doxazosin (CARDURA) 2 MG tablet Take 2 mg by mouth daily.    Historical Provider, MD  gabapentin (NEURONTIN) 800 MG tablet Take 800 mg by mouth 3 (three) times daily.    Historical Provider, MD  hydrochlorothiazide (HYDRODIURIL) 25 MG tablet Take 25 mg by mouth daily.    Historical Provider, MD  hyoscyamine (ANASPAZ) 0.125 MG TBDP disintergrating tablet Place 0.125 mg under the tongue.    Historical Provider, MD  metroNIDAZOLE (FLAGYL) 500 MG tablet Take 1 tablet (500 mg total) by mouth 3 (three) times daily. 08/28/15   Annita Brod, MD  ondansetron (ZOFRAN) 4 MG tablet Take 1 tablet (4 mg total) by mouth every 6 (six) hours. 07/16/15   Quintella Reichert, MD  oxyCODONE (OXY IR/ROXICODONE) 5 MG immediate release tablet Take 1 tablet (5 mg total) by mouth every 4 (four) hours as needed for severe pain. 08/28/15   Annita Brod, MD  raloxifene (EVISTA) 60 MG tablet Take 60 mg by mouth daily.    Historical Provider, MD     No family history on file.  Social History   Social History  . Marital Status: Widowed    Spouse Name: N/A  . Number of Children: N/A  . Years of Education: N/A   Social History Main Topics  . Smoking status: Never Smoker   . Smokeless tobacco: Never Used  . Alcohol Use: No  .  Drug Use: No  . Sexual Activity: No   Other Topics Concern  . Not on file   Social History Narrative     Vital Signs: BP 141/69 mmHg  Pulse 73  Temp(Src) 97.9 F (36.6 C) (Oral)  SpO2 94%  Physical Exam  Abdominal: Soft. Bowel sounds are normal. There is no tenderness.  Skin: Skin is warm and dry.  Site of existing abscess drain  Intact; no bleeding; no sign of infection small amount thin brown fluid collected    Imaging: No results found.  Labs:  CBC:  Recent Labs  08/24/15 1008 08/25/15 0546 08/26/15 0739 08/27/15 0743  WBC 15.9* 12.3* 9.0 8.0  HGB 11.9* 11.1* 10.7* 11.2*  HCT 36.8  32.9* 33.9* 34.4*  PLT 323 290 320 345    COAGS:  Recent Labs  08/24/15 1704  INR 1.09    BMP:  Recent Labs  07/16/15 1620 08/24/15 1008 08/25/15 0546 08/26/15 0739  NA 131* 136 134* 138  K 3.9 4.6 3.7 3.5  CL 98* 103 102 102  CO2 27 24 24 26   GLUCOSE 101* 114* 112* 115*  BUN 17 16 14 9   CALCIUM 8.8* 9.5 8.4* 8.5*  CREATININE 0.71 0.70 0.80 0.65  GFRNONAA >60 >60 >60 >60  GFRAA >60 >60 >60 >60    LIVER FUNCTION TESTS:  Recent Labs  07/16/15 1620 08/24/15 1008 08/25/15 0546  BILITOT 0.3 0.4 0.6  AST 25 19 21   ALT 16 16 19   ALKPHOS 45 58 47  PROT 7.0 7.0 5.9*  ALBUMIN 3.9 3.3* 2.8*    Assessment:  Diverticular abscess drain placed 08/25/15 Recheck with injection 09/06/15: + fistula to colon Recheck today: CT reveals unchanged collection with new superior collection per Dr Laurence Ferrari Plan: pt scheduled to come to [facility] 5/4 for drain manipulation vs new drain placement. Pt and daughter aware and agreeable Plan to be in [facility] 830 am 09/21/15; npo; needs driver   Signed: Richard Holz A 09/20/2015, 11:07 AM   Please refer to Dr. Anselm Pancoast attestation of this note for management and plan.

## 2015-09-21 ENCOUNTER — Encounter (HOSPITAL_COMMUNITY): Payer: Self-pay

## 2015-09-21 ENCOUNTER — Other Ambulatory Visit (HOSPITAL_COMMUNITY): Payer: Self-pay | Admitting: Diagnostic Radiology

## 2015-09-21 ENCOUNTER — Other Ambulatory Visit (HOSPITAL_COMMUNITY): Payer: Self-pay | Admitting: Interventional Radiology

## 2015-09-21 ENCOUNTER — Ambulatory Visit (HOSPITAL_COMMUNITY)
Admission: RE | Admit: 2015-09-21 | Discharge: 2015-09-21 | Disposition: A | Discharge status: Home / Self Care | Payer: Medicare Other | Source: Ambulatory Visit | Attending: Diagnostic Radiology | Admitting: Diagnostic Radiology

## 2015-09-21 ENCOUNTER — Ambulatory Visit (HOSPITAL_COMMUNITY)
Admission: RE | Admit: 2015-09-21 | Discharge: 2015-09-21 | Disposition: A | Discharge status: Home / Self Care | Payer: Medicare Other | Source: Ambulatory Visit | Attending: Interventional Radiology | Admitting: Interventional Radiology

## 2015-09-21 DIAGNOSIS — Z885 Allergy status to narcotic agent status: Secondary | ICD-10-CM | POA: Diagnosis not present

## 2015-09-21 DIAGNOSIS — K632 Fistula of intestine: Secondary | ICD-10-CM | POA: Diagnosis not present

## 2015-09-21 DIAGNOSIS — Z79899 Other long term (current) drug therapy: Secondary | ICD-10-CM | POA: Diagnosis not present

## 2015-09-21 DIAGNOSIS — L0291 Cutaneous abscess, unspecified: Secondary | ICD-10-CM

## 2015-09-21 DIAGNOSIS — K5720 Diverticulitis of large intestine with perforation and abscess without bleeding: Secondary | ICD-10-CM | POA: Insufficient documentation

## 2015-09-21 DIAGNOSIS — K5780 Diverticulitis of intestine, part unspecified, with perforation and abscess without bleeding: Secondary | ICD-10-CM | POA: Diagnosis present

## 2015-09-21 DIAGNOSIS — I10 Essential (primary) hypertension: Secondary | ICD-10-CM | POA: Insufficient documentation

## 2015-09-21 LAB — CBC
HCT: 38.2 % (ref 36.0–46.0)
HEMOGLOBIN: 12.5 g/dL (ref 12.0–15.0)
MCH: 30.6 pg (ref 26.0–34.0)
MCHC: 32.7 g/dL (ref 30.0–36.0)
MCV: 93.6 fL (ref 78.0–100.0)
Platelets: 525 10*3/uL — ABNORMAL HIGH (ref 150–400)
RBC: 4.08 MIL/uL (ref 3.87–5.11)
RDW: 14.5 % (ref 11.5–15.5)
WBC: 7.5 10*3/uL (ref 4.0–10.5)

## 2015-09-21 LAB — PROTIME-INR
INR: 1.05 (ref 0.00–1.49)
PROTHROMBIN TIME: 13.9 s (ref 11.6–15.2)

## 2015-09-21 LAB — APTT: APTT: 29 s (ref 24–37)

## 2015-09-21 MED ORDER — FENTANYL CITRATE (PF) 100 MCG/2ML IJ SOLN
INTRAMUSCULAR | Status: AC
  Filled 2015-09-21: qty 2

## 2015-09-21 MED ORDER — MIDAZOLAM HCL 2 MG/2ML IJ SOLN
INTRAMUSCULAR | Status: AC | PRN
  Administered 2015-09-21: 1 mg via INTRAVENOUS
  Administered 2015-09-21: 0.5 mg via INTRAVENOUS
  Administered 2015-09-21: 1 mg via INTRAVENOUS
  Administered 2015-09-21: 0.5 mg via INTRAVENOUS

## 2015-09-21 MED ORDER — FENTANYL CITRATE (PF) 100 MCG/2ML IJ SOLN
INTRAMUSCULAR | Status: AC
  Filled 2015-09-21: qty 4

## 2015-09-21 MED ORDER — IOPAMIDOL (ISOVUE-300) INJECTION 61%
INTRAVENOUS | Status: AC
  Administered 2015-09-21: 30 mL
  Filled 2015-09-21: qty 50

## 2015-09-21 MED ORDER — MIDAZOLAM HCL 2 MG/2ML IJ SOLN
INTRAMUSCULAR | Status: AC
  Filled 2015-09-21: qty 4

## 2015-09-21 MED ORDER — SODIUM CHLORIDE 0.9 % IV SOLN: Freq: Once | INTRAVENOUS | Status: DC

## 2015-09-21 MED ORDER — LIDOCAINE HCL 1 % IJ SOLN
INTRAMUSCULAR | Status: AC
  Filled 2015-09-21: qty 20

## 2015-09-21 MED ORDER — FENTANYL CITRATE (PF) 100 MCG/2ML IJ SOLN
INTRAMUSCULAR | Status: AC | PRN
  Administered 2015-09-21 (×2): 50 ug via INTRAVENOUS
  Administered 2015-09-21 (×2): 25 ug via INTRAVENOUS

## 2015-09-21 MED ORDER — LIDOCAINE HCL 1 % IJ SOLN
INTRAMUSCULAR | Status: AC
  Administered 2015-09-21: 10 mL
  Filled 2015-09-21: qty 20

## 2015-09-21 NOTE — Sedation Documentation (Signed)
Moved to CT 

## 2015-09-21 NOTE — Discharge Instructions (Signed)
[place] A  drain consists of a thin rubber tube. The rubber tube is placed in the area where you had surgery. A bulb is attached to the end of the tube that is outside the body. The  drain removes excess fluid that normally builds up in a surgical wound after surgery. The color and amount of fluid will vary. It may gradually change to a yellow or pink color and become more thin and water-like.   Keep sites where the tube enters the skin dry and covered with a bandage (dressing).  Secure the tube 1-2 in (2.5-5.1 cm) below the insertion sites to keep it from pulling on your stitches. The tube is stitched in place and will not slip out.  Secure the bulb as directed by your health care provider. EMPTYING THE DRAIN Before emptying the bulb, get a measuring cup, a piece of paper and a pen, and wash your hands.  Gently run your fingers down the tube (stripping) to empty any drainage from the tubing into the bulb. This may need to be done several times a day to clear the tubing of clots and tissue.   Do not touch the inside of the cap.   Write down the amount of fluid you emptied out. Write down the date and each time you emptied your bulb drain.  Flush the fluid down the toilet and wash your hands.  If there is drainage around the tube site, change dressings and keep the area dry. Cleanse around tube with sterile saline and place dry gauze around site. This gauze should be changed when it is soiled. If it stays clean and unsoiled, it should still be changed daily.  SEEK MEDICAL CARE IF:  Your drainage has a bad smell or is cloudy.   You have a fever.   Your drainage is increasing instead of decreasing.   Your tube fell out.   You have redness or swelling around the tube site.   You have drainage from a surgical wound.   Your bulb drain will not stay flat after you empty it.  MAKE SURE YOU:   Understand these instructions.  Will watch your condition.  Will get help  right away if you are not doing well or get worse.   This information is not intended to replace advice given to you by your health care provider. Make sure you discuss any questions you have with your health care provider.   Document Released: 05/03/2000 Document Revised: 05/27/2014 Document Reviewed: 11/23/2014 Elsevier Interactive Patient Education [employer].

## 2015-09-21 NOTE — Sedation Documentation (Signed)
Pt and daughter very verbal about pain during last drain placement.  Chart reviewed.  Did receive 2 of versed and 100 fentanyl.  Will monitor closely for pain control today.

## 2015-09-21 NOTE — Sedation Documentation (Signed)
Patient is resting comfortably. 

## 2015-09-21 NOTE — Procedures (Signed)
Drain injection demonstrated fistula connection to sigmoid colon. Unable to find a pathway to the adjacent abscess.  The transgluteal drain was exchanged.  Patient was transferred to CT and attempted placement of new transgluteal drain.  A needle was advanced to the inferior aspect of the abscess but unable to puncture abscess due to adjacent bowel.  Unable to place new drain.  Spoke with Dr. Donne Hazel and we will restart antibiotics and set up Surgery clinical visit.  Continue drainage from existing drain.

## 2015-09-21 NOTE — Sedation Documentation (Signed)
MD at bedside. 

## 2015-09-21 NOTE — Sedation Documentation (Signed)
o2 d/c'd 

## 2015-09-21 NOTE — Sedation Documentation (Signed)
Time Out again for procedure now in CT

## 2015-09-21 NOTE — Sedation Documentation (Signed)
Family updated as to patient's status.

## 2015-09-21 NOTE — H&P (Signed)
Chief Complaint: Patient was seen in consultation today for drain injection/manipulation at the request of [place]  Referring Physician(s): [place]  Supervising Physician: Markus Daft  Patient Status: Out-pt  History of Present Illness: Elizabeth Mullins is a 82 y.o. female who is s/p Successful CT-guided drain placement in the pelvic abscess collection 08/25/2015.   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  Pt states she does feel some better Output minimal Thin brown fluid collected daily: 10 cc maybe Denies abdominal pain  CT performed 5/3; and reviewed with Dr Laurence Ferrari Unfortunately, existing abscess relatively unchanged from 09/06/15 and now with interval development of new abscess slightly higher/superior to this one. Will need drain manipulation Vs new placement per Dr Laurence Ferrari  Past Medical History  Diagnosis Date  . Diverticulitis   . Hypertension   . Scoliosis   . Allergic headache   . Arthritis     "all over"  . Chronic back pain     "top of my neck to bottom of my feet" (08/25/2015)    Past Surgical History  Procedure Laterality Date  . Appendectomy    . Tonsillectomy    . Total knee arthroplasty Right   . Ct guide abscess drainage  ([facility] hx)  08/25/2015    pelvic diverticular abscess  . Joint replacement    . Vaginal hysterectomy      "partial"  . Dilation and curettage of uterus      Allergies: Codeine; Demerol; Dilaudid; Fentanyl; Morphine and related; and Nsaids  Medications: Prior to Admission medications   Medication Sig Start Date End Date Taking? Authorizing Provider  atenolol (TENORMIN) 50 MG tablet Take 50 mg by mouth daily.   Yes Historical Provider, MD  carisoprodol (SOMA) 350 MG tablet Take 350 mg by mouth 4 (four) times daily as  needed for muscle spasms.   Yes Historical Provider, MD  doxazosin (CARDURA) 2 MG tablet Take 2 mg by mouth daily.   Yes Historical Provider, MD  gabapentin (NEURONTIN) 800 MG tablet Take 800 mg by mouth 3 (three) times daily.   Yes Historical Provider, MD  hyoscyamine (ANASPAZ) 0.125 MG TBDP disintergrating tablet Place 0.125 mg under the tongue 3 (three) times daily as needed for cramping.    Yes Historical Provider, MD  Omega-3 Fatty Acids (FISH OIL) 1000 MG CAPS Take 1 capsule by mouth daily.   Yes Historical Provider, MD  raloxifene (EVISTA) 60 MG tablet Take 60 mg by mouth daily.   Yes Historical Provider, MD  Red Yeast Rice 600 MG CAPS Take 1 capsule by mouth daily.   Yes Historical Provider, MD  thiamine (VITAMIN B-1) 100 MG tablet Take 100 mg by mouth daily.   Yes Historical Provider, MD  vitamin E (VITAMIN E) 400 UNIT capsule Take 400 Units by mouth daily.   Yes Historical Provider, MD  hydrochlorothiazide (HYDRODIURIL) 25 MG tablet Take 25 mg by mouth daily as needed (Feet and Leg swelling).     Historical Provider, MD     History reviewed. No pertinent family history.  Social History   Social History  . Marital Status: Widowed    Spouse Name: N/A  . Number of Children: N/A  . Years of Education: N/A   Social History Main Topics  . Smoking status: Never Smoker   . Smokeless tobacco: Never Used  . Alcohol Use: No  .  Drug Use: No  . Sexual Activity: No   Other Topics Concern  . None   Social History Narrative      Review of Systems: A 12 point ROS discussed and pertinent positives are indicated in the HPI above.  All other systems are negative.  Review of Systems  Vital Signs: BP 141/73 mmHg  Pulse 74  Temp(Src) 98.5 F (36.9 C)  Resp 16  Ht 4\' 11"  (1.499 m)  Wt 140 lb (63.504 kg)  BMI 28.26 kg/m2  SpO2 100%  Physical Exam  Constitutional: She is oriented to person, place, and time. She appears well-developed and well-nourished. No distress.    Cardiovascular: Normal rate, regular rhythm and normal heart sounds.   Pulmonary/Chest: Effort normal and breath sounds normal. No respiratory distress.  Neurological: She is alert and oriented to person, place, and time.  Skin:  (L)transgluteal drain intact  Psychiatric: She has a normal mood and affect. Judgment normal.    Mallampati Score:  MD Evaluation Airway: WNL Heart: WNL Abdomen: WNL Chest/ Lungs: WNL ASA  Classification: 2 Mallampati/Airway Score: One  Imaging: Ct Abdomen Pelvis W Contrast  09/20/2015  CLINICAL DATA:  Follow-up diverticular abscess. EXAM: CT ABDOMEN AND PELVIS WITH CONTRAST TECHNIQUE: Multidetector CT imaging of the abdomen and pelvis was performed using the standard protocol following bolus administration of intravenous contrast. CONTRAST:  164mL ISOVUE-300 IOPAMIDOL (ISOVUE-300) INJECTION 61% COMPARISON:  09/06/2015 FINDINGS: Lower chest: Few pleural-based densities along the right lower lobe appear to be new and probably related to atelectasis. No large pleural effusions. Hepatobiliary: Again noted is a lobulated low-density structure in left hepatic lobe roughly measuring 1.4 cm and most likely represents a cyst. Otherwise, normal appearance of the liver without biliary dilatation. Normal appearance of the gallbladder without inflammatory changes. Portal venous system is patent. Pancreas: There continues to be a low-density lesion at the pancreatic head measuring 2.1 x 1.5 cm. No significant biliary dilatation. No inflammation around the pancreas. Spleen: Stable appearance of spleen without enlargement. Adrenals/Urinary Tract: Normal adrenal glands. Again noted is a low-density structure along the left kidney upper pole which probably represents a cyst this may be hyperdense or complex. No suspicious renal findings. No hydronephrosis. Normal appearance of the urinary bladder. Stomach/Bowel: Left transgluteal drainage catheter in the pelvis. Enlarging fluid  collection extending cephalad from the pigtail drainage catheter. This fluid collection appears to be mildly loculated or complex. This collection measures 5.3 x 3.5 x 3.8 cm. There is a new small low-density fluid collection located right of the drain, measuring 1.5 cm, which may be connected to the larger more cephalad collection. Small amount of gas within this new fluid cavity and findings are compatible with a new or enlarging abscess collection. Again noted is extensive diverticulosis in the sigmoid colon and there continues to be inflammation along the posterior aspect the sigmoid colon which has minimally improved. No evidence for bowel dilatation or obstruction. Wall thickening at the gastric cardia or GE junction which is similar to prior examinations. This finding may be related to a small hiatal hernia but indeterminate. Vascular/Lymphatic: Atherosclerotic disease in the abdominal aorta without aneurysm. No significant lymphadenopathy abdomen or pelvis. Reproductive: Uterus has been surgically removed. Other: Left inguinal hernia containing fat. Musculoskeletal: Scoliosis in lumbar spine with multilevel degenerative disease. IMPRESSION: New complex fluid collection(s) around the pelvic drainage catheter and adjacent to the sigmoid colon. Findings are suggestive for recurrent or new diverticular abscesses. Decreasing but persistent inflammatory changes around the sigmoid colon compatible with diverticulitis.  Persistent 2.1 cm low-density structure at the pancreatic head which could represent a cystic lesion. Recommend further characterization with an abdominal MRI. Persistent wall thickening at the gastroesophageal junction. Findings may be associated with a hiatal hernia but cannot exclude inflammation or wall thickening in this area. Electronically Signed   By: Markus Daft M.D.   On: 09/20/2015 13:29   Ct Abdomen Pelvis W Contrast  09/06/2015  CLINICAL DATA:  Diverticular abscess, post percutaneous  drain catheter placement 08/25/2015. Less than 10 cc output per day. No flushing. On bulb. EXAM: CT ABDOMEN AND PELVIS WITH CONTRAST TECHNIQUE: Multidetector CT imaging of the abdomen and pelvis was performed using the standard protocol following bolus administration of intravenous contrast. CONTRAST:  142mL ISOVUE-300 IOPAMIDOL (ISOVUE-300) INJECTION 61% COMPARISON:  08/25/2015 and previous FINDINGS: Lower chest:  No acute findings. Hepatobiliary: No masses . A few scattered calcified granulomas. Stable segment 3 hepatic cyst. Gallbladder physiologically distended. Focal dilatation of the distal CBD versus adjacent cystic lesion in the pancreatic head, as previously described. Pancreas: No mass, inflammatory changes, or other significant abnormality. Spleen: Within normal limits in size and appearance. Adrenals/Urinary Tract: No masses identified. No evidence of hydronephrosis. Stable left upper pole renal cyst. Urinary bladder incompletely distended. Stomach/Bowel: Stomach and small bowel are decompressed. Appendix not identified. Moderate fecal material in the proximal colon. Scattered descending and sigmoid diverticula. There has been some increase in inflammatory change adjacent to the proximal and mid sigmoid colon, and there is a poorly marginated 2.5 cm fluid collection at the medial aspect of the inflammation, posterior to the mid sigmoid colon. The percutaneous drain catheter has evacuated the previously noted abscess between the mid sigmoid colon and urinary bladder. There are Gas bubbles in the deep subcutaneous tissues of the right gluteal region around the drain catheter. Vascular/Lymphatic: No pathologically enlarged lymph nodes. Scattered calcified aortic plaque. No evidence of abdominal aortic aneurysm. Reproductive: Previous hysterectomy. Other: No ascites.  No free air. Musculoskeletal: Thoracolumbar scoliosis with multilevel degenerative changes in the visualized lower thoracic and lumbar spine.  IMPRESSION: 1. Interval resolution of abscess post drain catheter placement. 2. Some increase in inflammatory/edematous changes adjacent to the proximal and mid sigmoid colon, with the an adjacent poorly marginated 2.5 cm fluid attenuation collection medially. Electronically Signed   By: Lucrezia Europe M.D.   On: 09/06/2015 11:33   Ct Abdomen Pelvis W Contrast  08/24/2015  CLINICAL DATA:  Left lower quadrant abdominal pain EXAM: CT ABDOMEN AND PELVIS WITH CONTRAST TECHNIQUE: Multidetector CT imaging of the abdomen and pelvis was performed using the standard protocol following bolus administration of intravenous contrast. CONTRAST:  1 ISOVUE-300 IOPAMIDOL (ISOVUE-300) INJECTION 61% COMPARISON:  07/16/2015 FINDINGS: Lower chest and abdominal wall: Fatty Bochdalek's hernia on the right Hepatobiliary: Cystic density segment 2 lesion measuring 13 mm. Coarse parenchymal calcifications considered incidental.No evidence of biliary obstruction or stone. Pancreas: Ovoid low-density at the pancreatic neck measuring 21 mm in axial dimension, favor cystic pancreatic mass over focal dilatation the common bile duct given the normal diameter distally the pancreatic head. No malignant type enhancement or nodule. Spleen: Unremarkable. Adrenals/Urinary Tract: Negative adrenals. No hydronephrosis or stone. 1 cm upper pole cyst on the left. Right renal cortical scarring and thinning. Distorted urinary bladder via abscess. No pneumaturia or bladder wall thickening to suggest fistula. Reproductive:Hysterectomy. Stomach/Bowel: Inflammatory changes around the sigmoid colon have improved, but there is a new 60 x 33 x 35 mm thick walled gas and fluid collection interposed between the sigmoid colon and bladder.  Given previous findings and extensive diverticulosis, this is likely diverticular abscess. Large stool volume on a chronic basis. No pericecal inflammation. Vascular/Lymphatic: No acute vascular abnormality. No mass or adenopathy.  Peritoneal: No ascites or pneumoperitoneum. Musculoskeletal: No acute abnormalities. Advanced disc and facet degeneration. IMPRESSION: 1. 6 cm abscess between the sigmoid colon and bladder, presumably diverticular given recent CT findings. 2. 21 mm cystic pancreatic head mass which could be re-evaluated in 1 year by MRI or CT. Additional chronic findings are described above Electronically Signed   By: Monte Fantasia M.D.   On: 08/24/2015 14:35   Dg Sinus/fist Tube Chk-non Gi  09/06/2015  CLINICAL DATA:  Diverticulitis of large intestine with abscess without bleeding/intra abdominal abscess. Percutaneous drain catheter placed in diverticular abscess 08/25/2015. Minimal output. EXAM: ABSCESS DRAIN CATHETER INJECTION UNDER FLUOROSCOPY TECHNIQUE: The procedure, risks (including but not limited to bleeding, infection, organ damage ), benefits, and alternatives were explained to the patient. Questions regarding the procedure were encouraged and answered. The patient understands and consents to the procedure. Survey fluoroscopic inspection reveals stable position of the pigtail drain catheter in the left pelvis. Injection initially demonstrated occlusion of the drain catheter. This was cleared with gentle aspiration of sterile saline using a 3 mL syringe. Contrast injection under fluoroscopy demonstrated no significant residual abscess cavity. However, there was opacification of a patent fistula to the mid sigmoid colon. IMPRESSION: 1. 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 Electronically Signed   By: Lucrezia Europe M.D.   On: 09/06/2015 11:57   Ct Image Guided Drainage By Percutaneous Catheter  08/25/2015  INDICATION: 82 year old with diverticular pelvic abscess. EXAM: CT GUIDED DRAINAGE OF PELVIC ABSCESS MEDICATIONS: The patient is currently admitted to the hospital and receiving intravenous antibiotics. The antibiotics were administered within an appropriate time  frame prior to the initiation of the procedure. ANESTHESIA/SEDATION: 2.0 mg IV Versed 100 mcg IV Fentanyl Moderate Sedation Time:  44 minutes The patient was continuously monitored during the procedure by the interventional radiology nurse under my direct supervision. COMPLICATIONS: None immediate. TECHNIQUE: Informed written consent was obtained from the patient after a thorough discussion of the procedural risks, benefits and alternatives. All questions were addressed. Maximal Sterile Barrier Technique was utilized including caps, mask, sterile gowns, sterile gloves, sterile drape, hand hygiene and skin antiseptic. A timeout was performed prior to the initiation of the procedure. PROCEDURE: Patient was initially placed in supine position and CT images through the pelvis were obtained. A safe percutaneous window was not identified for drainage of the abscess from an anterior approach. Subsequently, patient was placed prone and repeat images through the pelvis were obtained. A percutaneous window from the left transgluteal approach was selected. The left side of the back was prepped with chlorhexidine and a sterile field was created. Skin was anesthetized with 1% lidocaine. Using CT guidance, an 18 gauge trocar needle was directed into the air-fluid collection just above the vaginal cuff. Needle was advanced into the air-fluid collection. Fluid was unable to be aspirated from this needle. A wire was advanced to confirm placement within the abscess. The tract was dilated to accommodate a 10.2 [place] multipurpose drain. 20 mL of clay colored thick fluid was removed. Follow up CT images were obtained. Catheter was sutured to the skin and a bandage was placed. Catheter was attached to a suction bulb. FINDINGS: Air-fluid collection anterior to the rectum and just above the vaginal cuff. Catheter was successfully placed within this collection from a left  transgluteal approach. Decompression of the abscess after catheter  placement. 20 mL of thick gray colored fluid was removed. Fluid was sent for culture. IMPRESSION: Successful CT-guided drain placement in the pelvic abscess collection. Electronically Signed   By: Markus Daft M.D.   On: 08/25/2015 12:43    Labs:  CBC:  Recent Labs  08/25/15 0546 08/26/15 0739 08/27/15 0743 09/21/15 0911  WBC 12.3* 9.0 8.0 7.5  HGB 11.1* 10.7* 11.2* 12.5  HCT 32.9* 33.9* 34.4* 38.2  PLT 290 320 345 525*    COAGS:  Recent Labs  08/24/15 1704 09/21/15 0911  INR 1.09 1.05  APTT  --  29    BMP:  Recent Labs  07/16/15 1620 08/24/15 1008 08/25/15 0546 08/26/15 0739  NA 131* 136 134* 138  K 3.9 4.6 3.7 3.5  CL 98* 103 102 102  CO2 27 24 24 26   GLUCOSE 101* 114* 112* 115*  BUN 17 16 14 9   CALCIUM 8.8* 9.5 8.4* 8.5*  CREATININE 0.71 0.70 0.80 0.65  GFRNONAA >60 >60 >60 >60  GFRAA >60 >60 >60 >60    LIVER FUNCTION TESTS:  Recent Labs  07/16/15 1620 08/24/15 1008 08/25/15 0546  BILITOT 0.3 0.4 0.6  AST 25 19 21   ALT 16 16 19   ALKPHOS 45 58 47  PROT 7.0 7.0 5.9*  ALBUMIN 3.9 3.3* 2.8*    TUMOR MARKERS: No results for input(s): AFPTM, CEA, CA199, CHROMGRNA in the last 8760 hours.  Assessment and Plan: Diverticular abscess drain placed 08/25/15 Recheck with injection 09/06/15: + fistula to colon Plan: pt scheduled to come to [facility] 5/4 for drain manipulation vs new drain placement. Risks and Benefits discussed with the patient including bleeding, infection, damage to adjacent structures, bowel perforation/fistula connection, and sepsis. All of the patient's questions were answered, patient is agreeable to proceed. Consent signed and in chart.   Pt and daughter aware and agreeable   Electronically Signed: Ascencion Dike 09/21/2015, 10:28 AM   I spent a total of 20 minutesin face to face in clinical consultation, greater than 50% of which was counseling/coordinating care for drain manipulation

## 2015-09-25 ENCOUNTER — Encounter: Payer: Self-pay | Admitting: Surgery

## 2015-09-25 ENCOUNTER — Ambulatory Visit: Payer: Self-pay | Admitting: Surgery

## 2015-09-25 NOTE — H&P (Signed)
Elizabeth Mullins 09/25/2015 11:12 AM Location: [facility] Patient #: [id number] DOB: Jun 02, 1931 Widowed / Language: Elizabeth Mullins / Race: White Female  History of Present Illness Elizabeth Hector MD; 09/25/2015 1:56 PM) The patient is a 82 year old female who presents with diverticulitis. Note for "Diverticulitis": Patient comes the office to consider surgery for diverticulitis with abscess and now chronic fistula controlled with drain.  Elderly active woman. Strong family history of colon problems such as diverticulosis and diverticulitis and colon polyps. Usually gets colonoscopy every 5 years. Last 14 years ago. Negative. Relocated from [place] 3 years ago. Has had lower abdominal pains over the past year. Presumed diverticulitis. Had been on at least 4 rounds of oral Cipro/Flagyl. Some improvement but would keep coming back. Then she felt worse. Admitted for diverticulitis with abscess early April. Has some arthropathies and recently had been started on a prednisone taper. Admitted April 6 with 6 cm abscess. Percutaneously drained. Improved. Discharged 4 days later. Follow-up drain study shows fistula to colon in the abscess cavity. Repeat CT scan shows new abscess. Not able to find a safe window for percutaneous drainage. Restarted on oral antibiotics. A little better. Occasionally has pain. Oxycodone didn't help. Norco does. Has run out. She comes today with her daughter. They have many questions.  She normally is up and very active. Can walk several miles without any difficulty. However this is been frustrating for her. Her appetite is down. She's lost over 10 pounds. Energy level is down. She is to have a bowel movement every day but now has been more constipated. Usually probiotic and MiraLAX have helped. She does not smoke. She is not a diabetic. Had appendectomy and hysterectomy many decades ago. None since. No personal nor family history of GI/colon  cancer, inflammatory bowel disease, irritable bowel syndrome, allergy such as Celiac Sprue, dietary/dairy problems, colitis, ulcers nor gastritis. No recent sick contacts/gastroenteritis. No travel outside the country. No changes in diet. No dysphagia to solids or liquids. No significant heartburn or reflux. No hematochezia, hematemesis, coffee ground emesis. No evidence of prior gastric/peptic ulceration.   Other Problems Elizabeth Mullins, CMA; 09/25/2015 11:13 AM) Back Pain Diverticulosis High blood pressure  Past Surgical History Elizabeth Mullins, CMA; 09/25/2015 11:13 AM) Appendectomy Breast Biopsy Right. Hysterectomy (not due to cancer) - Partial Knee Surgery Right. Tonsillectomy  Diagnostic Studies History Elizabeth Mullins, CMA; 09/25/2015 11:13 AM) Colonoscopy 5-10 years ago  Allergies Elizabeth Mullins, CMA; 09/25/2015 11:13 AM) No Known Drug Allergies 09/25/2015  Medication History (Elizabeth Mullins, CMA; 09/25/2015 11:14 AM) Atenolol (50MG  Tablet, Oral) Active. OxyCODONE HCl (5MG  Tablet, Oral) Active. Vigamox (0.5% Solution, Ophthalmic) Active. Raloxifene HCl (60MG  Tablet, Oral) Active. Gabapentin (800MG  Tablet, Oral) Active. MetroNIDAZOLE (500MG  Tablet, Oral) Active. Neomycin-Polymyxin-Dexameth (3.5-10000-0.1 Ointment, Ophthalmic) Active. PrednisoLONE Acetate (1% Suspension, Ophthalmic) Active. Medications Reconciled  Social History Elizabeth Mullins, CMA; 09/25/2015 11:13 AM) Caffeine use Coffee. No alcohol use No drug use Tobacco use Never smoker.  Family History Elizabeth Mullins, CMA; 09/25/2015 11:13 AM) Arthritis Daughter, Mother, Sister. Hypertension Mother. Prostate Cancer Father.  Pregnancy / Birth History Elizabeth Mullins, Elizabeth Mullins; 09/25/2015 11:13 AM) Age at menarche 49 years.     Review of Systems Elizabeth Mullins CMA; 09/25/2015 11:13 AM) General Present- Appetite Loss, Fatigue and Weight Loss. Not Present- Chills, Fever, Night Sweats and Weight Gain. Skin Not Present-  Change in Wart/Mole, Dryness, Hives, Jaundice, New Lesions, Non-Healing Wounds, Rash and Ulcer. HEENT Present- Seasonal Allergies. Not Present- Earache, Hearing Loss, Hoarseness, Nose Bleed, Oral Ulcers, Ringing in  the Ears, Sinus Pain, Sore Throat, Visual Disturbances, Wears glasses/contact lenses and Yellow Eyes. Respiratory Not Present- Bloody sputum, Chronic Cough, Difficulty Breathing, Snoring and Wheezing. Breast Not Present- Breast Mass, Breast Pain, Nipple Discharge and Skin Changes. Cardiovascular Not Present- Chest Pain, Difficulty Breathing Lying Down, Leg Cramps, Palpitations, Rapid Heart Rate, Shortness of Breath and Swelling of Extremities. Gastrointestinal Present- Abdominal Pain. Not Present- Bloating, Bloody Stool, Change in Bowel Habits, Chronic diarrhea, Constipation, Difficulty Swallowing, Excessive gas, Gets full quickly at meals, Hemorrhoids, Indigestion, Nausea, Rectal Pain and Vomiting. Female Genitourinary Present- Frequency. Not Present- Nocturia, Painful Urination, Pelvic Pain and Urgency. Musculoskeletal Present- Back Pain and Joint Pain. Not Present- Joint Stiffness, Muscle Pain, Muscle Weakness and Swelling of Extremities. Neurological Not Present- Decreased Memory, Fainting, Headaches, Numbness, Seizures, Tingling, Tremor, Trouble walking and Weakness. Psychiatric Not Present- Anxiety, Bipolar, Change in Sleep Pattern, Depression, Fearful and Frequent crying. Endocrine Not Present- Cold Intolerance, Excessive Hunger, Hair Changes, Heat Intolerance, Hot flashes and New Diabetes. Hematology Not Present- Easy Bruising, Excessive bleeding, Gland problems, HIV and Persistent Infections.  Vitals (Elizabeth Mullins CMA; 09/25/2015 11:13 AM) 09/25/2015 11:13 AM Weight: 140 lb Height: 59in Body Surface Area: 1.58 m Body Mass Index: 28.28 kg/m  Pulse: 76 (Regular)  BP: 126/76 (Sitting, Left Arm, Standard)      Physical Exam Elizabeth Hector MD; 09/25/2015 1:57  PM)  General Mental Status-Alert. General Appearance-Not in acute distress, Not Sickly. Orientation-Oriented X3. Hydration-Well hydrated. Voice-Normal. Note: Not toxic. Not sickly  Integumentary Global Assessment Upon inspection and palpation of skin surfaces of the - Axillae: non-tender, no inflammation or ulceration, no drainage. and Distribution of scalp and body hair is normal. General Characteristics Temperature - normal warmth is noted.  Head and Neck Head-normocephalic, atraumatic with no lesions or palpable masses. Face Global Assessment - atraumatic, no absence of expression. Neck Global Assessment - no abnormal movements, no bruit auscultated on the right, no bruit auscultated on the left, no decreased range of motion, non-tender. Trachea-midline. Thyroid Gland Characteristics - non-tender.  Eye Eyeball - Left-Extraocular movements intact, No Nystagmus. Eyeball - Right-Extraocular movements intact, No Nystagmus. Cornea - Left-No Hazy. Cornea - Right-No Hazy. Sclera/Conjunctiva - Left-No scleral icterus, No Discharge. Sclera/Conjunctiva - Right-No scleral icterus, No Discharge. Pupil - Left-Direct reaction to light normal. Pupil - Right-Direct reaction to light normal.  ENMT Ears Pinna - Left - no drainage observed, no generalized tenderness observed. Right - no drainage observed, no generalized tenderness observed. Nose and Sinuses External Inspection of the Nose - no destructive lesion observed. Inspection of the nares - Left - quiet respiration. Right - quiet respiration. Mouth and Throat Lips - Upper Lip - no fissures observed, no pallor noted. Lower Lip - no fissures observed, no pallor noted. Nasopharynx - no discharge present. Oral Cavity/Oropharynx - Tongue - no dryness observed. Oral Mucosa - no cyanosis observed. Hypopharynx - no evidence of airway distress observed.  Chest and Lung Exam Inspection Movements - Normal and  Symmetrical. Accessory muscles - No use of accessory muscles in breathing. Palpation Palpation of the chest reveals - Non-tender. Auscultation Breath sounds - Normal and Clear.  Cardiovascular Auscultation Rhythm - Regular. Murmurs & Other Heart Sounds - Auscultation of the heart reveals - No Murmurs and No Systolic Clicks.  Abdomen Inspection Inspection of the abdomen reveals - No Visible peristalsis and No Abnormal pulsations. Umbilicus - No Bleeding, No Urine drainage. Palpation/Percussion Palpation and Percussion of the abdomen reveal - Soft, Non Tender, No Rebound tenderness, No Rigidity (guarding) and  No Cutaneous hyperesthesia. Note: Doughy soft. Mild diastases. Right paramedian vertical incisions consistent with prior appendectomy. Mild discomfort left lower quadrant but no hard mass. No hernias. No peritonitis. No guarding.  Female Genitourinary Sexual Maturity Tanner 5 - Adult hair pattern. Note: No vaginal bleeding nor discharge  Rectal Note: Deferred. Left transgluteal drain with mainly serous fluid..  Peripheral Vascular Upper Extremity Inspection - Left - No Cyanotic nailbeds, Not Ischemic. Right - No Cyanotic nailbeds, Not Ischemic.  Neurologic Neurologic evaluation reveals -normal attention span and ability to concentrate, able to name objects and repeat phrases. Appropriate fund of knowledge , normal sensation and normal coordination. Mental Status Affect - not angry, not paranoid. Cranial Nerves-Normal Bilaterally. Gait-Normal.  Neuropsychiatric Mental status exam performed with findings of-able to articulate well with normal speech/language, rate, volume and coherence, thought content normal with ability to perform basic computations and apply abstract reasoning and no evidence of hallucinations, delusions, obsessions or homicidal/suicidal ideation. Note: Somewhat tearful. Often retells same story/concerns but no true evidence of dementia. I think  she is just worn out and anxious  Musculoskeletal Global Assessment Spine, Ribs and Pelvis - no instability, subluxation or laxity. Right Upper Extremity - no instability, subluxation or laxity.  Lymphatic Head & Neck  General Head & Neck Lymphatics: Bilateral - Description - No Localized lymphadenopathy. Axillary  General Axillary Region: Bilateral - Description - No Localized lymphadenopathy. Femoral & Inguinal  Generalized Femoral & Inguinal Lymphatics: Left - Description - No Localized lymphadenopathy. Right - Description - No Localized lymphadenopathy.    Assessment & Plan Elizabeth Hector MD; 09/25/2015 2:03 PM)  ABSCESS OF SIGMOID COLON DUE TO DIVERTICULITIS (K57.20) Impression: Active elderly woman with repeated attacks of diverticulitis. Abscess development. Percutaneously drained. Improved.  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 Drain left in place. Felt worse.  Repeat CT scan shows a new, second abscess. Walled off without a safe window to drain. Somewhat control with oral antibiotics.  I think she requires segmental rectosigmoid resection. Hopefully the abscess will be walled off enough for resection and anastomosis. May need a temporary colostomy.  I think she should stay on antibiotics until surgery happens. Ciprofloxacin/metronidazole.  Ideally she will get a colonoscopy prior to surgery, but I worry with the abscesses that could worsen at the time of the procedure. She gets colonoscopies every 5 years given the strong family history of polyps and other: Issues. Claims last 14 years ago was normal when she lived in [place]. I asked them to try and give [place] the report just to clarify. Otherwise, hold off and keep it simple. Proceed with surgery.  Like to give 6 weeks from the initial abscess formation before proceeding with surgery. That would make it late May. Ideally would try and do a minimally invasive laparoscopic/robotic approach.  I spent  over an hour talking with the patient and her daughter. They had many questions. I gave him my card. I reintroduced myself more than once. The daughter especially kept interrupting to make sure her questions were answered. Initially the daughter seem guarded but in the end I think they feel more comfortable with proceeding with surgery. Gave him information. Again went over plans many times. Went over my background. Went over the fact our group does foreign colectomy is a year. Patient feels very comfortable with me. Daughter seems more comfortable. Alluded to a son that would have even more questions.  The partient' operative risks are probably increased with the abscess and advanced age, but  she is a very active woman.  Current Plans Started Ciprofloxacin HCl 500MG , 1 (one) Tablet twice a day, #14, 09/25/2015, Ref. x2. Local Order: to control diverticulitis/abscess until you get surgery Started MetroNIDAZOLE 500MG , 1 (one) Tablet twice a day, #14, 09/25/2015, Ref. x2. Local Order: to control diverticulitis/abscess until you have surgery Started Hydrocodone-Acetaminophen 5-325MG , 1-2 Tablet four times daily, as needed, #30, 09/25/2015, No Refill. 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 Continue drain until surgery.  ENCOUNTER FOR PREOPERATIVE EXAMINATION FOR GENERAL SURGICAL PROCEDURE (Z01.818)  Current Plans You are being scheduled for surgery - Our schedulers will call you.  You should hear from our office's scheduling department within 5 working days about the location, date, and time of surgery. We try to make accommodations for patient's preferences in scheduling surgery, but sometimes the OR schedule or the surgeon's schedule prevents [place] from making those accommodations.  If you have not heard from our office [phone]) in 5 working days, call the office and ask for your surgeon's nurse.  If you  have other questions about your diagnosis, plan, or surgery, call the office and ask for your surgeon's nurse.  Written instructions provided Pt Education - [facility] Colon Bowel Prep 2015 Miralax/Antibiotics Started Neomycin Sulfate 500MG , 2 (two) Tablet SEE NOTE, #6, 09/25/2015, No Refill. Local Order: TAKE TWO TABLETS AT 2 PM, 3 PM, AND 10 PM THE DAY PRIOR TO SURGERY Pt Education - [facility] Colectomy post-op instructions: discussed with patient and provided information. Pt Education - Pamphlet Given - Laparoscopic Colorectal Surgery: discussed with patient and provided information. Pt Education - [facility] Good Bowel Health (Brahim Dolman) Pt Education - [facility] Pain Control (Lequita Meadowcroft)

## 2015-09-28 ENCOUNTER — Other Ambulatory Visit: Payer: Self-pay | Admitting: Surgery

## 2015-09-28 ENCOUNTER — Other Ambulatory Visit: Payer: Self-pay | Admitting: Radiology

## 2015-09-28 ENCOUNTER — Ambulatory Visit
Admission: RE | Admit: 2015-09-28 | Discharge: 2015-09-28 | Disposition: A | Discharge status: Home / Self Care | Payer: Medicare Other | Source: Ambulatory Visit | Attending: Surgery | Admitting: Surgery

## 2015-09-28 DIAGNOSIS — L0291 Cutaneous abscess, unspecified: Secondary | ICD-10-CM

## 2015-09-28 DIAGNOSIS — R109 Unspecified abdominal pain: Secondary | ICD-10-CM

## 2015-09-28 MED ORDER — IOPAMIDOL (ISOVUE-300) INJECTION 61%
100.0000 mL | Freq: Once | INTRAVENOUS | Status: AC | PRN
  Administered 2015-09-28: 100 mL via INTRAVENOUS

## 2015-09-28 MED ORDER — IOHEXOL 300 MG/ML  SOLN
30.0000 mL | Freq: Once | INTRAMUSCULAR | Status: AC | PRN
  Administered 2015-09-28: 30 mL via ORAL

## 2015-09-28 MED ORDER — SODIUM CHLORIDE 0.9% FLUSH: 10.0000 mL | Freq: Every day | INTRAVENOUS | Status: DC

## 2015-10-01 ENCOUNTER — Telehealth: Payer: Self-pay | Admitting: Surgery

## 2015-10-01 NOTE — Telephone Encounter (Signed)
I spoke to daughter, Elizabeth Mullins, about mother, Elizabeth Mullins.  Elizabeth Mullins hasdiverticulitis, complicated with a pelvic abscess.  She is on for surgery with Dr. Johney Maine at the end of May.  She has a left buttocks drain.  On irrigation (4 cc) there is fluid coming out around the drain.  I told her this happens as there is less of an abscess cavity.  Elizabeth Mullins also ran a fever of 101.4.  She is on Cipro and Flagyl.  This is a little more concerning.  Since Elizabeth Mullins is already on antibiotics, she till take some tylenol and see how she does.  By reviewing CT's and reports, Elizabeth Mullins had a CT scan on 5/3 which showed a 5.3 cm fluid collection (abscess) above the drain. But when she went for IR drainage on 5/4 - they were unable to place a drain.  Then a repeat CT scan on 5/11 showed this collection to have decreased to 1.7 cm.  They will check with our office tomorrow about further plans per Dr. Johney Maine.  Alphonsa Overall, MD, [facility] Pager: [phone] Office phone:  [phone]

## 2015-10-02 ENCOUNTER — Other Ambulatory Visit: Payer: Self-pay | Admitting: Surgery

## 2015-10-02 DIAGNOSIS — K5720 Diverticulitis of large intestine with perforation and abscess without bleeding: Secondary | ICD-10-CM

## 2015-10-02 DIAGNOSIS — K651 Peritoneal abscess: Secondary | ICD-10-CM

## 2015-10-03 ENCOUNTER — Ambulatory Visit
Admission: RE | Admit: 2015-10-03 | Discharge: 2015-10-03 | Disposition: A | Discharge status: Home / Self Care | Payer: Medicare Other | Source: Ambulatory Visit | Attending: Surgery | Admitting: Surgery

## 2015-10-03 ENCOUNTER — Other Ambulatory Visit: Payer: Self-pay | Admitting: Radiology

## 2015-10-03 DIAGNOSIS — K5720 Diverticulitis of large intestine with perforation and abscess without bleeding: Secondary | ICD-10-CM

## 2015-10-03 DIAGNOSIS — K651 Peritoneal abscess: Secondary | ICD-10-CM

## 2015-10-03 HISTORY — PX: IR GENERIC HISTORICAL: IMG1180011

## 2015-10-03 MED ORDER — IOPAMIDOL (ISOVUE-300) INJECTION 61%
100.0000 mL | Freq: Once | INTRAVENOUS | Status: AC | PRN
  Administered 2015-10-03: 100 mL via INTRAVENOUS

## 2015-10-03 NOTE — Progress Notes (Signed)
Referring Physician(s): Gross,Steven  Supervising Physician: Jacqulynn Cadet  Patient Status: Outpt  Chief Complaint: Diverticular abscess  Subjective: Elizabeth Mullins is back today for another CT scan to recheck her pelvic abscess. She has noticed a lot of drainage coming up around the drain when flushing and a lot of left buttock pain. She had some fever last week, but no longer, she remains on po abx. She is scheduled for surgery later this month with Dr. Johney Maine   Allergies: Codeine; Demerol; Dilaudid; Fentanyl; Morphine and related; and Nsaids  Medications: Prior to Admission medications   Medication Sig Start Date End Date Taking? Authorizing Provider  atenolol (TENORMIN) 50 MG tablet Take 50 mg by mouth daily.    Historical Provider, MD  carisoprodol (SOMA) 350 MG tablet Take 350 mg by mouth 4 (four) times daily as needed for muscle spasms.    Historical Provider, MD  doxazosin (CARDURA) 2 MG tablet Take 2 mg by mouth daily.    Historical Provider, MD  gabapentin (NEURONTIN) 800 MG tablet Take 800 mg by mouth 3 (three) times daily.    Historical Provider, MD  hydrochlorothiazide (HYDRODIURIL) 25 MG tablet Take 25 mg by mouth daily as needed (Feet and Leg swelling).     Historical Provider, MD  hyoscyamine (ANASPAZ) 0.125 MG TBDP disintergrating tablet Place 0.125 mg under the tongue 3 (three) times daily as needed for cramping.     Historical Provider, MD  Omega-3 Fatty Acids (FISH OIL) 1000 MG CAPS Take 1 capsule by mouth daily.    Historical Provider, MD  raloxifene (EVISTA) 60 MG tablet Take 60 mg by mouth daily.    Historical Provider, MD  Red Yeast Rice 600 MG CAPS Take 1 capsule by mouth daily.    Historical Provider, MD  thiamine (VITAMIN B-1) 100 MG tablet Take 100 mg by mouth daily.    Historical Provider, MD  vitamin E (VITAMIN E) 400 UNIT capsule Take 400 Units by mouth daily.    Historical Provider, MD     Vital Signs: BP 113/60 mmHg  Pulse 86  Temp(Src) 98.2  F (36.8 C) (Oral)  SpO2 100%  Physical Exam Left transgluteal drain intact, site clean   Imaging: Ct Abdomen Pelvis W Contrast  10/03/2015  CLINICAL DATA:  82 year old female with a history of diverticular abscess status post percutaneous drain placement on 08/25/2015. Patient has had a persistent fistulous connection with the sigmoid colon as well as recurrent abscess collections or around the percutaneous drainage catheter. EXAM: CT ABDOMEN AND PELVIS WITH CONTRAST TECHNIQUE: Multidetector CT imaging of the abdomen and pelvis was performed using the standard protocol following bolus administration of intravenous contrast. CONTRAST:  152mL ISOVUE-300 IOPAMIDOL (ISOVUE-300) INJECTION 61% COMPARISON:  Numerous prior studies, most recently 09/28/2015 FINDINGS: The left trans gluteal approach percutaneous drainage catheter has pulled back slightly and is no longer within the pelvic fluid collection. A peripherally enhancing fluid and gas collection in the rectovesicular recess has significantly increased in size over the recent interval since the prior study now measuring up to 7.4 x 4.6 cm (best imaged on the sagittal reformatted images) compared to less than 2 cm previously. There is persistent extensive sigmoid diverticulosis. No definite focal inflammatory change to suggest recurrent acute diverticulitis. Additional findings including a minimally complex low-attenuation collection in the pancreatic head measuring up to 3.1 cm remain essentially unchanged. There is no evidence of additional new abscess, fluid collection or other acute abnormality. IMPRESSION: Unfortunately, the left trans gluteal percutaneous drainage catheter has  pulled back out of the deep pelvic abscess since the prior study and there has been a significant interval recurrence of the abscess which now measures up to 7.4 x 4.6 cm compared to less than 2 cm just 5 days ago. No definite new inflammatory change in the adjacent sigmoid  colon to suggest recurrent diverticulitis. This likely represents abscess formation secondary to persistent leak/fistula from the sigmoid colon. Electronically Signed   By: Jacqulynn Cadet M.D.   On: 10/03/2015 11:33    Labs:  CBC:  Recent Labs  08/25/15 0546 08/26/15 0739 08/27/15 0743 09/21/15 0911  WBC 12.3* 9.0 8.0 7.5  HGB 11.1* 10.7* 11.2* 12.5  HCT 32.9* 33.9* 34.4* 38.2  PLT 290 320 345 525*    COAGS:  Recent Labs  08/24/15 1704 09/21/15 0911  INR 1.09 1.05  APTT  --  29    BMP:  Recent Labs  07/16/15 1620 08/24/15 1008 08/25/15 0546 08/26/15 0739  NA 131* 136 134* 138  K 3.9 4.6 3.7 3.5  CL 98* 103 102 102  CO2 27 24 24 26   GLUCOSE 101* 114* 112* 115*  BUN 17 16 14 9   CALCIUM 8.8* 9.5 8.4* 8.5*  CREATININE 0.71 0.70 0.80 0.65  GFRNONAA >60 >60 >60 >60  GFRAA >60 >60 >60 >60    LIVER FUNCTION TESTS:  Recent Labs  07/16/15 1620 08/24/15 1008 08/25/15 0546  BILITOT 0.3 0.4 0.6  AST 25 19 21   ALT 16 16 19   ALKPHOS 45 58 47  PROT 7.0 7.0 5.9*  ALBUMIN 3.9 3.3* 2.8*    Assessment and Plan: Diverticulitis with abscess s/p perc drain Unfortunately, the drain has retracted from the abscess cavity and the abscess has enlarged in the interim. We removed her drain in the clinic today as this was causing significant superficial pain. She will need new CT guided drain placement and we will coordinate that to be done in the next day or so. She needs to continue her abx as she is. We contacted Dr. Johney Maine to make him aware of the CT findings and plan for new drain placement.  Electronically Signed: Ascencion Dike 10/03/2015, 11:58 AM   I spent a total of 25 Minutes at the the patient's bedside AND on the patient's hospital floor or unit, greater than 50% of which was counseling/coordinating care for pelvic diverticular abscess

## 2015-10-04 ENCOUNTER — Ambulatory Visit (HOSPITAL_COMMUNITY)
Admission: RE | Admit: 2015-10-04 | Discharge: 2015-10-04 | Disposition: A | Discharge status: Home / Self Care | Payer: Medicare Other | Source: Ambulatory Visit | Attending: Radiology | Admitting: Radiology

## 2015-10-04 ENCOUNTER — Encounter (HOSPITAL_COMMUNITY): Payer: Self-pay

## 2015-10-04 DIAGNOSIS — K651 Peritoneal abscess: Secondary | ICD-10-CM

## 2015-10-04 DIAGNOSIS — K5720 Diverticulitis of large intestine with perforation and abscess without bleeding: Secondary | ICD-10-CM | POA: Diagnosis present

## 2015-10-04 DIAGNOSIS — Z79899 Other long term (current) drug therapy: Secondary | ICD-10-CM | POA: Insufficient documentation

## 2015-10-04 DIAGNOSIS — Z885 Allergy status to narcotic agent status: Secondary | ICD-10-CM | POA: Diagnosis not present

## 2015-10-04 DIAGNOSIS — I10 Essential (primary) hypertension: Secondary | ICD-10-CM | POA: Insufficient documentation

## 2015-10-04 LAB — CBC WITH DIFFERENTIAL/PLATELET
BASOS PCT: 0 %
Basophils Absolute: 0 10*3/uL (ref 0.0–0.1)
EOS ABS: 0.1 10*3/uL (ref 0.0–0.7)
Eosinophils Relative: 1 %
HCT: 36.6 % (ref 36.0–46.0)
HEMOGLOBIN: 12 g/dL (ref 12.0–15.0)
LYMPHS ABS: 1.4 10*3/uL (ref 0.7–4.0)
Lymphocytes Relative: 16 %
MCH: 29.9 pg (ref 26.0–34.0)
MCHC: 32.8 g/dL (ref 30.0–36.0)
MCV: 91 fL (ref 78.0–100.0)
MONO ABS: 0.7 10*3/uL (ref 0.1–1.0)
MONOS PCT: 8 %
Neutro Abs: 6.5 10*3/uL (ref 1.7–7.7)
Neutrophils Relative %: 75 %
Platelets: 337 10*3/uL (ref 150–400)
RBC: 4.02 MIL/uL (ref 3.87–5.11)
RDW: 14.9 % (ref 11.5–15.5)
WBC: 8.7 10*3/uL (ref 4.0–10.5)

## 2015-10-04 LAB — BASIC METABOLIC PANEL
Anion gap: 12 (ref 5–15)
BUN: 12 mg/dL (ref 6–20)
CALCIUM: 9.2 mg/dL (ref 8.9–10.3)
CHLORIDE: 96 mmol/L — AB (ref 101–111)
CO2: 26 mmol/L (ref 22–32)
CREATININE: 0.65 mg/dL (ref 0.44–1.00)
GFR calc Af Amer: >60 mL/min (ref >60)
GFR calc non Af Amer: >60 mL/min (ref >60)
GLUCOSE: 111 mg/dL — AB (ref 65–99)
Potassium: 4.7 mmol/L (ref 3.5–5.1)
Sodium: 134 mmol/L — ABNORMAL LOW (ref 135–145)

## 2015-10-04 LAB — PROTIME-INR
INR: 1.08 (ref 0.00–1.49)
PROTHROMBIN TIME: 14.2 s (ref 11.6–15.2)

## 2015-10-04 MED ORDER — HYDROCODONE-ACETAMINOPHEN 5-325 MG PO TABS
1.0000 | ORAL_TABLET | ORAL | Status: DC | PRN
  Administered 2015-10-04: 2 via ORAL
  Filled 2015-10-04 (×2): qty 2

## 2015-10-04 MED ORDER — MIDAZOLAM HCL 2 MG/2ML IJ SOLN
INTRAMUSCULAR | Status: AC
  Filled 2015-10-04: qty 2

## 2015-10-04 MED ORDER — FENTANYL CITRATE (PF) 100 MCG/2ML IJ SOLN
INTRAMUSCULAR | Status: AC
  Filled 2015-10-04: qty 2

## 2015-10-04 MED ORDER — FENTANYL CITRATE (PF) 100 MCG/2ML IJ SOLN
INTRAMUSCULAR | Status: AC | PRN
  Administered 2015-10-04 (×4): 25 ug via INTRAVENOUS

## 2015-10-04 MED ORDER — SODIUM CHLORIDE 0.9 % IV SOLN
INTRAVENOUS | Status: DC
  Administered 2015-10-04: 09:00:00 via INTRAVENOUS

## 2015-10-04 MED ORDER — SODIUM CHLORIDE 0.9 % IV SOLN
INTRAVENOUS | Status: AC | PRN
  Administered 2015-10-04: 50 mL/h via INTRAVENOUS

## 2015-10-04 MED ORDER — LIDOCAINE HCL 1 % IJ SOLN
INTRAMUSCULAR | Status: AC
  Filled 2015-10-04: qty 20

## 2015-10-04 MED ORDER — HYDROCODONE-ACETAMINOPHEN 5-325 MG PO TABS
ORAL_TABLET | ORAL | Status: AC
  Filled 2015-10-04: qty 2

## 2015-10-04 MED ORDER — MIDAZOLAM HCL 2 MG/2ML IJ SOLN
INTRAMUSCULAR | Status: AC | PRN
  Administered 2015-10-04 (×2): 1 mg via INTRAVENOUS

## 2015-10-04 NOTE — H&P (Signed)
Chief Complaint: Patient was seen in consultation today for pelvic abscess drain placement at the request of Dr Michael Boston  Referring Physician(s): Dr Michael Boston  Supervising Physician: Arne Cleveland  Patient Status: Out-pt  History of Present Illness: Elizabeth Mullins is a 82 y.o. 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 5/11 CT: only small abscess remains at site of drain Small adjacent abscess of 17 mm noted 5/16 CT IMPRESSION: Unfortunately, the left trans gluteal percutaneous drainage catheter has pulled back out of the deep pelvic abscess since the prior study and there has been a significant interval recurrence of the abscess which now measures up to 7.4 x 4.6 cm compared to less than 2 cm just 5 days ago.  Pt seen in [facility] 5/16 1.) The nonfunctioning drain was cut and removed for patient comfort 2.) We will schedule for CT drain placement to drain the reaccumulated abscess collection. There appears to be a window both anterolaterally as well as through the left transgluteal approach.  Now scheduled for new drain placement  Past Medical History  Diagnosis Date  . Diverticulitis   . Hypertension   . Scoliosis   . Allergic headache   . Arthritis     "all over"  . Chronic back pain     "top of my neck to bottom of my feet" (08/25/2015)    Past Surgical History  Procedure Laterality Date  . Appendectomy  1960s  . Tonsillectomy    . Total knee  arthroplasty Right   . Ct guide abscess drainage  ([facility] hx)  08/25/2015    pelvic diverticular abscess  . Joint replacement    . Vaginal hysterectomy  1973    "partial"  . Dilation and curettage of uterus      Allergies: Codeine; Demerol; Morphine and related; Nsaids; and Oxycodone  Medications: Prior to Admission medications   Medication Sig Start Date End Date Taking? Authorizing Provider  atenolol (TENORMIN) 50 MG tablet Take 50 mg by mouth daily.   Yes Historical Provider, MD  carisoprodol (SOMA) 350 MG tablet Take 350 mg by mouth 4 (four) times daily as needed for muscle spasms.   Yes Historical Provider, MD  doxazosin (CARDURA) 2 MG tablet Take 2 mg by mouth daily.   Yes Historical Provider, MD  gabapentin (NEURONTIN) 800 MG tablet Take 800 mg by mouth 3 (three) times daily.   Yes Historical Provider, MD  hyoscyamine (ANASPAZ) 0.125 MG TBDP disintergrating tablet Place 0.125 mg under the tongue 3 (three) times daily as needed for cramping.    Yes Historical Provider, MD  Omega-3 Fatty Acids (FISH OIL) 1000 MG CAPS Take 1 capsule by mouth daily.   Yes Historical Provider, MD  raloxifene (EVISTA) 60 MG tablet Take 60 mg by mouth daily.   Yes Historical Provider, MD  Red Yeast Rice 600 MG CAPS Take 1 capsule by mouth daily.   Yes Historical Provider, MD  thiamine (VITAMIN B-1) 100 MG tablet Take 100 mg by mouth daily.   Yes Historical Provider, MD  vitamin E (VITAMIN E) 400 UNIT capsule Take 400 Units by mouth daily.   Yes Historical Provider, MD  hydrochlorothiazide (HYDRODIURIL) 25 MG tablet Take 25 mg by mouth daily as needed (Feet and Leg swelling).     Historical Provider, MD     History reviewed. No pertinent family history.  Social History   Social History  . Marital Status: Widowed    Spouse Name: N/A  . Number of Children: N/A  . Years of Education: N/A   Social History Main Topics  . Smoking status: Never Smoker   . Smokeless tobacco: Never Used  . Alcohol Use: No    . Drug Use: No  . Sexual Activity: No   Other Topics Concern  . None   Social History Narrative     Review of Systems: A 12 point ROS discussed and pertinent positives are indicated in the HPI above.  All other systems are negative.  Review of Systems  Constitutional: Positive for activity change, appetite change and fatigue. Negative for fever.  Gastrointestinal: Positive for nausea, abdominal pain and rectal pain.  Neurological: Positive for weakness.  Psychiatric/Behavioral: Negative for behavioral problems and confusion.    Vital Signs: BP 123/70 mmHg  Pulse 73  Temp(Src) 98.7 F (37.1 C) (Oral)  Resp 16  Ht 4\' 11"  (1.499 m)  Wt 140 lb (63.504 kg)  BMI 28.26 kg/m2  SpO2 98%  Physical Exam  Constitutional: She is oriented to person, place, and time.  Cardiovascular: Normal rate, regular rhythm and normal heart sounds.   No murmur heard. Pulmonary/Chest: Effort normal and breath sounds normal. She has no wheezes.  Abdominal: Soft. Bowel sounds are normal. There is tenderness.  Musculoskeletal: Normal range of motion.  Neurological: She is alert and oriented to person, place, and time.  Skin: Skin is warm and dry.  Psychiatric: She has a normal mood and affect. Her behavior is normal. Judgment and thought content normal.  Nursing note and vitals reviewed.   Mallampati Score:  MD Evaluation Airway: WNL Heart: WNL Abdomen: WNL Chest/ Lungs: WNL ASA  Classification: 2 Mallampati/Airway Score: Two  Imaging: Ct Abdomen Pelvis W Contrast  10/03/2015  CLINICAL DATA:  82 year old female with a history of diverticular abscess status post percutaneous drain placement on 08/25/2015. Patient has had a persistent fistulous connection with the sigmoid colon as well as recurrent abscess collections or around the percutaneous drainage catheter. EXAM: CT ABDOMEN AND PELVIS WITH CONTRAST TECHNIQUE: Multidetector CT imaging of the abdomen and pelvis was performed using the  standard protocol following bolus administration of intravenous contrast. CONTRAST:  176mL ISOVUE-300 IOPAMIDOL (ISOVUE-300) INJECTION 61% COMPARISON:  Numerous prior studies, most recently 09/28/2015 FINDINGS: The left trans gluteal approach percutaneous drainage catheter has pulled back slightly and is no longer within the pelvic fluid collection. A peripherally enhancing fluid and gas collection in the rectovesicular recess has significantly increased in size over the recent interval since the prior study now measuring up to 7.4 x 4.6 cm (best imaged on the sagittal reformatted images) compared to less than 2 cm previously. There is persistent extensive sigmoid diverticulosis. No definite focal inflammatory change to suggest recurrent acute diverticulitis. Additional findings including a minimally complex low-attenuation collection in the pancreatic head measuring up to 3.1 cm remain essentially unchanged. There is no evidence of additional new abscess, fluid collection or other acute abnormality. IMPRESSION: Unfortunately, the left trans gluteal percutaneous drainage catheter has pulled back out of the deep pelvic abscess since the prior study and there has  been a significant interval recurrence of the abscess which now measures up to 7.4 x 4.6 cm compared to less than 2 cm just 5 days ago. No definite new inflammatory change in the adjacent sigmoid colon to suggest recurrent diverticulitis. This likely represents abscess formation secondary to persistent leak/fistula from the sigmoid colon. Electronically Signed   By: Jacqulynn Cadet M.D.   On: 10/03/2015 11:33   Ct Abdomen Pelvis W Contrast  09/28/2015  CLINICAL DATA:  Left lower quadrant pain and nausea in patient with known diverticulitis and abscess with drain in place EXAM: CT ABDOMEN AND PELVIS WITH CONTRAST TECHNIQUE: Multidetector CT imaging of the abdomen and pelvis was performed using the standard protocol following bolus administration of  intravenous contrast. CONTRAST:  135mL ISOVUE-300 IOPAMIDOL (ISOVUE-300) INJECTION 61%, 85mL OMNIPAQUE IOHEXOL 300 MG/ML SOLN COMPARISON:  CT pelvis of 09/21/2015 and CT abdomen pelvis of 09/20/2015 FINDINGS: The lung bases remain clear. The liver enhances and a cyst is again noted in the left lobe of liver. No other focal hepatic abnormality is seen. The gallbladder is slightly distended but no calcified gallstones are seen and the gallbladder wall does not appear to be thickened. The low-attenuation structure within the body of the pancreas is not as well seen on the current study but again further assessment with MR would be recommended. No dilatation of the pancreatic duct is evident. The adrenal glands and spleen are unremarkable. The stomach is moderately fluid distended with no abnormality noted. The kidneys enhance with no calculus or mass. Mild parenchymal loss is noted along the inferior posterolateral left kidney which is stable. On delayed images the pelvocaliceal systems are unremarkable and the proximal ureters are normal in caliber. The abdominal aorta is normal in caliber. The trans gluteal drainage catheter is unchanged in position with the tip lying just above the vaginal cuff to the left of midline. The abscess cavity contains a small amount of air but it has definitely decreased in size. There is an adjacent smaller low-attenuation collection measuring 17 mm on image 54 which may represent a tiny adjacent abscess. There does appear to have been some interval improvement in the edema of the sigmoid colon at the site of diverticulitis with decrease in pericolonic strandiness. Multiple colonic diverticula are again are noted diffusely throughout the colon. The terminal ileum is unremarkable. Urinary bladder is decompressed and cannot be well evaluated. Fat enters the left inguinal canal. IMPRESSION: 1. Only a small abscess cavity remains at the site of abscess drainage catheter with a small amount  air within it. 2. Probable small adjacent abscess of 17 mm in diameter in the midline. 3. Some improvement in overall appearance of diverticulitis of the sigmoid colon. 4. The low-attenuation structure within the body of the pancreas is not as well seen but does persist and MRI of the pancreas is recommended. Electronically Signed   By: Ivar Drape M.D.   On: 09/28/2015 14:30   Ct Abdomen Pelvis W Contrast  09/20/2015  CLINICAL DATA:  Follow-up diverticular abscess. EXAM: CT ABDOMEN AND PELVIS WITH CONTRAST TECHNIQUE: Multidetector CT imaging of the abdomen and pelvis was performed using the standard protocol following bolus administration of intravenous contrast. CONTRAST:  165mL ISOVUE-300 IOPAMIDOL (ISOVUE-300) INJECTION 61% COMPARISON:  09/06/2015 FINDINGS: Lower chest: Few pleural-based densities along the right lower lobe appear to be new and probably related to atelectasis. No large pleural effusions. Hepatobiliary: Again noted is a lobulated low-density structure in left hepatic lobe roughly measuring 1.4 cm and most likely  represents a cyst. Otherwise, normal appearance of the liver without biliary dilatation. Normal appearance of the gallbladder without inflammatory changes. Portal venous system is patent. Pancreas: There continues to be a low-density lesion at the pancreatic head measuring 2.1 x 1.5 cm. No significant biliary dilatation. No inflammation around the pancreas. Spleen: Stable appearance of spleen without enlargement. Adrenals/Urinary Tract: Normal adrenal glands. Again noted is a low-density structure along the left kidney upper pole which probably represents a cyst this may be hyperdense or complex. No suspicious renal findings. No hydronephrosis. Normal appearance of the urinary bladder. Stomach/Bowel: Left transgluteal drainage catheter in the pelvis. Enlarging fluid collection extending cephalad from the pigtail drainage catheter. This fluid collection appears to be mildly loculated or  complex. This collection measures 5.3 x 3.5 x 3.8 cm. There is a new small low-density fluid collection located right of the drain, measuring 1.5 cm, which may be connected to the larger more cephalad collection. Small amount of gas within this new fluid cavity and findings are compatible with a new or enlarging abscess collection. Again noted is extensive diverticulosis in the sigmoid colon and there continues to be inflammation along the posterior aspect the sigmoid colon which has minimally improved. No evidence for bowel dilatation or obstruction. Wall thickening at the gastric cardia or GE junction which is similar to prior examinations. This finding may be related to a small hiatal hernia but indeterminate. Vascular/Lymphatic: Atherosclerotic disease in the abdominal aorta without aneurysm. No significant lymphadenopathy abdomen or pelvis. Reproductive: Uterus has been surgically removed. Other: Left inguinal hernia containing fat. Musculoskeletal: Scoliosis in lumbar spine with multilevel degenerative disease. IMPRESSION: New complex fluid collection(s) around the pelvic drainage catheter and adjacent to the sigmoid colon. Findings are suggestive for recurrent or new diverticular abscesses. Decreasing but persistent inflammatory changes around the sigmoid colon compatible with diverticulitis. Persistent 2.1 cm low-density structure at the pancreatic head which could represent a cystic lesion. Recommend further characterization with an abdominal MRI. Persistent wall thickening at the gastroesophageal junction. Findings may be associated with a hiatal hernia but cannot exclude inflammation or wall thickening in this area. Electronically Signed   By: Markus Daft M.D.   On: 09/20/2015 13:29   Ct Abdomen Pelvis W Contrast  09/06/2015  CLINICAL DATA:  Diverticular abscess, post percutaneous drain catheter placement 08/25/2015. Less than 10 cc output per day. No flushing. On bulb. EXAM: CT ABDOMEN AND PELVIS WITH  CONTRAST TECHNIQUE: Multidetector CT imaging of the abdomen and pelvis was performed using the standard protocol following bolus administration of intravenous contrast. CONTRAST:  127mL ISOVUE-300 IOPAMIDOL (ISOVUE-300) INJECTION 61% COMPARISON:  08/25/2015 and previous FINDINGS: Lower chest:  No acute findings. Hepatobiliary: No masses . A few scattered calcified granulomas. Stable segment 3 hepatic cyst. Gallbladder physiologically distended. Focal dilatation of the distal CBD versus adjacent cystic lesion in the pancreatic head, as previously described. Pancreas: No mass, inflammatory changes, or other significant abnormality. Spleen: Within normal limits in size and appearance. Adrenals/Urinary Tract: No masses identified. No evidence of hydronephrosis. Stable left upper pole renal cyst. Urinary bladder incompletely distended. Stomach/Bowel: Stomach and small bowel are decompressed. Appendix not identified. Moderate fecal material in the proximal colon. Scattered descending and sigmoid diverticula. There has been some increase in inflammatory change adjacent to the proximal and mid sigmoid colon, and there is a poorly marginated 2.5 cm fluid collection at the medial aspect of the inflammation, posterior to the mid sigmoid colon. The percutaneous drain catheter has evacuated the previously noted abscess between the  mid sigmoid colon and urinary bladder. There are Gas bubbles in the deep subcutaneous tissues of the right gluteal region around the drain catheter. Vascular/Lymphatic: No pathologically enlarged lymph nodes. Scattered calcified aortic plaque. No evidence of abdominal aortic aneurysm. Reproductive: Previous hysterectomy. Other: No ascites.  No free air. Musculoskeletal: Thoracolumbar scoliosis with multilevel degenerative changes in the visualized lower thoracic and lumbar spine. IMPRESSION: 1. Interval resolution of abscess post drain catheter placement. 2. Some increase in inflammatory/edematous  changes adjacent to the proximal and mid sigmoid colon, with the an adjacent poorly marginated 2.5 cm fluid attenuation collection medially. Electronically Signed   By: Lucrezia Europe M.D.   On: 09/06/2015 11:33   Ir Sinus/fist Tube Chk-non Gi  09/21/2015  INDICATION: 82 year old female with diverticular abscesses. Recent CT examination demonstrated new abscess collections adjacent to the decompressed abscess that contains a pigtail catheter. Plan for a drain injection and possible repositioning of a drainage catheter within the new abscess collections. EXAM: SINUS TRACT INJECTION / FISTULOGRAM; DRAIN EXCHANGE WITH FLUOROSCOPY COMPARISON:  None. MEDICATIONS: The patient is currently admitted to the hospital and receiving intravenous antibiotics. The antibiotics were administered within an appropriate time frame prior to the initiation of the procedure. ANESTHESIA/SEDATION: Fentanyl 50 mcg IV; Versed 1.0 mg IV Moderate Sedation Time:  13 minutes The patient was continuously monitored during the procedure by the interventional radiology nurse under my direct supervision. COMPLICATIONS: None immediate. TECHNIQUE: Informed written consent was obtained from the patient after a thorough discussion of the procedural risks, benefits and alternatives. All questions were addressed. Maximal Sterile Barrier Technique was utilized including caps, mask, sterile gowns, sterile gloves, sterile drape, hand hygiene and skin antiseptic. A timeout was performed prior to the initiation of the procedure. PROCEDURE: Patient was placed prone. The existing drain and surrounding skin were prepped and draped in a sterile fashion. Contrast was injected through the drainage catheter. This drain was cut and removed over a Bentson wire. A 40 cm, 5 French catheter was advanced into the cavity and was positioned in a small sinus tract. Additional injections were performed. This catheter was removed over a Bentson wire and a new 10.2 [place] drain was  reconstituted within the small abscess cavity. Skin was anesthetized with 1% lidocaine and sutured to the skin. A new gravity bag was placed. Bandage placed over the drain. FINDINGS: Small residual abscess cavity in the pelvis. There is a small sinus tract extending cephalad. Catheter was placed near the sinus tract and contrast injection confirmed a fistula tract to the colon. Due to the persistent fistula tract, a new 10 French drain was placed. There was no connection to the new abscess collection. IMPRESSION: The abscess cavity containing the drain continues to have a fistula connection to the colon. Therefore, the drain was exchanged and attached to gravity bag. There was no connection between the drain collection and the new abscess collection. Therefore, the patient will be transferred to CT for attempted CT-guided drain placement. Electronically Signed   By: Markus Daft M.D.   On: 09/21/2015 18:06   Ir Catheter Tube Change  09/21/2015  INDICATION: 82 year old female with diverticular abscesses. Recent CT examination demonstrated new abscess collections adjacent to the decompressed abscess that contains a pigtail catheter. Plan for a drain injection and possible repositioning of a drainage catheter within the new abscess collections. EXAM: SINUS TRACT INJECTION / FISTULOGRAM; DRAIN EXCHANGE WITH FLUOROSCOPY COMPARISON:  None. MEDICATIONS: The patient is currently admitted to the hospital and receiving intravenous antibiotics. The antibiotics were  administered within an appropriate time frame prior to the initiation of the procedure. ANESTHESIA/SEDATION: Fentanyl 50 mcg IV; Versed 1.0 mg IV Moderate Sedation Time:  13 minutes The patient was continuously monitored during the procedure by the interventional radiology nurse under my direct supervision. COMPLICATIONS: None immediate. TECHNIQUE: Informed written consent was obtained from the patient after a thorough discussion of the procedural risks, benefits  and alternatives. All questions were addressed. Maximal Sterile Barrier Technique was utilized including caps, mask, sterile gowns, sterile gloves, sterile drape, hand hygiene and skin antiseptic. A timeout was performed prior to the initiation of the procedure. PROCEDURE: Patient was placed prone. The existing drain and surrounding skin were prepped and draped in a sterile fashion. Contrast was injected through the drainage catheter. This drain was cut and removed over a Bentson wire. A 40 cm, 5 French catheter was advanced into the cavity and was positioned in a small sinus tract. Additional injections were performed. This catheter was removed over a Bentson wire and a new 10.2 [place] drain was reconstituted within the small abscess cavity. Skin was anesthetized with 1% lidocaine and sutured to the skin. A new gravity bag was placed. Bandage placed over the drain. FINDINGS: Small residual abscess cavity in the pelvis. There is a small sinus tract extending cephalad. Catheter was placed near the sinus tract and contrast injection confirmed a fistula tract to the colon. Due to the persistent fistula tract, a new 10 French drain was placed. There was no connection to the new abscess collection. IMPRESSION: The abscess cavity containing the drain continues to have a fistula connection to the colon. Therefore, the drain was exchanged and attached to gravity bag. There was no connection between the drain collection and the new abscess collection. Therefore, the patient will be transferred to CT for attempted CT-guided drain placement. Electronically Signed   By: Markus Daft M.D.   On: 09/21/2015 18:06   Dg Sinus/fist Tube Chk-non Gi  09/06/2015  CLINICAL DATA:  Diverticulitis of large intestine with abscess without bleeding/intra abdominal abscess. Percutaneous drain catheter placed in diverticular abscess 08/25/2015. Minimal output. EXAM: ABSCESS DRAIN CATHETER INJECTION UNDER FLUOROSCOPY TECHNIQUE: The procedure,  risks (including but not limited to bleeding, infection, organ damage ), benefits, and alternatives were explained to the patient. Questions regarding the procedure were encouraged and answered. The patient understands and consents to the procedure. Survey fluoroscopic inspection reveals stable position of the pigtail drain catheter in the left pelvis. Injection initially demonstrated occlusion of the drain catheter. This was cleared with gentle aspiration of sterile saline using a 3 mL syringe. Contrast injection under fluoroscopy demonstrated no significant residual abscess cavity. However, there was opacification of a patent fistula to the mid sigmoid colon. IMPRESSION: 1. 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 Electronically Signed   By: Lucrezia Europe M.D.   On: 09/06/2015 11:57   Ct Image Guided Drainage By Percutaneous Catheter  09/21/2015  INDICATION: 82 year old with diverticular abscesses. The patient already has a pigtail catheter in one pelvic abscess but she has developed a new pelvic fluid collection which is concerning for an abscess. Recent drain injection and drain exchange did not show a connection between the existing drain cavity and a new abscess collection. Therefore, we will attempt a new drain placement with CT guidance. EXAM: ATTEMPTED CT GUIDED DRAINAGE OF PELVIC ABSCESS MEDICATIONS: None ANESTHESIA/SEDATION: 2 mg IV Versed 100 mcg IV Fentanyl Moderate Sedation Time:  25 minutes The patient was continuously monitored during  the procedure by the interventional radiology nurse under my direct supervision. COMPLICATIONS: None immediate. TECHNIQUE: Informed written consent was obtained from the patient after a thorough discussion of the procedural risks, benefits and alternatives. All questions were addressed. Maximal Sterile Barrier Technique was utilized including caps, mask, sterile gowns, sterile gloves, sterile drape, hand hygiene and skin  antiseptic. A timeout was performed prior to the initiation of the procedure. PROCEDURE: Patient was placed prone on the CT scanner. Images through the pelvis were obtained. The left gluteal area was prepped and draped in sterile fashion. Skin was anesthetized with 1% lidocaine. An 18 gauge needle was directed towards the fluid collection in the upper left hemipelvic region. There was a very small percutaneous window in order to safely place a drain. Needle was directed lateral to the left side of the sacrum and was positioned along the caudal aspect of the collection. Needle was directed into this area but no fluid could be aspirated. Attempted to direct the needle more cephalad but this was not successful and there was concern about adjacent bowel in this area. Therefore, the procedure was stopped. Needle was removed and bandage placed the puncture site. FINDINGS: Pigtail drainage catheter in the left hemipelvis from a transgluteal approach. Cephalad to this drain, there is a low-density collection thought to represent a new abscess. An 18 gauge needle was directed towards the collection from a transgluteal approach. Needle was placed near the caudal margin of this collection but no fluid could be aspirated. The needle could not be safely redirected more cephalad into the collection. Therefore, the procedure was stopped. IMPRESSION: Attempted placement of a drainage catheter within the pelvic fluid collection/abscess with CT guidance. This procedure was not successful due to the location of the fluid collection and adjacent bowel structures. Electronically Signed   By: Markus Daft M.D.   On: 09/21/2015 18:16    Labs:  CBC:  Recent Labs  08/26/15 0739 08/27/15 0743 09/21/15 0911 10/04/15 0815  WBC 9.0 8.0 7.5 8.7  HGB 10.7* 11.2* 12.5 12.0  HCT 33.9* 34.4* 38.2 36.6  PLT 320 345 525* 337    COAGS:  Recent Labs  08/24/15 1704 09/21/15 0911 10/04/15 0815  INR 1.09 1.05 1.08  APTT  --  29  --      BMP:  Recent Labs  07/16/15 1620 08/24/15 1008 08/25/15 0546 08/26/15 0739  NA 131* 136 134* 138  K 3.9 4.6 3.7 3.5  CL 98* 103 102 102  CO2 27 24 24 26   GLUCOSE 101* 114* 112* 115*  BUN 17 16 14 9   CALCIUM 8.8* 9.5 8.4* 8.5*  CREATININE 0.71 0.70 0.80 0.65  GFRNONAA >60 >60 >60 >60  GFRAA >60 >60 >60 >60    LIVER FUNCTION TESTS:  Recent Labs  07/16/15 1620 08/24/15 1008 08/25/15 0546  BILITOT 0.3 0.4 0.6  AST 25 19 21   ALT 16 16 19   ALKPHOS 45 58 47  PROT 7.0 7.0 5.9*  ALBUMIN 3.9 3.3* 2.8*    TUMOR MARKERS: No results for input(s): AFPTM, CEA, CA199, CHROMGRNA in the last 8760 hours.  Assessment and Plan:  Diverticular abscess Placed originally 08/25/15 + fistula 09/06/15 Drain manipulation 09/21/15 Drain malposition noted 5/16 CT with findings of larger abscess/accumulation Now for new drain placement into pelvic abscess Risks and Benefits discussed with the patient including bleeding, infection, damage to adjacent structures, bowel perforation/fistula connection, and sepsis. All of the patient's questions were answered, patient is agreeable to proceed. Consent signed and in  chart.   Thank you for this interesting consult.  I greatly enjoyed meeting Buelah Bartolini and look forward to participating in their care.  A copy of this report was sent to the requesting provider on this date.  Electronically Signed: Kel Senn A 10/04/2015, 8:49 AM   I spent a total of  30 Minutes   in face to face in clinical consultation, greater than 50% of which was counseling/coordinating care for pelvic abscess drain placement

## 2015-10-04 NOTE — Discharge Instructions (Signed)
Percutaneous Abscess Drain, Care After °Refer to this sheet in the next few weeks. These instructions provide you with information on caring for yourself after your procedure. Your health care provider may also give you more specific instructions. Your treatment has been planned according to current medical practices, but problems sometimes occur. Call your health care provider if you have any problems or questions after your procedure. °WHAT TO EXPECT AFTER THE PROCEDURE °After your procedure, it is typical to have the following:  °· A small amount of discomfort in the area where the drainage tube was placed. °· A small amount of bruising around the area where the drainage tube was placed. °· Sleepiness and fatigue for the rest of the day from the medicines used. °HOME CARE INSTRUCTIONS °· Rest at home for 1-2 days following your procedure or as directed by your health care provider. °· If you go home right after the procedure, plan to have someone with you for 24 hours. °· Do not take a bath or shower for 24 hours after your procedure. °· Take medicines only as directed by your health care provider. Ask your health care provider when you can resume taking any normal medicines. °· Change bandages (dressings) as directed.   °· You may be told to record the amount of drainage from the bag every time you empty it. Follow your health care provider's directions for emptying the bag. Write down the amount of drainage, the date, and the time you emptied it. °· Call your health care provider when the drain is putting out less than 10 mL of drainage per day for 2-3 days in a row or as directed by your health care provider. °· Follow your health care provider's instructions for cleaning the drainage tube. You may need to clean the tube every day so that it does not clog. °SEEK MEDICAL CARE IF: °· You have increased bleeding (more than a small spot) from the site where the drainage tube was placed. °· You have redness,  swelling, or increasing pain around the site where the drainage tube was placed. °· You notice a discharge or bad smell coming from the site where the drainage tube was placed. °· You have a fever or chills.  °· You have pain that is not helped by medicine.   °SEEK IMMEDIATE MEDICAL CARE IF: °· There is leakage around the drainage tube. °· The drainage tube pulls out. °· You suddenly stop having drainage from the tube. °· You suddenly have blood in the drainage fluid. °· You become dizzy or faint. °· You develop a rash.   °· You have nausea or vomiting. °· You have difficulty breathing, feel short of breath, or feel faint.   °· You develop chest pain. °· You have problems with your speech or vision. °· You have trouble balancing or moving your arms or legs. °  °This information is not intended to replace advice given to you by your health care provider. Make sure you discuss any questions you have with your health care provider. °  °Document Released: 09/20/2013 Document Revised: 02/22/2014 Document Reviewed: 09/20/2013 °Elsevier Interactive Patient Education ©2016 [employer] ° °

## 2015-10-04 NOTE — Sedation Documentation (Signed)
Daughter updated per phone

## 2015-10-04 NOTE — Procedures (Signed)
CT L pelvic abscess drain placement 76ml purulent output to lab for GS, C&S No complication No blood loss. See complete dictation in [facility].

## 2015-10-04 NOTE — Sedation Documentation (Signed)
C/o pain meds as ordered with relief

## 2015-10-07 LAB — CULTURE, ROUTINE-ABSCESS

## 2015-10-12 ENCOUNTER — Encounter (HOSPITAL_COMMUNITY): Payer: Self-pay

## 2015-10-12 ENCOUNTER — Encounter (HOSPITAL_COMMUNITY)
Admission: RE | Admit: 2015-10-12 | Discharge: 2015-10-12 | Disposition: A | Discharge status: Home / Self Care | Payer: Medicare Other | Source: Ambulatory Visit | Attending: Surgery | Admitting: Surgery

## 2015-10-12 DIAGNOSIS — I10 Essential (primary) hypertension: Secondary | ICD-10-CM | POA: Insufficient documentation

## 2015-10-12 DIAGNOSIS — Z01812 Encounter for preprocedural laboratory examination: Secondary | ICD-10-CM | POA: Insufficient documentation

## 2015-10-12 DIAGNOSIS — F419 Anxiety disorder, unspecified: Secondary | ICD-10-CM | POA: Diagnosis present

## 2015-10-12 DIAGNOSIS — Z01810 Encounter for preprocedural cardiovascular examination: Secondary | ICD-10-CM | POA: Insufficient documentation

## 2015-10-12 DIAGNOSIS — K5792 Diverticulitis of intestine, part unspecified, without perforation or abscess without bleeding: Secondary | ICD-10-CM | POA: Insufficient documentation

## 2015-10-12 HISTORY — DX: Other reduced mobility: Z74.09

## 2015-10-12 HISTORY — DX: Other specified postprocedural states: Z98.890

## 2015-10-12 LAB — ABO/RH: ABO/RH(D): O POS

## 2015-10-12 NOTE — Consult Note (Signed)
WOC ostomy consult note Patient seen for assessment of abdomen and stoma site selection at the request of Dr. Johney Maine.  He requests marking for both a temporary loop ileostomy and a diverting colostomy.  Patient is accompanied to this visit by her daughter.  After explanation of why a marking is necessary, patient is cooperative and interested in ostomy pouches, stoma characteristics and the matter at hand, marking. Patient is short-waisted and has fine wrinkles and creases in the lower abdomen. She cannot visualize a marking above the umbilicus.  The RLQ has a minor defect where she had an appendectomy approximately 40 years ago. Surgical date is Wednesday, May 31. Abdomen is marked after cleansing with HCG wipe x 2 and allowing to dry in between cleansings.  A surgical skin marking pen is used and the marks are covered with a thin film transparent dressing.  The ileostomy site is marked  in the RLQ:  123456 below the umbilicus and [id number] to the right The colostomy site is marked in the LLQ 7cm below the umbilicus and [id number] to the left  Patient requests to take home the Colostomy education booklet and a sample pouching system.   [facility] nursing team will remain available to this patient, the nursing, surgical and medical teams.  Please re-consult if a stoma is created intraoperatively. Thanks, Maudie Flakes, MSN, RN, [place], Arther Abbott  Pager# [phone]

## 2015-10-12 NOTE — Pre-Procedure Instructions (Addendum)
EKG done today. Labs- 10-04-15 CBC/d,BMP, PT/INR -Epic. CT abd/pelvis 10-03-15 Epic. 10-17-15 0835 Dr. Veatrice Kells -reviewed EKG "may proceed".

## 2015-10-12 NOTE — Patient Instructions (Signed)
Elizabeth Mullins  10/12/2015   Your procedure is scheduled on:10-18-15    Report to [facility] Main  Entrance take [facility]  elevators to 3rd floor to  [place] at   0930 AM.  Call this number if you have problems the morning of surgery [phone]   Remember: ONLY 1 PERSON MAY GO WITH YOU TO SHORT STAY TO GET  READY MORNING OF [place].  Do not eat food or drink liquids :After Midnight.(drink clear liquids plentiful day before)    Follow Bowel prep instructions per MD.  Take these medicines the morning of surgery with A SIP OF WATER: Atenolol. Cadura. Gabapentin. DO NOT TAKE ANY DIABETIC MEDICATIONS DAY OF YOUR SURGERY                               You may not have any metal on your body including hair pins and              piercings  Do not wear jewelry, make-up, lotions, powders or perfumes, deodorant             Do not wear nail polish.  Do not shave  48 hours prior to surgery.              Men may shave face and neck.   Do not bring valuables to the hospital. [place].  Contacts, dentures or bridgework may not be worn into surgery.  Leave suitcase in the car. After surgery it may be brought to your room.     Patients discharged the day of surgery will not be allowed to drive home.  Name and phone number of your driver: Markiya Mana [phone] cell  Special Instructions: N/A              Please read over the following fact sheets you were given: _____________________________________________________________________             [facility] - Preparing for Surgery Before surgery, you can play an important role.  Because skin is not sterile, your skin needs to be as free of germs as possible.  You can reduce the number of germs on your skin by washing with CHG (chlorahexidine gluconate) soap before surgery.  CHG is an antiseptic cleaner which kills germs and bonds with the skin to continue  killing germs even after washing. Please DO NOT use if you have an allergy to CHG or antibacterial soaps.  If your skin becomes reddened/irritated stop using the CHG and inform your nurse when you arrive at Short Stay. Do not shave (including legs and underarms) for at least 48 hours prior to the first CHG shower.  You may shave your face/neck. Please follow these instructions carefully:  1.  Shower with CHG Soap the night before surgery and the  morning of Surgery.  2.  If you choose to wash your hair, wash your hair first as usual with your  normal  shampoo.  3.  After you shampoo, rinse your hair and body thoroughly to remove the  shampoo.                           4.  Use CHG as you  would any other liquid soap.  You can apply chg directly  to the skin and wash                       Gently with a scrungie or clean washcloth.  5.  Apply the CHG Soap to your body ONLY FROM THE NECK DOWN.   Do not use on face/ open                           Wound or open sores. Avoid contact with eyes, ears mouth and genitals (private parts).                       Wash face,  Genitals (private parts) with your normal soap.             6.  Wash thoroughly, paying special attention to the area where your surgery  will be performed.  7.  Thoroughly rinse your body with warm water from the neck down.  8.  DO NOT shower/wash with your normal soap after using and rinsing off  the CHG Soap.                9.  Pat yourself dry with a clean towel.            10.  Wear clean pajamas.            11.  Place clean sheets on your bed the night of your first shower and do not  sleep with pets. Day of Surgery : Do not apply any lotions/deodorants the morning of surgery.  Please wear clean clothes to the hospital/surgery center.  FAILURE TO FOLLOW THESE INSTRUCTIONS MAY RESULT IN THE CANCELLATION OF YOUR SURGERY PATIENT SIGNATURE_________________________________  NURSE  SIGNATURE__________________________________  ________________________________________________________________________

## 2015-10-13 LAB — HEMOGLOBIN A1C
HEMOGLOBIN A1C: 6.2 % — AB (ref 4.8–5.6)
Mean Plasma Glucose: 131 mg/dL

## 2015-10-16 ENCOUNTER — Telehealth: Payer: Self-pay | Admitting: Surgery

## 2015-10-17 NOTE — Telephone Encounter (Signed)
alled by patient's daughter with concerns that leaking around drain when irrigation done.  Patient otherwise feeling fine.  No abdominal pain or fevers this time.  Hopefully this is a sign that the cavity is closed down and not that the drain is pulled out.  Due for surgery in a few days.  Questions answered.  Patient's daughter expressed understanding and appreciation.  Should the patient felt worse, they will call back.  May require readmission.  Hopefully can cool this down and do 1 stage colectomy.

## 2015-10-18 ENCOUNTER — Inpatient Hospital Stay (HOSPITAL_COMMUNITY): Payer: Medicare Other | Admitting: Anesthesiology

## 2015-10-18 ENCOUNTER — Encounter (HOSPITAL_COMMUNITY)
Admission: RE | Disposition: A | Discharge status: Home Health | Payer: Self-pay | Source: Ambulatory Visit | Attending: Surgery

## 2015-10-18 ENCOUNTER — Encounter (HOSPITAL_COMMUNITY): Payer: Self-pay | Admitting: *Deleted

## 2015-10-18 ENCOUNTER — Inpatient Hospital Stay (HOSPITAL_COMMUNITY)
Admission: RE | Admit: 2015-10-18 | Discharge: 2015-10-23 | DRG: 330 | Disposition: A | Discharge status: Home Health | Payer: Medicare Other | Source: Ambulatory Visit | Attending: Surgery | Admitting: Surgery

## 2015-10-18 DIAGNOSIS — M549 Dorsalgia, unspecified: Secondary | ICD-10-CM | POA: Diagnosis present

## 2015-10-18 DIAGNOSIS — M542 Cervicalgia: Secondary | ICD-10-CM

## 2015-10-18 DIAGNOSIS — H918X3 Other specified hearing loss, bilateral: Secondary | ICD-10-CM | POA: Diagnosis present

## 2015-10-18 DIAGNOSIS — Z90710 Acquired absence of both cervix and uterus: Secondary | ICD-10-CM | POA: Diagnosis not present

## 2015-10-18 DIAGNOSIS — Z8371 Family history of colonic polyps: Secondary | ICD-10-CM | POA: Diagnosis not present

## 2015-10-18 DIAGNOSIS — Z8249 Family history of ischemic heart disease and other diseases of the circulatory system: Secondary | ICD-10-CM | POA: Diagnosis not present

## 2015-10-18 DIAGNOSIS — I10 Essential (primary) hypertension: Secondary | ICD-10-CM | POA: Diagnosis present

## 2015-10-18 DIAGNOSIS — K651 Peritoneal abscess: Secondary | ICD-10-CM | POA: Diagnosis present

## 2015-10-18 DIAGNOSIS — F419 Anxiety disorder, unspecified: Secondary | ICD-10-CM | POA: Diagnosis present

## 2015-10-18 DIAGNOSIS — Z96651 Presence of right artificial knee joint: Secondary | ICD-10-CM | POA: Diagnosis present

## 2015-10-18 DIAGNOSIS — M1280 Other specific arthropathies, not elsewhere classified, unspecified site: Secondary | ICD-10-CM | POA: Diagnosis present

## 2015-10-18 DIAGNOSIS — M159 Polyosteoarthritis, unspecified: Secondary | ICD-10-CM | POA: Diagnosis present

## 2015-10-18 DIAGNOSIS — K5720 Diverticulitis of large intestine with perforation and abscess without bleeding: Secondary | ICD-10-CM | POA: Diagnosis present

## 2015-10-18 DIAGNOSIS — R109 Unspecified abdominal pain: Secondary | ICD-10-CM | POA: Diagnosis present

## 2015-10-18 DIAGNOSIS — K589 Irritable bowel syndrome without diarrhea: Secondary | ICD-10-CM | POA: Diagnosis present

## 2015-10-18 DIAGNOSIS — R269 Unspecified abnormalities of gait and mobility: Secondary | ICD-10-CM | POA: Diagnosis present

## 2015-10-18 DIAGNOSIS — Z79899 Other long term (current) drug therapy: Secondary | ICD-10-CM | POA: Diagnosis not present

## 2015-10-18 DIAGNOSIS — F411 Generalized anxiety disorder: Secondary | ICD-10-CM

## 2015-10-18 DIAGNOSIS — M419 Scoliosis, unspecified: Secondary | ICD-10-CM | POA: Diagnosis present

## 2015-10-18 DIAGNOSIS — H903 Sensorineural hearing loss, bilateral: Secondary | ICD-10-CM | POA: Diagnosis present

## 2015-10-18 DIAGNOSIS — Z79891 Long term (current) use of opiate analgesic: Secondary | ICD-10-CM

## 2015-10-18 DIAGNOSIS — K632 Fistula of intestine: Secondary | ICD-10-CM | POA: Diagnosis present

## 2015-10-18 DIAGNOSIS — G8929 Other chronic pain: Secondary | ICD-10-CM | POA: Diagnosis present

## 2015-10-18 DIAGNOSIS — K5790 Diverticulosis of intestine, part unspecified, without perforation or abscess without bleeding: Secondary | ICD-10-CM | POA: Diagnosis present

## 2015-10-18 DIAGNOSIS — K660 Peritoneal adhesions (postprocedural) (postinfection): Secondary | ICD-10-CM | POA: Diagnosis present

## 2015-10-18 DIAGNOSIS — Z7952 Long term (current) use of systemic steroids: Secondary | ICD-10-CM

## 2015-10-18 DIAGNOSIS — M1990 Unspecified osteoarthritis, unspecified site: Secondary | ICD-10-CM | POA: Diagnosis present

## 2015-10-18 HISTORY — PX: COLON SURGERY: SHX602

## 2015-10-18 LAB — TYPE AND SCREEN
ABO/RH(D): O POS
Antibody Screen: NEGATIVE

## 2015-10-18 SURGERY — COLECTOMY, PARTIAL, ROBOT-ASSISTED, LAPAROSCOPIC
Anesthesia: General

## 2015-10-18 MED ORDER — CEFOTETAN DISODIUM 2 G IJ SOLR
2.0000 g | Freq: Two times a day (BID) | INTRAMUSCULAR | Status: AC
  Administered 2015-10-19: 2 g via INTRAVENOUS
  Filled 2015-10-18: qty 2

## 2015-10-18 MED ORDER — RALOXIFENE HCL 60 MG PO TABS
60.0000 mg | ORAL_TABLET | Freq: Every day | ORAL | Status: DC
  Administered 2015-10-19 – 2015-10-23 (×5): 60 mg via ORAL
  Filled 2015-10-18 (×6): qty 1

## 2015-10-18 MED ORDER — SACCHAROMYCES BOULARDII 250 MG PO CAPS
250.0000 mg | ORAL_CAPSULE | Freq: Two times a day (BID) | ORAL | Status: DC
  Administered 2015-10-18 – 2015-10-23 (×10): 250 mg via ORAL
  Filled 2015-10-18 (×12): qty 1

## 2015-10-18 MED ORDER — HYOSCYAMINE SULFATE 0.125 MG PO TBDP
0.1250 mg | ORAL_TABLET | Freq: Three times a day (TID) | ORAL | Status: DC | PRN
  Filled 2015-10-18: qty 1

## 2015-10-18 MED ORDER — PROCHLORPERAZINE EDISYLATE 5 MG/ML IJ SOLN
10.0000 mg | Freq: Four times a day (QID) | INTRAMUSCULAR | Status: DC | PRN
  Administered 2015-10-19: 10 mg via INTRAVENOUS
  Filled 2015-10-18: qty 2

## 2015-10-18 MED ORDER — FENTANYL CITRATE (PF) 250 MCG/5ML IJ SOLN
INTRAMUSCULAR | Status: AC
Start: 2015-10-18 — End: 2015-10-18
  Filled 2015-10-18: qty 5

## 2015-10-18 MED ORDER — ENOXAPARIN SODIUM 40 MG/0.4ML ~~LOC~~ SOLN
40.0000 mg | SUBCUTANEOUS | Status: DC
  Administered 2015-10-19 – 2015-10-23 (×5): 40 mg via SUBCUTANEOUS
  Filled 2015-10-18 (×8): qty 0.4

## 2015-10-18 MED ORDER — METOPROLOL TARTRATE 5 MG/5ML IV SOLN: 5.0000 mg | Freq: Four times a day (QID) | INTRAVENOUS | Status: DC | PRN

## 2015-10-18 MED ORDER — METHOCARBAMOL 1000 MG/10ML IJ SOLN
1000.0000 mg | Freq: Four times a day (QID) | INTRAMUSCULAR | Status: DC | PRN
  Filled 2015-10-18: qty 10

## 2015-10-18 MED ORDER — LACTATED RINGERS IV SOLN
INTRAVENOUS | Status: DC
  Administered 2015-10-19 (×3): via INTRAVENOUS

## 2015-10-18 MED ORDER — VITAMIN E 180 MG (400 UNIT) PO CAPS
400.0000 [IU] | ORAL_CAPSULE | Freq: Every day | ORAL | Status: DC
  Administered 2015-10-19 – 2015-10-23 (×5): 400 [IU] via ORAL
  Filled 2015-10-18 (×5): qty 1

## 2015-10-18 MED ORDER — DOXAZOSIN MESYLATE 2 MG PO TABS
2.0000 mg | ORAL_TABLET | Freq: Every day | ORAL | Status: DC
  Administered 2015-10-19: 2 mg via ORAL
  Filled 2015-10-18 (×3): qty 1

## 2015-10-18 MED ORDER — ATENOLOL 50 MG PO TABS
50.0000 mg | ORAL_TABLET | Freq: Every day | ORAL | Status: DC
  Administered 2015-10-19: 50 mg via ORAL
  Filled 2015-10-18 (×3): qty 1

## 2015-10-18 MED ORDER — ALVIMOPAN 12 MG PO CAPS
12.0000 mg | ORAL_CAPSULE | Freq: Once | ORAL | Status: AC
  Administered 2015-10-18: 12 mg via ORAL
  Filled 2015-10-18: qty 1

## 2015-10-18 MED ORDER — HYDROMORPHONE HCL 1 MG/ML IJ SOLN: 0.5000 mg | INTRAMUSCULAR | Status: DC | PRN

## 2015-10-18 MED ORDER — ONDANSETRON HCL 4 MG/2ML IJ SOLN
INTRAMUSCULAR | Status: AC
  Filled 2015-10-18: qty 2

## 2015-10-18 MED ORDER — SODIUM CHLORIDE 0.9 % IV SOLN
INTRAVENOUS | Status: DC
  Filled 2015-10-18: qty 6

## 2015-10-18 MED ORDER — DEXTROSE 5 % IV SOLN
2.0000 g | INTRAVENOUS | Status: AC
  Administered 2015-10-18: 2 g via INTRAVENOUS
  Filled 2015-10-18: qty 2

## 2015-10-18 MED ORDER — ENOXAPARIN SODIUM 40 MG/0.4ML ~~LOC~~ SOLN
40.0000 mg | Freq: Once | SUBCUTANEOUS | Status: AC
  Administered 2015-10-18: 40 mg via SUBCUTANEOUS
  Filled 2015-10-18: qty 0.4

## 2015-10-18 MED ORDER — GUAIFENESIN-DM 100-10 MG/5ML PO SYRP: 15.0000 mL | ORAL_SOLUTION | ORAL | Status: DC | PRN

## 2015-10-18 MED ORDER — PHENOL 1.4 % MT LIQD
2.0000 | OROMUCOSAL | Status: DC | PRN
Start: 2015-10-18 — End: 2015-10-23

## 2015-10-18 MED ORDER — ONDANSETRON HCL 4 MG/2ML IJ SOLN: 4.0000 mg | Freq: Once | INTRAMUSCULAR | Status: DC | PRN

## 2015-10-18 MED ORDER — DIPHENHYDRAMINE HCL 12.5 MG/5ML PO ELIX: 12.5000 mg | ORAL_SOLUTION | Freq: Four times a day (QID) | ORAL | Status: DC | PRN

## 2015-10-18 MED ORDER — GABAPENTIN 800 MG PO TABS
800.0000 mg | ORAL_TABLET | Freq: Three times a day (TID) | ORAL | Status: DC
  Filled 2015-10-18: qty 1

## 2015-10-18 MED ORDER — FENTANYL CITRATE (PF) 100 MCG/2ML IJ SOLN
INTRAMUSCULAR | Status: DC | PRN
  Administered 2015-10-18 (×4): 50 ug via INTRAVENOUS

## 2015-10-18 MED ORDER — LIDOCAINE HCL (CARDIAC) 20 MG/ML IV SOLN
INTRAVENOUS | Status: DC | PRN
  Administered 2015-10-18: 40 mg via INTRAVENOUS

## 2015-10-18 MED ORDER — HYDROCODONE-ACETAMINOPHEN 5-325 MG PO TABS: 1.0000 | ORAL_TABLET | Freq: Four times a day (QID) | ORAL | Status: DC | PRN

## 2015-10-18 MED ORDER — FENTANYL CITRATE (PF) 100 MCG/2ML IJ SOLN
25.0000 ug | INTRAMUSCULAR | Status: DC | PRN
  Administered 2015-10-18 – 2015-10-19 (×3): 50 ug via INTRAVENOUS
  Filled 2015-10-18 (×3): qty 2

## 2015-10-18 MED ORDER — ONDANSETRON HCL 4 MG/2ML IJ SOLN
INTRAMUSCULAR | Status: DC | PRN
  Administered 2015-10-18: 4 mg via INTRAVENOUS

## 2015-10-18 MED ORDER — FENTANYL CITRATE (PF) 100 MCG/2ML IJ SOLN
25.0000 ug | INTRAMUSCULAR | Status: DC | PRN
  Administered 2015-10-18: 50 ug via INTRAVENOUS

## 2015-10-18 MED ORDER — SUGAMMADEX SODIUM 200 MG/2ML IV SOLN
INTRAVENOUS | Status: DC | PRN
  Administered 2015-10-18: 130 mg via INTRAVENOUS

## 2015-10-18 MED ORDER — PROPOFOL 10 MG/ML IV BOLUS
INTRAVENOUS | Status: DC | PRN
  Administered 2015-10-18: 100 mg via INTRAVENOUS

## 2015-10-18 MED ORDER — FENTANYL CITRATE (PF) 100 MCG/2ML IJ SOLN
INTRAMUSCULAR | Status: AC
  Administered 2015-10-18: 50 ug via INTRAVENOUS
  Filled 2015-10-18: qty 2

## 2015-10-18 MED ORDER — LACTATED RINGERS IR SOLN
Status: DC | PRN
  Administered 2015-10-18: 1000 mL

## 2015-10-18 MED ORDER — HYDROMORPHONE HCL 1 MG/ML IJ SOLN
0.5000 mg | INTRAMUSCULAR | Status: DC | PRN
  Administered 2015-10-19: 1 mg via INTRAVENOUS
  Filled 2015-10-18: qty 1
  Filled 2015-10-18: qty 2

## 2015-10-18 MED ORDER — BUPIVACAINE-EPINEPHRINE 0.25% -1:200000 IJ SOLN
INTRAMUSCULAR | Status: AC
  Filled 2015-10-18: qty 1

## 2015-10-18 MED ORDER — ONDANSETRON 4 MG PO TBDP
4.0000 mg | ORAL_TABLET | Freq: Three times a day (TID) | ORAL | Status: DC | PRN
  Administered 2015-10-18 – 2015-10-22 (×3): 4 mg via ORAL
  Filled 2015-10-18 (×3): qty 1

## 2015-10-18 MED ORDER — EPHEDRINE SULFATE 50 MG/ML IJ SOLN
INTRAMUSCULAR | Status: DC | PRN
  Administered 2015-10-18: 10 mg via INTRAVENOUS
  Administered 2015-10-18 (×3): 5 mg via INTRAVENOUS

## 2015-10-18 MED ORDER — PHENYLEPHRINE HCL 10 MG/ML IJ SOLN
10.0000 mg | INTRAMUSCULAR | Status: DC | PRN
  Administered 2015-10-18: 50 ug/min via INTRAVENOUS

## 2015-10-18 MED ORDER — ALVIMOPAN 12 MG PO CAPS
12.0000 mg | ORAL_CAPSULE | Freq: Two times a day (BID) | ORAL | Status: DC
  Administered 2015-10-19 – 2015-10-22 (×7): 12 mg via ORAL
  Filled 2015-10-18 (×8): qty 1

## 2015-10-18 MED ORDER — MAGIC MOUTHWASH
15.0000 mL | Freq: Four times a day (QID) | ORAL | Status: DC | PRN
  Filled 2015-10-18: qty 15

## 2015-10-18 MED ORDER — MENTHOL 3 MG MT LOZG: 1.0000 | LOZENGE | OROMUCOSAL | Status: DC | PRN

## 2015-10-18 MED ORDER — GABAPENTIN 400 MG PO CAPS
800.0000 mg | ORAL_CAPSULE | Freq: Three times a day (TID) | ORAL | Status: DC
  Administered 2015-10-18 – 2015-10-23 (×14): 800 mg via ORAL
  Filled 2015-10-18 (×16): qty 2

## 2015-10-18 MED ORDER — LIDOCAINE HCL (CARDIAC) 20 MG/ML IV SOLN
INTRAVENOUS | Status: AC
  Filled 2015-10-18: qty 5

## 2015-10-18 MED ORDER — ALPRAZOLAM 0.5 MG PO TABS
0.5000 mg | ORAL_TABLET | Freq: Three times a day (TID) | ORAL | Status: DC | PRN
  Administered 2015-10-20 – 2015-10-23 (×2): 0.5 mg via ORAL
  Filled 2015-10-18 (×2): qty 1

## 2015-10-18 MED ORDER — 0.9 % SODIUM CHLORIDE (POUR BTL) OPTIME
TOPICAL | Status: DC | PRN
  Administered 2015-10-18: 2000 mL

## 2015-10-18 MED ORDER — FENTANYL CITRATE (PF) 100 MCG/2ML IJ SOLN
25.0000 ug | INTRAMUSCULAR | Status: DC | PRN
  Administered 2015-10-18 (×4): 25 ug via INTRAVENOUS

## 2015-10-18 MED ORDER — DIPHENHYDRAMINE HCL 50 MG/ML IJ SOLN: 12.5000 mg | Freq: Four times a day (QID) | INTRAMUSCULAR | Status: DC | PRN

## 2015-10-18 MED ORDER — FENTANYL CITRATE (PF) 100 MCG/2ML IJ SOLN
INTRAMUSCULAR | Status: AC
  Administered 2015-10-18: 25 ug via INTRAVENOUS
  Filled 2015-10-18: qty 2

## 2015-10-18 MED ORDER — ZOLPIDEM TARTRATE 5 MG PO TABS
5.0000 mg | ORAL_TABLET | Freq: Every evening | ORAL | Status: DC | PRN
  Administered 2015-10-18: 5 mg via ORAL
  Filled 2015-10-18 (×2): qty 1

## 2015-10-18 MED ORDER — BUPIVACAINE LIPOSOME 1.3 % IJ SUSP
20.0000 mL | INTRAMUSCULAR | Status: DC
  Filled 2015-10-18: qty 20

## 2015-10-18 MED ORDER — ROCURONIUM BROMIDE 100 MG/10ML IV SOLN
INTRAVENOUS | Status: DC | PRN
  Administered 2015-10-18: 10 mg via INTRAVENOUS
  Administered 2015-10-18: 20 mg via INTRAVENOUS
  Administered 2015-10-18: 40 mg via INTRAVENOUS
  Administered 2015-10-18: 10 mg via INTRAVENOUS

## 2015-10-18 MED ORDER — PROPOFOL 10 MG/ML IV BOLUS
INTRAVENOUS | Status: AC
  Filled 2015-10-18: qty 20

## 2015-10-18 MED ORDER — LACTATED RINGERS IV SOLN
INTRAVENOUS | Status: DC
  Administered 2015-10-18 (×4): via INTRAVENOUS

## 2015-10-18 MED ORDER — CEFOTETAN DISODIUM-DEXTROSE 2-2.08 GM-% IV SOLR
INTRAVENOUS | Status: AC
  Filled 2015-10-18: qty 50

## 2015-10-18 MED ORDER — LIP MEDEX EX OINT
1.0000 | TOPICAL_OINTMENT | Freq: Two times a day (BID) | CUTANEOUS | Status: DC
Start: 2015-10-18 — End: 2015-10-23
  Administered 2015-10-18 – 2015-10-23 (×9): 1 via TOPICAL
  Filled 2015-10-18 (×2): qty 7

## 2015-10-18 MED ORDER — ROCURONIUM BROMIDE 50 MG/5ML IV SOLN
INTRAVENOUS | Status: AC
Start: 2015-10-18 — End: 2015-10-18
  Filled 2015-10-18: qty 1

## 2015-10-18 MED ORDER — CARISOPRODOL 350 MG PO TABS: 350.0000 mg | ORAL_TABLET | Freq: Four times a day (QID) | ORAL | Status: DC | PRN

## 2015-10-18 MED ORDER — VITAMIN B-1 100 MG PO TABS
100.0000 mg | ORAL_TABLET | Freq: Every day | ORAL | Status: DC
  Administered 2015-10-19 – 2015-10-23 (×5): 100 mg via ORAL
  Filled 2015-10-18 (×5): qty 1

## 2015-10-18 MED ORDER — TRAMADOL HCL 50 MG PO TABS: 50.0000 mg | ORAL_TABLET | Freq: Four times a day (QID) | ORAL | Status: DC | PRN

## 2015-10-18 MED ORDER — PHENYLEPHRINE HCL 10 MG/ML IJ SOLN
INTRAMUSCULAR | Status: DC | PRN
  Administered 2015-10-18: 100 ug via INTRAVENOUS

## 2015-10-18 MED ORDER — ACETAMINOPHEN 500 MG PO TABS
1000.0000 mg | ORAL_TABLET | Freq: Three times a day (TID) | ORAL | Status: DC
  Administered 2015-10-19 (×3): 1000 mg via ORAL
  Filled 2015-10-18 (×8): qty 2

## 2015-10-18 MED ORDER — ALUM & MAG HYDROXIDE-SIMETH 200-200-20 MG/5ML PO SUSP
30.0000 mL | Freq: Four times a day (QID) | ORAL | Status: DC | PRN
  Filled 2015-10-18: qty 30

## 2015-10-18 MED ORDER — LACTATED RINGERS IV BOLUS (SEPSIS): 1000.0000 mL | Freq: Three times a day (TID) | INTRAVENOUS | Status: DC | PRN

## 2015-10-18 MED ORDER — PHENYLEPHRINE 40 MCG/ML (10ML) SYRINGE FOR IV PUSH (FOR BLOOD PRESSURE SUPPORT)
PREFILLED_SYRINGE | INTRAVENOUS | Status: AC
  Filled 2015-10-18: qty 10

## 2015-10-18 SURGICAL SUPPLY — 107 items
APPLIER CLIP 5 13 M/L LIGAMAX5 (MISCELLANEOUS)
APPLIER CLIP ROT 10 11.4 M/L (STAPLE)
BLADE EXTENDED COATED 6.5IN (ELECTRODE) IMPLANT
BLADE SURG SZ11 CARB STEEL (BLADE) ×3 IMPLANT
CABLE HIGH FREQUENCY MONO STRZ (ELECTRODE) IMPLANT
CANNULA REDUC XI 12-8 STAPL (CANNULA) ×1
CANNULA REDUC XI 12-8MM STAPL (CANNULA) ×1
CANNULA REDUCER 12-8 DVNC XI (CANNULA) ×1 IMPLANT
CELLS DAT CNTRL 66122 CELL SVR (MISCELLANEOUS) IMPLANT
CHLORAPREP W/TINT 26ML (MISCELLANEOUS) IMPLANT
CLIP APPLIE 5 13 M/L LIGAMAX5 (MISCELLANEOUS) IMPLANT
CLIP APPLIE ROT 10 11.4 M/L (STAPLE) IMPLANT
CLIP LIGATING HEM O LOK PURPLE (MISCELLANEOUS) IMPLANT
CLIP LIGATING HEMO O LOK GREEN (MISCELLANEOUS) IMPLANT
COUNTER NEEDLE 20 DBL MAG RED (NEEDLE) ×3 IMPLANT
COVER MAYO STAND STRL (DRAPES) ×6 IMPLANT
COVER SURGICAL LIGHT HANDLE (MISCELLANEOUS) ×3 IMPLANT
COVER TIP SHEARS 8 DVNC (MISCELLANEOUS) ×1 IMPLANT
COVER TIP SHEARS 8MM DA VINCI (MISCELLANEOUS) ×2
DECANTER SPIKE VIAL GLASS SM (MISCELLANEOUS) ×6 IMPLANT
DEVICE TROCAR PUNCTURE CLOSURE (ENDOMECHANICALS) IMPLANT
DRAIN CHANNEL 19F RND (DRAIN) ×3 IMPLANT
DRAPE ARM DVNC X/XI (DISPOSABLE) ×5 IMPLANT
DRAPE COLUMN DVNC XI (DISPOSABLE) ×1 IMPLANT
DRAPE DA VINCI XI ARM (DISPOSABLE) ×10
DRAPE DA VINCI XI COLUMN (DISPOSABLE) ×2
DRAPE LG THREE QUARTER DISP (DRAPES) ×3 IMPLANT
DRAPE SURG IRRIG POUCH 19X23 (DRAPES) ×3 IMPLANT
DRAPE WARM FLUID 44X44 (DRAPE) IMPLANT
DRSG OPSITE POSTOP 4X10 (GAUZE/BANDAGES/DRESSINGS) IMPLANT
DRSG OPSITE POSTOP 4X6 (GAUZE/BANDAGES/DRESSINGS) ×3 IMPLANT
DRSG OPSITE POSTOP 4X8 (GAUZE/BANDAGES/DRESSINGS) IMPLANT
DRSG TEGADERM 2-3/8X2-3/4 SM (GAUZE/BANDAGES/DRESSINGS) ×12 IMPLANT
DRSG TEGADERM 4X4.75 (GAUZE/BANDAGES/DRESSINGS) IMPLANT
ELECT PENCIL ROCKER SW 15FT (MISCELLANEOUS) ×6 IMPLANT
ELECT REM PT RETURN 9FT ADLT (ELECTROSURGICAL) ×3
ELECTRODE REM PT RTRN 9FT ADLT (ELECTROSURGICAL) ×1 IMPLANT
ENDOLOOP SUT PDS II  0 18 (SUTURE)
ENDOLOOP SUT PDS II 0 18 (SUTURE) IMPLANT
EVACUATOR SILICONE 100CC (DRAIN) ×3 IMPLANT
GAUZE SPONGE 2X2 8PLY STRL LF (GAUZE/BANDAGES/DRESSINGS) ×1 IMPLANT
GAUZE SPONGE 4X4 12PLY STRL (GAUZE/BANDAGES/DRESSINGS) IMPLANT
GLOVE ECLIPSE 8.0 STRL XLNG CF (GLOVE) ×9 IMPLANT
GLOVE INDICATOR 8.0 STRL GRN (GLOVE) ×9 IMPLANT
GOWN STRL REUS W/TWL XL LVL3 (GOWN DISPOSABLE) ×12 IMPLANT
KIT PROCEDURE DA VINCI SI (MISCELLANEOUS) ×2
KIT PROCEDURE DVNC SI (MISCELLANEOUS) ×1 IMPLANT
LEGGING LITHOTOMY PAIR STRL (DRAPES) ×3 IMPLANT
LUBRICANT JELLY K Y 4OZ (MISCELLANEOUS) IMPLANT
MARKER SKIN DUAL TIP RULER LAB (MISCELLANEOUS) ×3 IMPLANT
NEEDLE INSUFFLATION 14GA 120MM (NEEDLE) ×3 IMPLANT
PACK CARDIOVASCULAR III (CUSTOM PROCEDURE TRAY) ×3 IMPLANT
PACK COLON (CUSTOM PROCEDURE TRAY) ×3 IMPLANT
PORT LAP GEL ALEXIS MED 5-9CM (MISCELLANEOUS) IMPLANT
RTRCTR WOUND ALEXIS 18CM MED (MISCELLANEOUS)
SCISSORS LAP 5X35 DISP (ENDOMECHANICALS) IMPLANT
SCRUB PCMX 4 OZ (MISCELLANEOUS) ×3 IMPLANT
SEAL CANN UNIV 5-8 DVNC XI (MISCELLANEOUS) ×3 IMPLANT
SEAL XI 5MM-8MM UNIVERSAL (MISCELLANEOUS) ×6
SEALER VESSEL DA VINCI XI (MISCELLANEOUS)
SEALER VESSEL EXT DVNC XI (MISCELLANEOUS) IMPLANT
SET BI-LUMEN FLTR TB AIRSEAL (TUBING) ×3 IMPLANT
SET TUBE IRRIG SUCTION NO TIP (IRRIGATION / IRRIGATOR) ×3 IMPLANT
SOLUTION ELECTROLUBE (MISCELLANEOUS) ×3 IMPLANT
SPONGE GAUZE 2X2 STER 10/PKG (GAUZE/BANDAGES/DRESSINGS) ×2
STAPLER 45 BLU RELOAD XI (STAPLE) ×1 IMPLANT
STAPLER 45 BLUE RELOAD XI (STAPLE) ×2
STAPLER 45 GREEN RELOAD XI (STAPLE)
STAPLER 45 GRN RELOAD XI (STAPLE) IMPLANT
STAPLER CANNULA SEAL DVNC XI (STAPLE) ×1 IMPLANT
STAPLER CANNULA SEAL XI (STAPLE) ×2
STAPLER CIRC ILS CVD 33MM 37CM (STAPLE) ×3 IMPLANT
STAPLER SHEATH (SHEATH) ×2
STAPLER SHEATH ENDOWRIST DVNC (SHEATH) ×1 IMPLANT
SUT MNCRL AB 4-0 PS2 18 (SUTURE) ×3 IMPLANT
SUT PDS AB 1 CTX 36 (SUTURE) ×6 IMPLANT
SUT PDS AB 1 TP1 96 (SUTURE) IMPLANT
SUT PDS AB 2-0 CT2 27 (SUTURE) IMPLANT
SUT PROLENE 0 CT 2 (SUTURE) ×3 IMPLANT
SUT PROLENE 2 0 BLUE (SUTURE) ×3 IMPLANT
SUT PROLENE 2 0 SH DA (SUTURE) IMPLANT
SUT SILK 2 0 (SUTURE) ×2
SUT SILK 2 0 SH CR/8 (SUTURE) ×3 IMPLANT
SUT SILK 2-0 18XBRD TIE 12 (SUTURE) ×1 IMPLANT
SUT SILK 3 0 (SUTURE) ×2
SUT SILK 3 0 SH CR/8 (SUTURE) ×3 IMPLANT
SUT SILK 3-0 18XBRD TIE 12 (SUTURE) ×1 IMPLANT
SUT V-LOC BARB 180 2/0GR6 GS22 (SUTURE)
SUT VIC AB 3-0 SH 18 (SUTURE) IMPLANT
SUT VIC AB 3-0 SH 27 (SUTURE)
SUT VIC AB 3-0 SH 27XBRD (SUTURE) IMPLANT
SUT VICRYL 0 UR6 27IN ABS (SUTURE) ×3 IMPLANT
SUT VLOC 180 2-0 9IN GS21 (SUTURE) IMPLANT
SUTURE V-LC BRB 180 2/0GR6GS22 (SUTURE) IMPLANT
SYRINGE 10CC LL (SYRINGE) ×3 IMPLANT
SYS LAPSCP GELPORT 120MM (MISCELLANEOUS)
SYSTEM LAPSCP GELPORT 120MM (MISCELLANEOUS) IMPLANT
TAPE UMBILICAL COTTON 1/8X30 (MISCELLANEOUS) ×3 IMPLANT
TOWEL OR 17X26 10 PK STRL BLUE (TOWEL DISPOSABLE) ×3 IMPLANT
TOWEL OR NON WOVEN STRL DISP B (DISPOSABLE) IMPLANT
TRAY FOLEY W/METER SILVER 14FR (SET/KITS/TRAYS/PACK) ×3 IMPLANT
TRAY FOLEY W/METER SILVER 16FR (SET/KITS/TRAYS/PACK) ×3 IMPLANT
TROCAR ADV FIXATION 5X100MM (TROCAR) IMPLANT
TROCAR Z-THREAD FIOS 5X100MM (TROCAR) IMPLANT
TUBING CONNECTING 10 (TUBING) IMPLANT
TUBING CONNECTING 10' (TUBING)
TUNNELER SHEATH ON-Q 16GX12 DP (PAIN MANAGEMENT) IMPLANT

## 2015-10-18 NOTE — Op Note (Addendum)
10/18/2015  5:42 PM  PATIENT:  Elizabeth Mullins  82 y.o. female  Patient Care Team: Loraine Leriche, MD as PCP - General (Internal Medicine) Michael Boston, MD as Consulting Physician (General Surgery)  PRE-OPERATIVE DIAGNOSIS:  Persistent sigmoid diverticulitis with recurrent abscesses  POST-OPERATIVE DIAGNOSIS:  Persistent sigmoid diverticulitis with recurrent abscesses   PROCEDURE:    XI ROBOT ASSISTED LOW ANTERIOR RECTOSIGMOID RESECTION Robotic lysis of adhesions x 45min Robotic mobilization of the splenic flexure of the colon RIGID PROCTOSCOPY  SURGEON:  Michael Boston, MD  ASSISTANT:  Leighton Ruff, MD  ANESTHESIA:   local and general  EBL:  Total I/O In: 3000 [I.V.:3000] Out: 600 [Urine:300; Blood:300]  Delay start of Pharmacological VTE agent (>24hrs) due to surgical blood loss or risk of bleeding:  no  DRAINS: (19Fr) Blake drain(s) in the PELVIS   SPECIMEN:  Source of Specimen:    1.  Rectosigmoid colon with severe diverticulitis.  Open end is proximal.   2.  Anastomotic rings.  Blue stitch is proximal anastomotic ring.  DISPOSITION OF SPECIMEN:  PATHOLOGY  COUNTS:  YES  PLAN OF CARE: Admit to inpatient   PATIENT DISPOSITION:  PACU - hemodynamically stable.  INDICATION:    Pleasant elderly woman struggling with diverticulitis with recurrent episodes of pain.  Developed abscess formation.  Percutaneously drained.  Recommendation made for surgery.  Drain partially dislodged.  New abscess.  A drain.  Better.  I recommended surgical resection.  Noted possibility need of ostomy.  In discussion with the patient and daughter.  Patient anxious but consolable.  The anatomy & physiology of the digestive tract was discussed.  The pathophysiology was discussed.  Natural history risks without surgery was discussed.   I worked to give an overview of the disease and the frequent need to have multispecialty involvement.  I feel the risks of no intervention will lead to  serious problems that outweigh the operative risks; therefore, I recommended a partial colectomy to remove the pathology.  Laparoscopic & open techniques were discussed.   Risks such as bleeding, infection, abscess, leak, reoperation, possible ostomy, hernia, heart attack, death, and other risks were discussed.  I noted a good likelihood this will help address the problem.   Goals of post-operative recovery were discussed as well.  We will work to minimize complications.  Educational materials on the pathology had been given in the office.  Questions were answered.    The patient & daughter expressed understanding & wished to proceed with surgery.  OR FINDINGS:   Patient had very torturous inflamed corkscrew sigmoid colon.  Very dense adhesion to left anterior pelvic rim.  Fallopian tubes on both sides as well as uterus and ovaries and densely adherent to the liver.  Most likely location of walled off abscess.  Very foreshortened left colon mesentery requiring splenic flexure mobilization.  No obvious metastatic disease on visceral parietal peritoneum or liver.  The anastomosis rests 12 cm from the anal verge by rigid proctoscopy.  DESCRIPTION:   Informed consent was confirmed.  The patient underwent general anaesthesia without difficulty.  The patient was positioned appropriately.  VTE prevention in place.  The patient's abdomen was clipped, prepped, & draped in a sterile fashion.  Surgical timeout confirmed our plan.  The patient was positioned in reverse Trendelenburg.  Abdominal entry was gained using Varess technique with a trach hook on the anterior abdominal wall fascia in the right upper abdomen.  Entry was clean.  I induced carbon dioxide insufflation.  Camera inspection  revealed no injury.  Extra ports were carefully placed under direct laparoscopic visualization.  I reflected the greater omentum and the upper abdomen the small bowel in the upper abdomen.  The patient was carefully  positioned.  The Intuitive daVinci robot was carefully docked with camera & instruments carefully placed.  The patient had a very inflamed segment of rectosigmoid colon along the distal left paracolic gutter towards the right lower quadrant.  I spent some time doing careful dissection to free adhesions.  I freed the deep rectosigmoid off the the anterior deep pelvis and came more proximally to the base of the uterus.  Plans quite obliterated.  I then worked on lateral to medial mobilization from the proximal descending colon more distally to the sigmoid colon.  Again encountered obliterated planes with dense adhesions along the left lateral anterolateral pelvic rim.     I then decided to come from the retroperitoneal side ago be to lateral dissection.  I scored the base of peritoneum of the medial side of the mesentery of the left colon from the ligament of Treitz to the peritoneal reflection of the mid rectum.   I elevated the sigmoid mesentery and entered into the retro-mesenteric plane. We were able to identify the left ureter and gonadal vessels. We kept those posterior within the retroperitoneum and elevated the left colon mesentery off that. I did isolate the inferior mesenteric artery (IMA) pedicle but did not ligate it yet.  I continued distally and got into the avascular plane posterior to the mesorectum. This allowed me to help mobilize the rectum as well by freeing the mesorectum off the sacrum.  I mobilized the peritoneal coverings towards the peritoneal reflection on both the right and left sides of the rectum.  Can follow the left ureter.  Carefully freed inflamed sigmoid colon off the retroperitoneum including the left ureter adnexa.  I stayed away from the right and left ureters.  I kept the lateral vascular pedicles to the rectum intact.  Eventually freed off the concrete adhesions off the rest of the adnexa uterus and left pelvic brim.  Encountered a cemented abscess cavity with: Perforated  into it.  It was walled off though.  No pus.  Freddrick March that off staying closer to the colon side, leaving a cuff of abscess wall to avoid ureteral rather retroperitoneal injury.  Area was quite oozy.  Did careful meticulous hemostasis using bipolar cautery.  Avoid using clips or more intense energy.   I skeletonized the lymph nodes off the inferior mesenteric artery pedicle.  I transected the superior hemorrhoidal artery off the inferior mesenteric artery, leaving the left colic pedicle in place.  I further mobilized the colon all lateral to medial fashion.  I mobilized the left colon in a lateral to medial fashion off the line of Toldt up towards the splenic flexure to ensure good mobilization of the remaining left colon to reach into the pelvis.  Seems like I had good region redundancy.  I had to untwist the sigmoid colon.  It was densely adherent to itself in a corkscrew/pigtail.  Also densely adherent to the proximal rectum.  I was able to get in the presacral plane and freeze the mesorectum more distally towards the mid distal rectum to get better mobilization.  Came around anteriorly until I found a section of noninflamed mid rectum.  I skeletonized at the proximal mesorectum and transected at the proximal rectum using a robotic 45 mm stapler.  I chose a region at the descending/sigmoid junction  that was soft and easily reached down to the rectal stump.  I transected the mesentery of the colon radially to preserve remaining colon blood supply.  I placed a wound protector through a Pfannenstiel incision in the suprapubic region, taking care to avoid bladder injury.  I was able to eviscerate the rectosigmoid and descending colon out the wound.   I clamped the colon proximal to this area using a soft bowel clamp. I transected at the descending/sigmoid junction with a scalpel. I got healthy bleeding mucosa.  We sent the rectosigmoid colon specimen off to go to pathology.  We sized the colon orifice.  I chose a 33  EEA anvil stapler system. I placed the anvil to the open end of the proximal remaining colon and closed around it using a 0 Prolene pursestring.  We did copious irrigation with crystalloid solution.  Hemostasis was good.    The distal end of the remaining colon did not reach well down to the rectal stump, therefore, splenic flexure mobilization was needed.  I isolated the inferior mesenteric vein off of the ligament of Treitz just cephalad to that as well.  After confirming the left ureter was out of the way, I went ahead and ligated the inferior mesenteric artery pedicle just near its takeoff from the aorta.  Came more proximally above the ligament of Treitz to isolate and ligate the inferior mesenteric vein in a similar fashion.  I freed the splenic flexure and left colon off its retroperitoneal attachments including the left kidney.  They came around to the descending colon more towards the splenic flexure and freed it off.  I freed some greater omentum off as well.  With that I had better release and splenic flexure much more easily reached down into the pelvis.  The anvil reach down to the mid pelvis without tension.  We ensured hemostasis.    Dr Marcello Moores scrubbed down and did gentle anal dilation and advanced the EEA stapler up the rectal stump. The spike was brought out at the provimal end of the rectal stump under direct visualization.  I attached the anvil of the proximal colon the spike of the stapler. Anvil was tightened down and held clamped for 60 seconds. The EEA stapler was fired and held clamped for 30 seconds. The stapler was released & removed. We noted 2 excellent anastomotic rings. Blue stitch is in the proximal ring.  Dr Marcello Moores did rigid proctoscopy noted the anastomosis was at 12 cm from the anal verge consistent with the proximal rectum.  We did a final irrigation of antibiotic solution (900 mg clindamycin/240 mg gentamicin in a liter of crystalloid) & held that for the pelvic air leak test  .  The rectum was insufflated the rectum while clamping the colon proximal to that anastomosis.  There was a negative air leak test. There was no tension of mesentery or bowel at the anastomosis.   Tissues looked viable.  Ureters & bowel uninjured.  She had some old blood in there but after irrigation saw that hemostasis was good.  The anastomosis looked healthy.  Drain placed is noted above.  A large swath the greater omentum was brought down to help protect the anastomosis anteriorly and patch away from the left anterolateral pelvic abscess wall cavity  Endoluminal gas was evacuated.  Ports & wound protector removed.  We changed gloves.  We aspirated the antibiotic irrigation.  Hemostasis was good.  Sterile unused instruments were used from this point out per colon SSI prevention  protocol.  I closed the 52mm port sites using Monocryl stitch and sterile dressing.  I closed the Pfannenstiel wound using a 0 Vicryl vertical peritoneal closure and a #1 PDS transverse anterior rectal fascial closure. I closed the skin with some interrupted Monocryl stitches. I placed antibiotic-soaked wicks into the closure at the corners x2. I placed a sterile dressing.    Patient is being extubated go to recovery room. I had discussed postop care with the patient in detail the office & in the holding area. Instructions are written.  I'm about to locate family and discuss it with them as well.  [name], M.D., F.A.C.S. Gastrointestinal and Minimally Invasive Surgery [facility] [address] [place], [place] [address] [phone] Main / Paging

## 2015-10-18 NOTE — Anesthesia Postprocedure Evaluation (Signed)
Anesthesia Post Note  Patient: Elizabeth Mullins  Procedure(s) Performed: Procedure(s) (LRB): XI ROBOT ASSISTED LAPAROSCOPIC MINIMALLY INVASIVE SIGMOID  COLECTOMY RIGID PROCTOSCOPY (N/A)  Patient location during evaluation: PACU Anesthesia Type: General Level of consciousness: awake and alert Pain management: pain level controlled Vital Signs Assessment: post-procedure vital signs reviewed and stable Respiratory status: spontaneous breathing, nonlabored ventilation, respiratory function stable and patient connected to nasal cannula oxygen Cardiovascular status: blood pressure returned to baseline and stable Postop Assessment: no signs of nausea or vomiting Anesthetic complications: no    Last Vitals:  Filed Vitals:   10/18/15 1930 10/18/15 1949  BP: 110/60 133/70  Pulse: 87 88  Temp: 36.3 C 37.9 C  Resp: 19 16    Last Pain:  Filed Vitals:   10/18/15 1951  PainSc: 5                  Teniqua Marron L

## 2015-10-18 NOTE — Anesthesia Preprocedure Evaluation (Addendum)
Anesthesia Evaluation  Patient identified by MRN, date of birth, ID band Patient awake    Reviewed: Allergy & Precautions, H&P , NPO status , Patient's Chart, lab work & pertinent test results, reviewed documented beta blocker date and time   Airway Mallampati: II  TM Distance: >3 FB Neck ROM: Limited    Dental  (+) Dental Advisory Given, Caps Veneers upper front:   Pulmonary neg pulmonary ROS,    Pulmonary exam normal breath sounds clear to auscultation       Cardiovascular hypertension, Pt. on home beta blockers and Pt. on medications Normal cardiovascular exam Rhythm:regular Rate:Normal     Neuro/Psych  Headaches, Limited cervical range of motion negative neurological ROS  negative psych ROS   GI/Hepatic negative GI ROS, Neg liver ROS, ? Pancreatic mass   Endo/Other  negative endocrine ROS  Renal/GU negative Renal ROS  negative genitourinary   Musculoskeletal   Abdominal   Peds  Hematology negative hematology ROS (+)   Anesthesia Other Findings   Reproductive/Obstetrics negative OB ROS                           Anesthesia Physical Anesthesia Plan  ASA: III  Anesthesia Plan: General   Post-op Pain Management:    Induction: Intravenous  Airway Management Planned: Oral ETT  Additional Equipment:   Intra-op Plan:   Post-operative Plan: Extubation in OR  Informed Consent: I have reviewed the patients History and Physical, chart, labs and discussed the procedure including the risks, benefits and alternatives for the proposed anesthesia with the patient or authorized representative who has indicated his/her understanding and acceptance.   Dental Advisory Given  Plan Discussed with: CRNA and Surgeon  Anesthesia Plan Comments:         Anesthesia Quick Evaluation

## 2015-10-18 NOTE — Interval H&P Note (Signed)
History and Physical Interval Note:  10/18/2015 12:22 PM  Elizabeth Mullins  has presented today for surgery, with the diagnosis of Persistent sigmoid colectomy rigid proctoscopy   The various methods of treatment have been discussed with the patient and family. After consideration of risks, benefits and other options for treatment, the patient has consented to  Procedure(s): XI [place] PROCTOSCOPY (N/A) as a surgical intervention .  The patient's history has been reviewed, patient examined, no change in status, stable for surgery.  I have reviewed the patient's chart and labs.  Questions were answered to the patient's satisfaction.     [name] C.

## 2015-10-18 NOTE — Anesthesia Procedure Notes (Signed)
Procedure Name: Intubation Date/Time: 10/18/2015 1:37 PM Performed by: Glory Buff Pre-anesthesia Checklist: Patient identified, Emergency Drugs available, Suction available and Patient being monitored Patient Re-evaluated:Patient Re-evaluated prior to inductionOxygen Delivery Method: Circle system utilized Preoxygenation: Pre-oxygenation with 100% oxygen Intubation Type: IV induction Ventilation: Mask ventilation without difficulty Laryngoscope Size: Miller and 2 Grade View: Grade I Tube type: Oral Tube size: 7.0 mm Number of attempts: 1 Airway Equipment and Method: Stylet and Oral airway Placement Confirmation: ETT inserted through vocal cords under direct vision,  positive ETCO2 and breath sounds checked- equal and bilateral Secured at: 20 cm Tube secured with: Tape Dental Injury: Teeth and Oropharynx as per pre-operative assessment

## 2015-10-18 NOTE — Transfer of Care (Signed)
Immediate Anesthesia Transfer of Care Note  Patient: Elizabeth Mullins  Procedure(s) Performed: Procedure(s): XI ROBOT ASSISTED LAPAROSCOPIC MINIMALLY INVASIVE SIGMOID  COLECTOMY RIGID PROCTOSCOPY (N/A)  Patient Location: PACU  Anesthesia Type:General  Level of Consciousness:  sedated, patient cooperative and responds to stimulation  Airway & Oxygen Therapy:Patient Spontanous Breathing and Patient connected to face mask oxgen  Post-op Assessment:  Report given to PACU RN and Post -op Vital signs reviewed and stable  Post vital signs:  Reviewed and stable  Last Vitals:  Filed Vitals:   10/18/15 0940  BP: 132/85  Pulse: 85  Temp: 36.7 C  Resp: 16    Complications: No apparent anesthesia complications

## 2015-10-18 NOTE — Progress Notes (Signed)
Elizabeth Mullins  03/29/1932 [id number]  Patient Care Team: Loraine Leriche, MD as PCP - General (Internal Medicine) Michael Boston, MD as Consulting Physician (General Surgery)  This patient is a 82 y.o.female    Comfortable in PACU HD stable Minimal bloody drain output  I updated the patient's status to the patient's family.  Daughter, Granddaughter, Son.  Recommendations were made.  Questions were answered.  They expressed understanding & appreciation.   Patient Active Problem List   Diagnosis Date Noted  . Diverticulitis of large intestine with perforation and abscess without bleeding 10/18/2015  . Anxiety 10/12/2015  . Diverticulitis of colon with perforation 08/24/2015  . Intra-abdominal abscess ([place]) 08/24/2015  . Abnormal urinalysis 08/24/2015  . ?? Pancreatic mass 08/24/2015  . IBS (irritable bowel syndrome) 08/24/2015  . Inflammatory arthropathy ([place]) 08/24/2015  . Chronic back pain 08/24/2015  . Cochlear hearing loss, bilateral 06/08/2015  . Diverticulitis large intestine 05/25/2015  . DD (diverticular disease) 10/31/2014  . Generalized OA 10/31/2014  . Headache disorder 10/25/2014  . Neoplasm of meninges ([place]) 10/25/2014  . Essential (primary) hypertension 10/04/2013  . Arthritis 10/04/2013  . Excess weight 10/04/2013  . Degeneration of intervertebral disc of lumbar region 09/22/2013  . Lumbar radiculopathy 09/22/2013    Past Medical History  Diagnosis Date  . Diverticulitis   . Hypertension   . Scoliosis   . Allergic headache   . Arthritis     "all over"  . Chronic back pain     "top of my neck to bottom of my feet" (08/25/2015)  . H/O drainage of abscess     left buttocks area -closed drainage bag in place 10-12-15   . Impaired mobility     "cervical neck- range of motion limitations"    Past Surgical History  Procedure Laterality Date  . Appendectomy  1960s  . Tonsillectomy    . Total knee arthroplasty Right   . Ct guide abscess drainage  ([facility]  hx)  08/25/2015    pelvic diverticular abscess  . Joint replacement    . Vaginal hysterectomy  1973    "partial"  . Dilation and curettage of uterus      Social History   Social History  . Marital Status: Widowed    Spouse Name: N/A  . Number of Children: N/A  . Years of Education: N/A   Occupational History  . Not on file.   Social History Main Topics  . Smoking status: Never Smoker   . Smokeless tobacco: Never Used  . Alcohol Use: No  . Drug Use: No  . Sexual Activity: No   Other Topics Concern  . Not on file   Social History Narrative    History reviewed. No pertinent family history.  Current Facility-Administered Medications  Medication Dose Route Frequency Provider Last Rate Last Dose  . bupivacaine liposome (EXPAREL) 1.3 % injection 266 mg  20 mL Infiltration On Call to OR Michael Boston, MD      . clindamycin (CLEOCIN) 900 mg, gentamicin (GARAMYCIN) 240 mg in sodium chloride 0.9 % 1,000 mL for intraperitoneal lavage   Intraperitoneal To OR Michael Boston, MD      . fentaNYL (SUBLIMAZE) injection 25-50 mcg  25-50 mcg Intravenous Q5 min PRN Rod Mae, MD   25 mcg at 10/18/15 1815  . fentaNYL (SUBLIMAZE) injection 25-50 mcg  25-50 mcg Intravenous Q5 min PRN Lyndle Herrlich, MD      . HYDROmorphone (DILAUDID) injection 0.5 mg  0.5 mg Intravenous Q15 min PRN Juanda Crumble  E[place], MD      . lactated ringers infusion   Intravenous Continuous Rod Mae, MD 125 mL/hr at 10/18/15 1117    . ondansetron (ZOFRAN) injection 4 mg  4 mg Intravenous Once PRN Lyndle Herrlich, MD         Allergies  Allergen Reactions  . Codeine Nausea And Vomiting and Other (See Comments)    Not a true allergy she needs nausea medication if given this medication.  . Demerol [Meperidine] Nausea And Vomiting and Other (See Comments)    Not a true allergy she needs nausea medication if given this medication.   . Morphine And Related Nausea And Vomiting and Other (See Comments)    Not a true allergy she  needs nausea medication if given this medication.   . Nsaids Nausea And Vomiting and Other (See Comments)    Not a true allergy she needs nausea medication if given this medication.   . Oxycodone Other (See Comments)    "Doesn't do anything for her"    BP 119/63 mmHg  Pulse 78  Temp(Src) 97.3 F (36.3 C) (Oral)  Resp 21  Ht 4\' 11"  (1.499 m)  Wt 63.957 kg (141 lb)  BMI 28.46 kg/m2  SpO2 100%  Ct Abdomen Pelvis W Contrast  10/03/2015  CLINICAL DATA:  82 year old female with a history of diverticular abscess status post percutaneous drain placement on 08/25/2015. Patient has had a persistent fistulous connection with the sigmoid colon as well as recurrent abscess collections or around the percutaneous drainage catheter. EXAM: CT ABDOMEN AND PELVIS WITH CONTRAST TECHNIQUE: Multidetector CT imaging of the abdomen and pelvis was performed using the standard protocol following bolus administration of intravenous contrast. CONTRAST:  110mL ISOVUE-300 IOPAMIDOL (ISOVUE-300) INJECTION 61% COMPARISON:  Numerous prior studies, most recently 09/28/2015 FINDINGS: The left trans gluteal approach percutaneous drainage catheter has pulled back slightly and is no longer within the pelvic fluid collection. A peripherally enhancing fluid and gas collection in the rectovesicular recess has significantly increased in size over the recent interval since the prior study now measuring up to 7.4 x 4.6 cm (best imaged on the sagittal reformatted images) compared to less than 2 cm previously. There is persistent extensive sigmoid diverticulosis. No definite focal inflammatory change to suggest recurrent acute diverticulitis. Additional findings including a minimally complex low-attenuation collection in the pancreatic head measuring up to 3.1 cm remain essentially unchanged. There is no evidence of additional new abscess, fluid collection or other acute abnormality. IMPRESSION: Unfortunately, the left trans gluteal  percutaneous drainage catheter has pulled back out of the deep pelvic abscess since the prior study and there has been a significant interval recurrence of the abscess which now measures up to 7.4 x 4.6 cm compared to less than 2 cm just 5 days ago. No definite new inflammatory change in the adjacent sigmoid colon to suggest recurrent diverticulitis. This likely represents abscess formation secondary to persistent leak/fistula from the sigmoid colon. Electronically Signed   By: Jacqulynn Cadet M.D.   On: 10/03/2015 11:33   Ct Abdomen Pelvis W Contrast  09/28/2015  CLINICAL DATA:  Left lower quadrant pain and nausea in patient with known diverticulitis and abscess with drain in place EXAM: CT ABDOMEN AND PELVIS WITH CONTRAST TECHNIQUE: Multidetector CT imaging of the abdomen and pelvis was performed using the standard protocol following bolus administration of intravenous contrast. CONTRAST:  174mL ISOVUE-300 IOPAMIDOL (ISOVUE-300) INJECTION 61%, 76mL OMNIPAQUE IOHEXOL 300 MG/ML SOLN COMPARISON:  CT pelvis of 09/21/2015 and CT abdomen pelvis  of 09/20/2015 FINDINGS: The lung bases remain clear. The liver enhances and a cyst is again noted in the left lobe of liver. No other focal hepatic abnormality is seen. The gallbladder is slightly distended but no calcified gallstones are seen and the gallbladder wall does not appear to be thickened. The low-attenuation structure within the body of the pancreas is not as well seen on the current study but again further assessment with MR would be recommended. No dilatation of the pancreatic duct is evident. The adrenal glands and spleen are unremarkable. The stomach is moderately fluid distended with no abnormality noted. The kidneys enhance with no calculus or mass. Mild parenchymal loss is noted along the inferior posterolateral left kidney which is stable. On delayed images the pelvocaliceal systems are unremarkable and the proximal ureters are normal in caliber. The  abdominal aorta is normal in caliber. The trans gluteal drainage catheter is unchanged in position with the tip lying just above the vaginal cuff to the left of midline. The abscess cavity contains a small amount of air but it has definitely decreased in size. There is an adjacent smaller low-attenuation collection measuring 17 mm on image 54 which may represent a tiny adjacent abscess. There does appear to have been some interval improvement in the edema of the sigmoid colon at the site of diverticulitis with decrease in pericolonic strandiness. Multiple colonic diverticula are again are noted diffusely throughout the colon. The terminal ileum is unremarkable. Urinary bladder is decompressed and cannot be well evaluated. Fat enters the left inguinal canal. IMPRESSION: 1. Only a small abscess cavity remains at the site of abscess drainage catheter with a small amount air within it. 2. Probable small adjacent abscess of 17 mm in diameter in the midline. 3. Some improvement in overall appearance of diverticulitis of the sigmoid colon. 4. The low-attenuation structure within the body of the pancreas is not as well seen but does persist and MRI of the pancreas is recommended. Electronically Signed   By: Ivar Drape M.D.   On: 09/28/2015 14:30   Ct Abdomen Pelvis W Contrast  09/20/2015  CLINICAL DATA:  Follow-up diverticular abscess. EXAM: CT ABDOMEN AND PELVIS WITH CONTRAST TECHNIQUE: Multidetector CT imaging of the abdomen and pelvis was performed using the standard protocol following bolus administration of intravenous contrast. CONTRAST:  132mL ISOVUE-300 IOPAMIDOL (ISOVUE-300) INJECTION 61% COMPARISON:  09/06/2015 FINDINGS: Lower chest: Few pleural-based densities along the right lower lobe appear to be new and probably related to atelectasis. No large pleural effusions. Hepatobiliary: Again noted is a lobulated low-density structure in left hepatic lobe roughly measuring 1.4 cm and most likely represents a cyst.  Otherwise, normal appearance of the liver without biliary dilatation. Normal appearance of the gallbladder without inflammatory changes. Portal venous system is patent. Pancreas: There continues to be a low-density lesion at the pancreatic head measuring 2.1 x 1.5 cm. No significant biliary dilatation. No inflammation around the pancreas. Spleen: Stable appearance of spleen without enlargement. Adrenals/Urinary Tract: Normal adrenal glands. Again noted is a low-density structure along the left kidney upper pole which probably represents a cyst this may be hyperdense or complex. No suspicious renal findings. No hydronephrosis. Normal appearance of the urinary bladder. Stomach/Bowel: Left transgluteal drainage catheter in the pelvis. Enlarging fluid collection extending cephalad from the pigtail drainage catheter. This fluid collection appears to be mildly loculated or complex. This collection measures 5.3 x 3.5 x 3.8 cm. There is a new small low-density fluid collection located right of the drain, measuring 1.5  cm, which may be connected to the larger more cephalad collection. Small amount of gas within this new fluid cavity and findings are compatible with a new or enlarging abscess collection. Again noted is extensive diverticulosis in the sigmoid colon and there continues to be inflammation along the posterior aspect the sigmoid colon which has minimally improved. No evidence for bowel dilatation or obstruction. Wall thickening at the gastric cardia or GE junction which is similar to prior examinations. This finding may be related to a small hiatal hernia but indeterminate. Vascular/Lymphatic: Atherosclerotic disease in the abdominal aorta without aneurysm. No significant lymphadenopathy abdomen or pelvis. Reproductive: Uterus has been surgically removed. Other: Left inguinal hernia containing fat. Musculoskeletal: Scoliosis in lumbar spine with multilevel degenerative disease. IMPRESSION: New complex fluid  collection(s) around the pelvic drainage catheter and adjacent to the sigmoid colon. Findings are suggestive for recurrent or new diverticular abscesses. Decreasing but persistent inflammatory changes around the sigmoid colon compatible with diverticulitis. Persistent 2.1 cm low-density structure at the pancreatic head which could represent a cystic lesion. Recommend further characterization with an abdominal MRI. Persistent wall thickening at the gastroesophageal junction. Findings may be associated with a hiatal hernia but cannot exclude inflammation or wall thickening in this area. Electronically Signed   By: Markus Daft M.D.   On: 09/20/2015 13:29   Ir Sinus/fist Tube Chk-non Gi  09/21/2015  INDICATION: 82 year old female with diverticular abscesses. Recent CT examination demonstrated new abscess collections adjacent to the decompressed abscess that contains a pigtail catheter. Plan for a drain injection and possible repositioning of a drainage catheter within the new abscess collections. EXAM: SINUS TRACT INJECTION / FISTULOGRAM; DRAIN EXCHANGE WITH FLUOROSCOPY COMPARISON:  None. MEDICATIONS: The patient is currently admitted to the hospital and receiving intravenous antibiotics. The antibiotics were administered within an appropriate time frame prior to the initiation of the procedure. ANESTHESIA/SEDATION: Fentanyl 50 mcg IV; Versed 1.0 mg IV Moderate Sedation Time:  13 minutes The patient was continuously monitored during the procedure by the interventional radiology nurse under my direct supervision. COMPLICATIONS: None immediate. TECHNIQUE: Informed written consent was obtained from the patient after a thorough discussion of the procedural risks, benefits and alternatives. All questions were addressed. Maximal Sterile Barrier Technique was utilized including caps, mask, sterile gowns, sterile gloves, sterile drape, hand hygiene and skin antiseptic. A timeout was performed prior to the initiation of the  procedure. PROCEDURE: Patient was placed prone. The existing drain and surrounding skin were prepped and draped in a sterile fashion. Contrast was injected through the drainage catheter. This drain was cut and removed over a Bentson wire. A 40 cm, 5 French catheter was advanced into the cavity and was positioned in a small sinus tract. Additional injections were performed. This catheter was removed over a Bentson wire and a new 10.2 [place] drain was reconstituted within the small abscess cavity. Skin was anesthetized with 1% lidocaine and sutured to the skin. A new gravity bag was placed. Bandage placed over the drain. FINDINGS: Small residual abscess cavity in the pelvis. There is a small sinus tract extending cephalad. Catheter was placed near the sinus tract and contrast injection confirmed a fistula tract to the colon. Due to the persistent fistula tract, a new 10 French drain was placed. There was no connection to the new abscess collection. IMPRESSION: The abscess cavity containing the drain continues to have a fistula connection to the colon. Therefore, the drain was exchanged and attached to gravity bag. There was no connection between the drain collection  and the new abscess collection. Therefore, the patient will be transferred to CT for attempted CT-guided drain placement. Electronically Signed   By: Markus Daft M.D.   On: 09/21/2015 18:06   Ir Catheter Tube Change  09/21/2015  INDICATION: 82 year old female with diverticular abscesses. Recent CT examination demonstrated new abscess collections adjacent to the decompressed abscess that contains a pigtail catheter. Plan for a drain injection and possible repositioning of a drainage catheter within the new abscess collections. EXAM: SINUS TRACT INJECTION / FISTULOGRAM; DRAIN EXCHANGE WITH FLUOROSCOPY COMPARISON:  None. MEDICATIONS: The patient is currently admitted to the hospital and receiving intravenous antibiotics. The antibiotics were administered  within an appropriate time frame prior to the initiation of the procedure. ANESTHESIA/SEDATION: Fentanyl 50 mcg IV; Versed 1.0 mg IV Moderate Sedation Time:  13 minutes The patient was continuously monitored during the procedure by the interventional radiology nurse under my direct supervision. COMPLICATIONS: None immediate. TECHNIQUE: Informed written consent was obtained from the patient after a thorough discussion of the procedural risks, benefits and alternatives. All questions were addressed. Maximal Sterile Barrier Technique was utilized including caps, mask, sterile gowns, sterile gloves, sterile drape, hand hygiene and skin antiseptic. A timeout was performed prior to the initiation of the procedure. PROCEDURE: Patient was placed prone. The existing drain and surrounding skin were prepped and draped in a sterile fashion. Contrast was injected through the drainage catheter. This drain was cut and removed over a Bentson wire. A 40 cm, 5 French catheter was advanced into the cavity and was positioned in a small sinus tract. Additional injections were performed. This catheter was removed over a Bentson wire and a new 10.2 [place] drain was reconstituted within the small abscess cavity. Skin was anesthetized with 1% lidocaine and sutured to the skin. A new gravity bag was placed. Bandage placed over the drain. FINDINGS: Small residual abscess cavity in the pelvis. There is a small sinus tract extending cephalad. Catheter was placed near the sinus tract and contrast injection confirmed a fistula tract to the colon. Due to the persistent fistula tract, a new 10 French drain was placed. There was no connection to the new abscess collection. IMPRESSION: The abscess cavity containing the drain continues to have a fistula connection to the colon. Therefore, the drain was exchanged and attached to gravity bag. There was no connection between the drain collection and the new abscess collection. Therefore, the patient will  be transferred to CT for attempted CT-guided drain placement. Electronically Signed   By: Markus Daft M.D.   On: 09/21/2015 18:06   Ct Image Guided Drainage By Percutaneous Catheter  10/04/2015  CLINICAL DATA:  Diverticular abscess post percutaneous drain placement 08/25/2015. Fistula to the sigmoid colon had been demonstrated. The percutaneous drain catheter became dislodged, and there is significant interval recurrence of the abscess measuring up to 7.4 cm. EXAM: CT GUIDED DRAINAGE OF PELVIC ABSCESS ANESTHESIA/SEDATION: Intravenous Fentanyl and Versed were administered as conscious sedation during continuous monitoring of the patient's level of consciousness and physiological / cardiorespiratory status by the radiology RN, with a total moderate sedation time of 15 minutes. PROCEDURE: The procedure, risks, benefits, and alternatives were explained to the patient. Questions regarding the procedure were encouraged and answered. The patient understands and consents to the procedure. Initially, patient placed supine and limited axial scans through the pelvis obtained. However, no safe percutaneous approach was identified. Accordingly, patient was repositioned prone and limited axial scans through the pelvis obtained. The collection was localized and an appropriate skin  entry site was determined and marked. The operative field was prepped with chlorhexidinein a sterile fashion, and a sterile drape was applied covering the operative field. A sterile gown and sterile gloves were used for the procedure. Local anesthesia was provided with 1% Lidocaine. Under CT fluoroscopic guidance, an 18 gauge trocar needle was advanced into the collection. Purulent material could be aspirated. An Amplatz wire advanced easily within the collection, confirmed on CT. Tract dilated to facilitate placement of a 12 French pigtail catheter, placed centrally within the collection. 40 mL of purulent material were aspirated, a sample sent for  Gram stain, culture and sensitivity. Catheter secured externally with 0 Prolene suture and StatLock and placed to gravity drain bag. The patient tolerated the procedure well. COMPLICATIONS: None immediate FINDINGS: CT again confirmed the complex cul-de-sac collection. Percutaneous drain catheter placed as above. 40 mL purulent aspirate sent for Gram stain, culture and sensitivity. IMPRESSION: 1. Technically successful CT-guided pelvic abscess drain catheter placement. Electronically Signed   By: Lucrezia Europe M.D.   On: 10/04/2015 12:42   Ct Image Guided Drainage By Percutaneous Catheter  09/21/2015  INDICATION: 82 year old with diverticular abscesses. The patient already has a pigtail catheter in one pelvic abscess but she has developed a new pelvic fluid collection which is concerning for an abscess. Recent drain injection and drain exchange did not show a connection between the existing drain cavity and a new abscess collection. Therefore, we will attempt a new drain placement with CT guidance. EXAM: ATTEMPTED CT GUIDED DRAINAGE OF PELVIC ABSCESS MEDICATIONS: None ANESTHESIA/SEDATION: 2 mg IV Versed 100 mcg IV Fentanyl Moderate Sedation Time:  25 minutes The patient was continuously monitored during the procedure by the interventional radiology nurse under my direct supervision. COMPLICATIONS: None immediate. TECHNIQUE: Informed written consent was obtained from the patient after a thorough discussion of the procedural risks, benefits and alternatives. All questions were addressed. Maximal Sterile Barrier Technique was utilized including caps, mask, sterile gowns, sterile gloves, sterile drape, hand hygiene and skin antiseptic. A timeout was performed prior to the initiation of the procedure. PROCEDURE: Patient was placed prone on the CT scanner. Images through the pelvis were obtained. The left gluteal area was prepped and draped in sterile fashion. Skin was anesthetized with 1% lidocaine. An 18 gauge needle was  directed towards the fluid collection in the upper left hemipelvic region. There was a very small percutaneous window in order to safely place a drain. Needle was directed lateral to the left side of the sacrum and was positioned along the caudal aspect of the collection. Needle was directed into this area but no fluid could be aspirated. Attempted to direct the needle more cephalad but this was not successful and there was concern about adjacent bowel in this area. Therefore, the procedure was stopped. Needle was removed and bandage placed the puncture site. FINDINGS: Pigtail drainage catheter in the left hemipelvis from a transgluteal approach. Cephalad to this drain, there is a low-density collection thought to represent a new abscess. An 18 gauge needle was directed towards the collection from a transgluteal approach. Needle was placed near the caudal margin of this collection but no fluid could be aspirated. The needle could not be safely redirected more cephalad into the collection. Therefore, the procedure was stopped. IMPRESSION: Attempted placement of a drainage catheter within the pelvic fluid collection/abscess with CT guidance. This procedure was not successful due to the location of the fluid collection and adjacent bowel structures. Electronically Signed   By: Markus Daft  M.D.   On: 09/21/2015 18:16    Note: This dictation was prepared with Dragon/digital dictation along with [facility] technology. Any transcriptional errors that result from this process are unintentional.

## 2015-10-18 NOTE — H&P (View-Only) (Signed)
Elizabeth Mullins 09/25/2015 11:12 AM Location: [facility] Patient #: [id number] DOB: 1932-03-16 Widowed / Language: [name] / Race: White Female  History of Present Illness [name]; 09/25/2015 1:56 PM) The patient is a 82 year old female who presents with diverticulitis. Note for "Diverticulitis": Patient comes the office to consider surgery for diverticulitis with abscess and now chronic fistula controlled with drain.  Elderly active woman. Strong family history of colon problems such as diverticulosis and diverticulitis and colon polyps. Usually gets colonoscopy every 5 years. Last 14 years ago. Negative. Relocated from [place] 3 years ago. Has had lower abdominal pains over the past year. Presumed diverticulitis. Had been on at least 4 rounds of oral Cipro/Flagyl. Some improvement but would keep coming back. Then she felt worse. Admitted for diverticulitis with abscess early April. Has some arthropathies and recently had been started on a prednisone taper. Admitted April 6 with 6 cm abscess. Percutaneously drained. Improved. Discharged 4 days later. Follow-up drain study shows fistula to colon in the abscess cavity. Repeat CT scan shows new abscess. Not able to find a safe window for percutaneous drainage. Restarted on oral antibiotics. A little better. Occasionally has pain. Oxycodone didn't help. Norco does. Has run out. She comes today with her daughter. They have many questions.  She normally is up and very active. Can walk several miles without any difficulty. However this is been frustrating for her. Her appetite is down. She's lost over 10 pounds. Energy level is down. She is to have a bowel movement every day but now has been more constipated. Usually probiotic and MiraLAX have helped. She does not smoke. She is not a diabetic. Had appendectomy and hysterectomy many decades ago. None since. No personal nor family history of GI/colon  cancer, inflammatory bowel disease, irritable bowel syndrome, allergy such as Celiac Sprue, dietary/dairy problems, colitis, ulcers nor gastritis. No recent sick contacts/gastroenteritis. No travel outside the country. No changes in diet. No dysphagia to solids or liquids. No significant heartburn or reflux. No hematochezia, hematemesis, coffee ground emesis. No evidence of prior gastric/peptic ulceration.   Other Problems Davy Pique Bynum, CMA; 09/25/2015 11:13 AM) Back Pain Diverticulosis High blood pressure  Past Surgical History Davy Pique Bynum, CMA; 09/25/2015 11:13 AM) Appendectomy Breast Biopsy Right. Hysterectomy (not due to cancer) - Partial Knee Surgery Right. Tonsillectomy  Diagnostic Studies History Marjean Donna, CMA; 09/25/2015 11:13 AM) Colonoscopy 5-10 years ago  Allergies Davy Pique Bynum, CMA; 09/25/2015 11:13 AM) No Known Drug Allergies 09/25/2015  Medication History (Sonya Bynum, CMA; 09/25/2015 11:14 AM) Atenolol (50MG  Tablet, Oral) Active. OxyCODONE HCl (5MG  Tablet, Oral) Active. Vigamox (0.5% Solution, Ophthalmic) Active. Raloxifene HCl (60MG  Tablet, Oral) Active. Gabapentin (800MG  Tablet, Oral) Active. MetroNIDAZOLE (500MG  Tablet, Oral) Active. Neomycin-Polymyxin-Dexameth (3.5-10000-0.1 Ointment, Ophthalmic) Active. PrednisoLONE Acetate (1% Suspension, Ophthalmic) Active. Medications Reconciled  Social History Marjean Donna, CMA; 09/25/2015 11:13 AM) Caffeine use Coffee. No alcohol use No drug use Tobacco use Never smoker.  Family History Marjean Donna, CMA; 09/25/2015 11:13 AM) Arthritis Daughter, Mother, Sister. Hypertension Mother. Prostate Cancer Father.  Pregnancy / Birth History Marjean Donna, [place]; 09/25/2015 11:13 AM) Age at menarche 63 years.     Review of Systems Davy Pique Bynum CMA; 09/25/2015 11:13 AM) General Present- Appetite Loss, Fatigue and Weight Loss. Not Present- Chills, Fever, Night Sweats and Weight Gain. Skin Not Present-  Change in Wart/Mole, Dryness, Hives, Jaundice, New Lesions, Non-Healing Wounds, Rash and Ulcer. HEENT Present- Seasonal Allergies. Not Present- Earache, Hearing Loss, Hoarseness, Nose Bleed, Oral Ulcers, Ringing in  the Ears, Sinus Pain, Sore Throat, Visual Disturbances, Wears glasses/contact lenses and Yellow Eyes. Respiratory Not Present- Bloody sputum, Chronic Cough, Difficulty Breathing, Snoring and Wheezing. Breast Not Present- Breast Mass, Breast Pain, Nipple Discharge and Skin Changes. Cardiovascular Not Present- Chest Pain, Difficulty Breathing Lying Down, Leg Cramps, Palpitations, Rapid Heart Rate, Shortness of Breath and Swelling of Extremities. Gastrointestinal Present- Abdominal Pain. Not Present- Bloating, Bloody Stool, Change in Bowel Habits, Chronic diarrhea, Constipation, Difficulty Swallowing, Excessive gas, Gets full quickly at meals, Hemorrhoids, Indigestion, Nausea, Rectal Pain and Vomiting. Female Genitourinary Present- Frequency. Not Present- Nocturia, Painful Urination, Pelvic Pain and Urgency. Musculoskeletal Present- Back Pain and Joint Pain. Not Present- Joint Stiffness, Muscle Pain, Muscle Weakness and Swelling of Extremities. Neurological Not Present- Decreased Memory, Fainting, Headaches, Numbness, Seizures, Tingling, Tremor, Trouble walking and Weakness. Psychiatric Not Present- Anxiety, Bipolar, Change in Sleep Pattern, Depression, Fearful and Frequent crying. Endocrine Not Present- Cold Intolerance, Excessive Hunger, Hair Changes, Heat Intolerance, Hot flashes and New Diabetes. Hematology Not Present- Easy Bruising, Excessive bleeding, Gland problems, HIV and Persistent Infections.  Vitals (Sonya Bynum CMA; 09/25/2015 11:13 AM) 09/25/2015 11:13 AM Weight: 140 lb Height: 59in Body Surface Area: 1.58 m Body Mass Index: 28.28 kg/m  Pulse: 76 (Regular)  BP: 126/76 (Sitting, Left Arm, Standard)      Physical Exam [name]; 09/25/2015 1:57  PM)  General Mental Status-Alert. General Appearance-Not in acute distress, Not Sickly. Orientation-Oriented X3. Hydration-Well hydrated. Voice-Normal. Note: Not toxic. Not sickly  Integumentary Global Assessment Upon inspection and palpation of skin surfaces of the - Axillae: non-tender, no inflammation or ulceration, no drainage. and Distribution of scalp and body hair is normal. General Characteristics Temperature - normal warmth is noted.  Head and Neck Head-normocephalic, atraumatic with no lesions or palpable masses. Face Global Assessment - atraumatic, no absence of expression. Neck Global Assessment - no abnormal movements, no bruit auscultated on the right, no bruit auscultated on the left, no decreased range of motion, non-tender. Trachea-midline. Thyroid Gland Characteristics - non-tender.  Eye Eyeball - Left-Extraocular movements intact, No Nystagmus. Eyeball - Right-Extraocular movements intact, No Nystagmus. Cornea - Left-No Hazy. Cornea - Right-No Hazy. Sclera/Conjunctiva - Left-No scleral icterus, No Discharge. Sclera/Conjunctiva - Right-No scleral icterus, No Discharge. Pupil - Left-Direct reaction to light normal. Pupil - Right-Direct reaction to light normal.  ENMT Ears Pinna - Left - no drainage observed, no generalized tenderness observed. Right - no drainage observed, no generalized tenderness observed. Nose and Sinuses External Inspection of the Nose - no destructive lesion observed. Inspection of the nares - Left - quiet respiration. Right - quiet respiration. Mouth and Throat Lips - Upper Lip - no fissures observed, no pallor noted. Lower Lip - no fissures observed, no pallor noted. Nasopharynx - no discharge present. Oral Cavity/Oropharynx - Tongue - no dryness observed. Oral Mucosa - no cyanosis observed. Hypopharynx - no evidence of airway distress observed.  Chest and Lung Exam Inspection Movements - Normal and  Symmetrical. Accessory muscles - No use of accessory muscles in breathing. Palpation Palpation of the chest reveals - Non-tender. Auscultation Breath sounds - Normal and Clear.  Cardiovascular Auscultation Rhythm - Regular. Murmurs & Other Heart Sounds - Auscultation of the heart reveals - No Murmurs and No Systolic Clicks.  Abdomen Inspection Inspection of the abdomen reveals - No Visible peristalsis and No Abnormal pulsations. Umbilicus - No Bleeding, No Urine drainage. Palpation/Percussion Palpation and Percussion of the abdomen reveal - Soft, Non Tender, No Rebound tenderness, No Rigidity (guarding) and  No Cutaneous hyperesthesia. Note: Doughy soft. Mild diastases. Right paramedian vertical incisions consistent with prior appendectomy. Mild discomfort left lower quadrant but no hard mass. No hernias. No peritonitis. No guarding.  Female Genitourinary Sexual Maturity Tanner 5 - Adult hair pattern. Note: No vaginal bleeding nor discharge  Rectal Note: Deferred. Left transgluteal drain with mainly serous fluid..  Peripheral Vascular Upper Extremity Inspection - Left - No Cyanotic nailbeds, Not Ischemic. Right - No Cyanotic nailbeds, Not Ischemic.  Neurologic Neurologic evaluation reveals -normal attention span and ability to concentrate, able to name objects and repeat phrases. Appropriate fund of knowledge , normal sensation and normal coordination. Mental Status Affect - not angry, not paranoid. Cranial Nerves-Normal Bilaterally. Gait-Normal.  Neuropsychiatric Mental status exam performed with findings of-able to articulate well with normal speech/language, rate, volume and coherence, thought content normal with ability to perform basic computations and apply abstract reasoning and no evidence of hallucinations, delusions, obsessions or homicidal/suicidal ideation. Note: Somewhat tearful. Often retells same story/concerns but no true evidence of dementia. I think  she is just worn out and anxious  Musculoskeletal Global Assessment Spine, Ribs and Pelvis - no instability, subluxation or laxity. Right Upper Extremity - no instability, subluxation or laxity.  Lymphatic Head & Neck  General Head & Neck Lymphatics: Bilateral - Description - No Localized lymphadenopathy. Axillary  General Axillary Region: Bilateral - Description - No Localized lymphadenopathy. Femoral & Inguinal  Generalized Femoral & Inguinal Lymphatics: Left - Description - No Localized lymphadenopathy. Right - Description - No Localized lymphadenopathy.    Assessment & Plan [name]; 09/25/2015 2:03 PM)  ABSCESS OF SIGMOID COLON DUE TO DIVERTICULITIS (K57.20) Impression: Active elderly woman with repeated attacks of diverticulitis. Abscess development. Percutaneously drained. Improved.  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 Drain left in place. Felt worse.  Repeat CT scan shows a new, second abscess. Walled off without a safe window to drain. Somewhat control with oral antibiotics.  I think she requires segmental rectosigmoid resection. Hopefully the abscess will be walled off enough for resection and anastomosis. May need a temporary colostomy.  I think she should stay on antibiotics until surgery happens. Ciprofloxacin/metronidazole.  Ideally she will get a colonoscopy prior to surgery, but I worry with the abscesses that could worsen at the time of the procedure. She gets colonoscopies every 5 years given the strong family history of polyps and other: Issues. Claims last 14 years ago was normal when she lived in [place]. I asked them to try and give [place] the report just to clarify. Otherwise, hold off and keep it simple. Proceed with surgery.  Like to give 6 weeks from the initial abscess formation before proceeding with surgery. That would make it late May. Ideally would try and do a minimally invasive laparoscopic/robotic approach.  I spent  over an hour talking with the patient and her daughter. They had many questions. I gave him my card. I reintroduced myself more than once. The daughter especially kept interrupting to make sure her questions were answered. Initially the daughter seem guarded but in the end I think they feel more comfortable with proceeding with surgery. Gave him information. Again went over plans many times. Went over my background. Went over the fact our group does foreign colectomy is a year. Patient feels very comfortable with me. Daughter seems more comfortable. Alluded to a son that would have even more questions.  The partient' operative risks are probably increased with the abscess and advanced age, but  she is a very active woman.  Current Plans Started Ciprofloxacin HCl 500MG , 1 (one) Tablet twice a day, #14, 09/25/2015, Ref. x2. Local Order: to control diverticulitis/abscess until you get surgery Started MetroNIDAZOLE 500MG , 1 (one) Tablet twice a day, #14, 09/25/2015, Ref. x2. Local Order: to control diverticulitis/abscess until you have surgery Started Hydrocodone-Acetaminophen 5-325MG , 1-2 Tablet four times daily, as needed, #30, 09/25/2015, No Refill. 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 Continue drain until surgery.  ENCOUNTER FOR PREOPERATIVE EXAMINATION FOR GENERAL SURGICAL PROCEDURE (Z01.818)  Current Plans You are being scheduled for surgery - Our schedulers will call you.  You should hear from our office's scheduling department within 5 working days about the location, date, and time of surgery. We try to make accommodations for patient's preferences in scheduling surgery, but sometimes the OR schedule or the surgeon's schedule prevents [place] from making those accommodations.  If you have not heard from our office [phone]) in 5 working days, call the office and ask for your surgeon's nurse.  If you  have other questions about your diagnosis, plan, or surgery, call the office and ask for your surgeon's nurse.  Written instructions provided Pt Education - [facility] Colon Bowel Prep 2015 Miralax/Antibiotics Started Neomycin Sulfate 500MG , 2 (two) Tablet SEE NOTE, #6, 09/25/2015, No Refill. Local Order: TAKE TWO TABLETS AT 2 PM, 3 PM, AND 10 PM THE DAY PRIOR TO SURGERY Pt Education - [facility] Colectomy post-op instructions: discussed with patient and provided information. Pt Education - Pamphlet Given - Laparoscopic Colorectal Surgery: discussed with patient and provided information. Pt Education - [facility] Good Bowel Health (Jaquay Morneault) Pt Education - [facility] Pain Control (Braydin Aloi)

## 2015-10-19 DIAGNOSIS — M542 Cervicalgia: Secondary | ICD-10-CM

## 2015-10-19 LAB — BASIC METABOLIC PANEL
Anion gap: 5 (ref 5–15)
BUN: 9 mg/dL (ref 6–20)
CALCIUM: 7.8 mg/dL — AB (ref 8.9–10.3)
CHLORIDE: 105 mmol/L (ref 101–111)
CO2: 25 mmol/L (ref 22–32)
CREATININE: 0.68 mg/dL (ref 0.44–1.00)
GFR calc Af Amer: >60 mL/min (ref >60)
GFR calc non Af Amer: >60 mL/min (ref >60)
Glucose, Bld: 147 mg/dL — ABNORMAL HIGH (ref 65–99)
Potassium: 3.8 mmol/L (ref 3.5–5.1)
SODIUM: 135 mmol/L (ref 135–145)

## 2015-10-19 LAB — CBC
HEMATOCRIT: 27.5 % — AB (ref 36.0–46.0)
HEMOGLOBIN: 9.3 g/dL — AB (ref 12.0–15.0)
MCH: 31.2 pg (ref 26.0–34.0)
MCHC: 33.8 g/dL (ref 30.0–36.0)
MCV: 92.3 fL (ref 78.0–100.0)
Platelets: 281 10*3/uL (ref 150–400)
RBC: 2.98 MIL/uL — ABNORMAL LOW (ref 3.87–5.11)
RDW: 15.8 % — AB (ref 11.5–15.5)
WBC: 8.8 10*3/uL (ref 4.0–10.5)

## 2015-10-19 LAB — MAGNESIUM: MAGNESIUM: 1.6 mg/dL — AB (ref 1.7–2.4)

## 2015-10-19 MED ORDER — FENTANYL CITRATE (PF) 100 MCG/2ML IJ SOLN
25.0000 ug | INTRAMUSCULAR | Status: DC | PRN
  Administered 2015-10-19 (×3): 50 ug via INTRAVENOUS
  Filled 2015-10-19 (×3): qty 2

## 2015-10-19 MED ORDER — LACTATED RINGERS IV BOLUS (SEPSIS)
1000.0000 mL | Freq: Three times a day (TID) | INTRAVENOUS | Status: DC | PRN
  Administered 2015-10-19: 1000 mL via INTRAVENOUS
  Filled 2015-10-19: qty 1000

## 2015-10-19 MED ORDER — ENSURE ENLIVE PO LIQD
237.0000 mL | Freq: Two times a day (BID) | ORAL | Status: DC
  Administered 2015-10-19 – 2015-10-23 (×9): 237 mL via ORAL

## 2015-10-19 MED ORDER — TAB-A-VITE/IRON PO TABS
1.0000 | ORAL_TABLET | Freq: Every day | ORAL | Status: DC
  Administered 2015-10-19 – 2015-10-23 (×5): 1 via ORAL
  Filled 2015-10-19 (×5): qty 1

## 2015-10-19 MED ORDER — LACTATED RINGERS IV BOLUS (SEPSIS)
1000.0000 mL | Freq: Once | INTRAVENOUS | Status: AC
  Administered 2015-10-19: 1000 mL via INTRAVENOUS

## 2015-10-19 MED ORDER — HYDROMORPHONE HCL 1 MG/ML IJ SOLN
0.5000 mg | INTRAMUSCULAR | Status: DC | PRN
  Administered 2015-10-19 – 2015-10-20 (×3): 2 mg via INTRAVENOUS
  Administered 2015-10-21: 0.5 mg via INTRAVENOUS
  Administered 2015-10-21: 1 mg via INTRAVENOUS
  Administered 2015-10-21: 2 mg via INTRAVENOUS
  Administered 2015-10-21: 1 mg via INTRAVENOUS
  Administered 2015-10-21: 2 mg via INTRAVENOUS
  Administered 2015-10-22 (×2): 1 mg via INTRAVENOUS
  Administered 2015-10-22: 0.5 mg via INTRAVENOUS
  Filled 2015-10-19: qty 1
  Filled 2015-10-19 (×3): qty 2
  Filled 2015-10-19: qty 1
  Filled 2015-10-19: qty 2
  Filled 2015-10-19 (×5): qty 1
  Filled 2015-10-19: qty 2

## 2015-10-19 NOTE — Progress Notes (Signed)
Pt stood at side of bed for 3 min- 2 person mod asst.  Assisted back to bed.  B/P  90/60 pulse 118.  Rechecked pulse 10 min later- 112.  Rechecked and was 100.  Pt in mod pain- medicated w/ fent 91mcg IV.  Dr. Johney Maine aware of pt's tachycardia and updated pt's condition

## 2015-10-19 NOTE — Progress Notes (Signed)
[facility]  [address] [place], [place] [address] Phone: [phone] FAX: [phone]   Elizabeth Mullins [id number] 22-Feb-1932  CARE TEAM:  PCP: Idamae Schuller, MD  Outpatient Care Team: Patient Care Team: Loraine Leriche, MD as PCP - General (Internal Medicine) Michael Boston, MD as Consulting Physician (General Surgery)  Inpatient Treatment Team: Treatment Team: Attending Provider: Michael Boston, MD  Problem List:   Principal Problem:   Diverticulitis of colon with perforation Active Problems:   Intra-abdominal abscess ([place])   IBS (irritable bowel syndrome)   Chronic back pain   Anxiety   DD (diverticular disease)   Essential (primary) hypertension   Diverticulitis of large intestine with perforation and abscess without bleeding   1 Day Post-Op  10/18/2015   PROCEDURE:   XI ROBOT ASSISTED LOW ANTERIOR RECTOSIGMOID RESECTION Robotic lysis of adhesions x 58mn Robotic mobilization of the splenic flexure of the colon RIGID PROCTOSCOPY  SURGEON: SMichael Boston MD  ASSISTANT: ALeighton Ruff MD   Assessment  Recovering  Plan:  -adv diet -IVF bolus & follow HR - prob a little dry intravascularly -watch BP -f/u pathology -anxiolysis -VTE prophylaxis- SCDs, etc -mobilize as tolerated to help recovery  I updated the patient's status to the patient and family (daughter).  Also w RNs.  Recommendations were made.  Questions were answered.  They expressed understanding & appreciation.  SAdin Hector M.D., F.A.C.S. Gastrointestinal and Minimally Invasive Surgery [facility] [address] [place] [phone] / Paging   10/19/2015  Subjective:  Stood up Pain controlled w fentanyl/dilaudid alternating Neck sore - heating pad helps Daughter in room   Objective:  Vital signs:  Filed Vitals:   10/18/15 2153 10/18/15 2253 10/19/15 0218  10/19/15 0633  BP: 132/75 1'40/77 94/57 90/60 '  Pulse: 92 92 108 112  Temp: 98.1 F (36.7 C) 98.3 F (36.8 C) 98.7 F (37.1 C) 98.3 F (36.8 C)  TempSrc: Oral Oral Oral Oral  Resp: '16 16 16 16  ' Height:      Weight:      SpO2: 100% 100% 100% 97%    Last BM Date: 10/18/15  Intake/Output   Yesterday:  05/31 0701 - 06/01 0700 In: 4369.6 [I.V.:4269.6; IV Piggyback:100] Out: 1960 [Urine:1250; Drains:410; Blood:300] This shift:     Bowel function:  Flatus: No  BM:  No  Drain: Serosanguinous - thin   Physical Exam:  General: Pt awake/alert/oriented x4 in No acute distress.  Smiling/grateful Eyes: PERRL, normal EOM.  Sclera clear.  No icterus Neuro: CN II-XII intact w/o focal sensory/motor deficits. Lymph: No head/neck/groin lymphadenopathy Psych:  No delerium/psychosis/paranoia HENT: Normocephalic, Mucus membranes moist.  No thrush Neck: Supple, No tracheal deviation Chest: No chest wall pain w good excursion CV:  Pulses intact.  Regular rhythm MS: Normal AROM mjr joints.  No obvious deformity Abdomen: Soft.  Nondistended.  Mildly tender at incisions only.  No evidence of peritonitis.  No incarcerated hernias. Ext:  SCDs BLE.  No mjr edema.  No cyanosis Skin: No petechiae / purpura  Results:   Labs: Results for orders placed or performed during the hospital encounter of 10/18/15 (from the past 48 hour(s))  Basic metabolic panel     Status: Abnormal   Collection Time: 10/19/15  4:11 AM  Result Value Ref Range   Sodium 135 135 - 145 mmol/L   Potassium 3.8 3.5 - 5.1 mmol/L   Chloride 105 101 - 111 mmol/L  CO2 25 22 - 32 mmol/L   Glucose, Bld 147 (H) 65 - 99 mg/dL   BUN 9 6 - 20 mg/dL   Creatinine, Ser 0.68 0.44 - 1.00 mg/dL   Calcium 7.8 (L) 8.9 - 10.3 mg/dL   GFR calc non Af Amer >60 >60 mL/min   GFR calc Af Amer >60 >60 mL/min    Comment: (NOTE) The eGFR has been calculated using the CKD EPI equation. This calculation has not been validated in all clinical  situations. eGFR's persistently <60 mL/min signify possible Chronic Kidney Disease.    Anion gap 5 5 - 15  CBC     Status: Abnormal   Collection Time: 10/19/15  4:11 AM  Result Value Ref Range   WBC 8.8 4.0 - 10.5 K/uL   RBC 2.98 (L) 3.87 - 5.11 MIL/uL   Hemoglobin 9.3 (L) 12.0 - 15.0 g/dL   HCT 27.5 (L) 36.0 - 46.0 %   MCV 92.3 78.0 - 100.0 fL   MCH 31.2 26.0 - 34.0 pg   MCHC 33.8 30.0 - 36.0 g/dL   RDW 15.8 (H) 11.5 - 15.5 %   Platelets 281 150 - 400 K/uL  Magnesium     Status: Abnormal   Collection Time: 10/19/15  4:11 AM  Result Value Ref Range   Magnesium 1.6 (L) 1.7 - 2.4 mg/dL    Imaging / Studies: No results found.  Medications / Allergies: per chart  Antibiotics: Anti-infectives    Start     Dose/Rate Route Frequency Ordered Stop   10/19/15 0200  cefoTEtan (CEFOTAN) 2 g in dextrose 5 % 50 mL IVPB     2 g 100 mL/hr over 30 Minutes Intravenous Every 12 hours 10/18/15 1949 10/19/15 0122   10/18/15 0945  cefoTEtan (CEFOTAN) 2 g in dextrose 5 % 50 mL IVPB     2 g 100 mL/hr over 30 Minutes Intravenous On call to O.R. 10/18/15 0945 10/18/15 1410   10/18/15 0945  clindamycin (CLEOCIN) 900 mg, gentamicin (GARAMYCIN) 240 mg in sodium chloride 0.9 % 1,000 mL for intraperitoneal lavage  Status:  Discontinued    Comments:  Pharmacy may adjust dosing strength, schedule, rate of infusion, etc as needed to optimize therapy    Intraperitoneal To Surgery 10/18/15 0944 10/18/15 1943        Note: Portions of this report may have been transcribed using voice recognition software. Every effort was made to ensure accuracy; however, inadvertent computerized transcription errors may be present.   Any transcriptional errors that result from this process are unintentional.     [name], M.D., F.A.C.S. Gastrointestinal and Minimally Invasive Surgery [facility] [address] [place], [place] [address] [phone] Main /  Paging   10/19/2015

## 2015-10-19 NOTE — Evaluation (Signed)
Occupational Therapy Evaluation Patient Details Name: Elizabeth Mullins MRN: [id number] DOB: 08/03/1931 Today's Date: 10/19/2015    History of Present Illness [name]    Clinical Impression   Pt admitted for abdominal surgery. Pt currently with functional limitations due to the deficits listed below (see OT Problem List). *Pt will benefit from skilled OT to increase their safety and independence with ADL and functional mobility for ADL to facilitate discharge to venue listed below.     Follow Up Recommendations  Home health OT;Supervision/Assistance - 24 hour    Equipment Recommendations  3 in 1 bedside comode;None recommended by OT           Mobility Bed Mobility Overal bed mobility: Needs Assistance Bed Mobility: Supine to Sit     Supine to sit: Max assist;HOB elevated        Transfers Overall transfer level: Needs assistance Equipment used: 1 person hand held assist Transfers: Sit to/from [employer] Transfers Sit to Stand: Mod assist Stand pivot transfers: Mod assist       General transfer comment: Vc for sequencing         ADL Overall ADL's : Needs assistance/impaired     Grooming: Minimal assistance;Sitting   Upper Body Bathing: Sitting   Lower Body Bathing: Total assistance;Sit to/from stand   Upper Body Dressing : Min guard;Sitting   Lower Body Dressing: Total assistance;Sit to/from stand   Toilet Transfer: Moderate assistance;Cueing for safety;BSC   Toileting- Clothing Manipulation and Hygiene: Total assistance;Sit to/from stand         General ADL Comments: pain limiting pt this day               Pertinent Vitals/Pain Pain Assessment: 0-10 Pain Score: 7  Pain Location: abdomen Pain Descriptors / Indicators: Discomfort;Sore Pain Intervention(s): Limited activity within patient's tolerance;Monitored during session;Repositioned;RN gave pain meds during  session;Relaxation     Hand Dominance     Extremity/Trunk Assessment Upper Extremity Assessment Upper Extremity Assessment: Generalized weakness           Communication Communication Communication: No difficulties   Cognition Arousal/Alertness: Awake/alert Behavior During Therapy: WFL for tasks assessed/performed Overall Cognitive Status: Within Functional Limits for tasks assessed                                Home Living Family/patient expects to be discharged to:: Private residence Living Arrangements: Children   Type of Home: House Home Access: Stairs to enter;Level entry     Home Layout: One level     Bathroom Shower/Tub: Teacher, early years/pre: Handicapped height     Home Equipment: Cane - single point          Prior Functioning/Environment Level of Independence: Independent             OT Diagnosis: Generalized weakness;Acute pain   OT Problem List: Decreased strength;Pain;Decreased activity tolerance   OT Treatment/Interventions: Self-care/ADL training;Patient/family education;DME and/or AE instruction    OT Goals(Current goals can be found in the care plan section) Acute Rehab OT Goals Patient Stated Goal: home with daughter OT Goal Formulation: With patient Time For Goal Achievement: 11/02/15 Potential to Achieve Goals: Good  OT Frequency: Min 2X/week   Barriers to D/C:               End of Session Nurse Communication: Mobility status  Activity Tolerance: Patient limited by pain  Patient left: in chair;with call bell/phone within reach;with chair alarm set;with nursing/sitter in room;with family/visitor present   Time: [id number] OT Time Calculation (min): 30 min Charges:  OT General Charges $OT Visit: 1 Procedure OT Evaluation $OT Eval Moderate Complexity: 1 Procedure OT Treatments $Self Care/Home Management : 8-22 mins G-Codes:    Payton Mccallum D 2015/10/27, 11:21 AM

## 2015-10-19 NOTE — Progress Notes (Signed)
PT Cancellation Note  Patient Details Name: Elizabeth Mullins MRN: [id number] DOB: 1931/08/26   Cancelled Treatment:     PT order received this am but eval deferred 2* pt c/o fatigue.  Pt states "I am up in the chair and I think that's enough for right now"  Will follow.   Telena Peyser 10/19/2015, 12:28 PM

## 2015-10-20 LAB — CREATININE, SERUM
Creatinine, Ser: 0.55 mg/dL (ref 0.44–1.00)
GFR calc Af Amer: >60 mL/min (ref >60)
GFR calc non Af Amer: >60 mL/min (ref >60)

## 2015-10-20 LAB — HEMOGLOBIN: Hemoglobin: 8.7 g/dL — ABNORMAL LOW (ref 12.0–15.0)

## 2015-10-20 LAB — POTASSIUM: Potassium: 3.7 mmol/L (ref 3.5–5.1)

## 2015-10-20 LAB — MAGNESIUM: MAGNESIUM: 1.7 mg/dL (ref 1.7–2.4)

## 2015-10-20 MED ORDER — CARISOPRODOL 350 MG PO TABS
350.0000 mg | ORAL_TABLET | Freq: Every day | ORAL | Status: DC
  Administered 2015-10-20 – 2015-10-22 (×3): 350 mg via ORAL
  Filled 2015-10-20 (×3): qty 1

## 2015-10-20 MED ORDER — METHOCARBAMOL 1000 MG/10ML IJ SOLN
500.0000 mg | Freq: Four times a day (QID) | INTRAVENOUS | Status: DC | PRN
  Administered 2015-10-20: 500 mg via INTRAVENOUS
  Filled 2015-10-20: qty 550

## 2015-10-20 MED ORDER — SODIUM CHLORIDE 0.9 % IV SOLN: 250.0000 mL | INTRAVENOUS | Status: DC | PRN

## 2015-10-20 MED ORDER — HYDROCODONE-ACETAMINOPHEN 5-325 MG PO TABS
1.0000 | ORAL_TABLET | ORAL | Status: DC | PRN
  Administered 2015-10-20 (×2): 1 via ORAL
  Administered 2015-10-21 (×2): 2 via ORAL
  Filled 2015-10-20 (×2): qty 1
  Filled 2015-10-20: qty 2
  Filled 2015-10-20: qty 1
  Filled 2015-10-20: qty 2

## 2015-10-20 MED ORDER — SODIUM CHLORIDE 0.9% FLUSH: 3.0000 mL | INTRAVENOUS | Status: DC | PRN

## 2015-10-20 MED ORDER — CARISOPRODOL 350 MG PO TABS: 350.0000 mg | ORAL_TABLET | Freq: Three times a day (TID) | ORAL | Status: DC | PRN

## 2015-10-20 MED ORDER — SODIUM CHLORIDE 0.9% FLUSH
3.0000 mL | Freq: Two times a day (BID) | INTRAVENOUS | Status: DC
  Administered 2015-10-21 – 2015-10-22 (×4): 3 mL via INTRAVENOUS

## 2015-10-20 MED ORDER — METOPROLOL TARTRATE 50 MG PO TABS
50.0000 mg | ORAL_TABLET | Freq: Two times a day (BID) | ORAL | Status: DC
  Administered 2015-10-20: 50 mg via ORAL
  Filled 2015-10-20 (×3): qty 1

## 2015-10-20 MED ORDER — METOPROLOL TARTRATE 12.5 MG HALF TABLET
12.5000 mg | ORAL_TABLET | Freq: Two times a day (BID) | ORAL | Status: DC | PRN
  Filled 2015-10-20: qty 1

## 2015-10-20 MED ORDER — METOPROLOL TARTRATE 25 MG PO TABS
25.0000 mg | ORAL_TABLET | Freq: Two times a day (BID) | ORAL | Status: DC
  Administered 2015-10-20: 25 mg via ORAL
  Filled 2015-10-20 (×2): qty 1

## 2015-10-20 MED ORDER — MAGNESIUM SULFATE 2 GM/50ML IV SOLN
2.0000 g | Freq: Once | INTRAVENOUS | Status: AC
  Administered 2015-10-20: 2 g via INTRAVENOUS
  Filled 2015-10-20: qty 50

## 2015-10-20 MED ORDER — POTASSIUM CHLORIDE 10 MEQ/100ML IV SOLN
10.0000 meq | INTRAVENOUS | Status: AC
  Administered 2015-10-20 (×4): 10 meq via INTRAVENOUS
  Filled 2015-10-20 (×4): qty 100

## 2015-10-20 MED ORDER — ACETAMINOPHEN 500 MG PO TABS
1000.0000 mg | ORAL_TABLET | Freq: Two times a day (BID) | ORAL | Status: DC
  Administered 2015-10-20 – 2015-10-22 (×5): 1000 mg via ORAL
  Filled 2015-10-20 (×7): qty 2

## 2015-10-20 MED ORDER — POTASSIUM CHLORIDE CRYS ER 20 MEQ PO TBCR
40.0000 meq | EXTENDED_RELEASE_TABLET | Freq: Every day | ORAL | Status: AC
  Administered 2015-10-21 – 2015-10-22 (×2): 40 meq via ORAL
  Filled 2015-10-20 (×2): qty 2

## 2015-10-20 NOTE — Evaluation (Signed)
Physical Therapy Evaluation Patient Details Name: Elizabeth Mullins MRN: [id number] DOB: [birth year] Today's Date: 10/20/2015   History of Present Illness  Elizabeth Mullins Elizabeth Mullins Elizabeth Mullins Elizabeth Mullins Elizabeth Mullins Elizabeth Mullins Elizabeth Mullins Elizabeth Mullins Elizabeth Mullins Elizabeth Mullins   Clinical Impression  Pt admitted with above diagnosis. Pt currently with functional limitations due to the deficits listed below (see PT Problem List). * Pt will benefit from skilled PT to increase their independence and safety with mobility to allow discharge to the venue listed below.   Pt did well, initially anxious regarding pain level but able to amb 100' with min assist; encouraged pt to amb with nsg staff as well later today    Follow Up Recommendations Home health PT;No PT follow up (dependign on progress)    Equipment Recommendations  Rolling walker with 5" wheels (may need RW--to be confirmed)    Recommendations for Other Services       Precautions / Restrictions Precautions Precautions: Fall Precaution Comments: JP drain Restrictions Weight Bearing Restrictions: No      Mobility  Bed Mobility Overal bed mobility: Needs Assistance Bed Mobility: Supine to Sit (partial roll to L side)     Supine to sit: HOB elevated;Min assist     General bed mobility comments: assist with trunk, cues for technique, pt uses rail and HOB ~65*  Transfers Overall transfer level: Needs assistance Equipment used: Rolling walker (2 wheeled) Transfers: Sit to/from Stand Sit to Stand: Min assist         General transfer comment: multi-modal cues for hand placement, use of LEs to power up  Ambulation/Gait Ambulation/Gait assistance: Min assist Ambulation Distance (Feet): 100 Feet Assistive device: Rolling walker (2 wheeled) Gait Pattern/deviations: Step-through pattern;Decreased stride length;Trunk flexed     General Gait Details: multi-modal cues for posture, RW poistion and safety; assist to maneuver RW  Stairs            Wheelchair  Mobility    Modified Rankin (Stroke Patients Only)       Balance Overall balance assessment: Needs assistance   Sitting balance-Leahy Scale: Fair       Standing balance-Leahy Scale: Poor Standing balance comment: reliant on UEs for safe standing                             Pertinent Vitals/Pain Pain Assessment: 0-10 Pain Score: 8  (pt is predicting her pain as an * with mobility) Pain Location: abdomen Pain Intervention(s): Limited activity within patient's tolerance;Monitored during session;Premedicated before session;Heat applied    Home Living Family/patient expects to be discharged to:: Private residence Living Arrangements: Children (dtr)   Type of Home: House Home Access: Level entry;Other (comment)   Entrance Stairs-Number of Steps: threshold only Home Layout: One level Home Equipment: Cane - single point      Prior Function Level of Independence: Independent               Hand Dominance        Extremity/Trunk Assessment   Upper Extremity Assessment: Generalized weakness           Lower Extremity Assessment: Generalized weakness         Communication   Communication: No difficulties  Cognition Arousal/Alertness: Awake/alert Behavior During Therapy: WFL for tasks assessed/performed;Anxious Overall Cognitive Status: Within Functional Limits for tasks assessed                      General Comments      Exercises  Assessment/Plan    PT Assessment Patient needs continued PT services  PT Diagnosis Difficulty walking   PT Problem List Decreased activity tolerance;Decreased mobility;Decreased balance;Pain;Decreased knowledge of use of DME  PT Treatment Interventions DME instruction;Gait training;Functional mobility training;Therapeutic activities;Therapeutic exercise;Patient/family education   PT Goals (Current goals can be found in the Care Plan section) Acute Rehab PT Goals Patient Stated Goal: home with  daughter PT Goal Formulation: With patient Time For Goal Achievement: 10/27/15 Potential to Achieve Goals: Good    Frequency Min 3X/week   Barriers to discharge        Co-evaluation               End of Session Equipment Utilized During Treatment: Gait belt Activity Tolerance: Patient tolerated treatment well Patient left: in chair;with call bell/phone within reach;with family/visitor present;with chair alarm set Nurse Communication: Mobility status         Time: [id number] PT Time Calculation (min) (ACUTE ONLY): 18 min   Charges:   PT Evaluation $PT Eval Low Complexity: 1 Procedure     PT G Codes:        Elizabeth Mullins 11/02/15, 11:17 AM

## 2015-10-20 NOTE — Progress Notes (Signed)
[facility]  [address] [place], [place] [address] Phone: [phone] FAX: [phone]   Ayde Record [id number] [birth year]  CARE TEAM:  PCP: Idamae Schuller, MD  Outpatient Care Team: Patient Care Team: Loraine Leriche, MD as PCP - General (Internal Medicine) Michael Boston, MD as Consulting Physician (General Surgery)  Inpatient Treatment Team: Treatment Team: Attending Provider: Michael Boston, MD; Physical Therapist: Neil Crouch, PT  Problem List:   Principal Problem:   Diverticulitis of colon with perforation Active Problems:   IBS (irritable bowel syndrome)   Inflammatory arthropathy ([place])   Chronic back pain   Anxiety   DD (diverticular disease)   Essential (primary) hypertension   Generalized OA   Cochlear hearing loss, bilateral   Diverticulitis of large intestine with perforation and abscess without bleeding   Cervical pain (neck)   2 Days Post-Op  10/18/2015   PROCEDURE:   XI ROBOT ASSISTED LOW ANTERIOR RECTOSIGMOID RESECTION Robotic lysis of adhesions x 11mn Robotic mobilization of the splenic flexure of the colon RIGID PROCTOSCOPY  SURGEON: SMichael Boston MD  ASSISTANT: ALeighton Ruff MD   Assessment  Recovering  Plan:  -adv diet to solids -foley out -IVF PRN bolus & follow HR - prob a little dry intravascularly -watch BP - hold cardura & switch to BID metoprolol -check Hgb -pain control - ice, tylenol BIDgabapentin TID, soma QHS & PRN, IV narcotics =- try PO narcotics (hydrocodone only one she can tolerate) -f/u pathology -anxiolysis -VTE prophylaxis- SCDs, etc -mobilize as tolerated to help recovery  I updated the patient's status to the patient and family (son).   Recommendations were made.  Questions were answered.  They expressed understanding & appreciation.  SAdin Hector M.D., F.A.C.S. Gastrointestinal and Minimally Invasive [facility] [address] [place] [phone] / Paging   10/20/2015  Subjective:  Stood up Sore/tired Son in room  Wanting solids Wanting hydrocodone  Objective:  Vital signs:  Filed Vitals:   10/19/15 1534 10/19/15 1543 10/19/15 2151 10/20/15 0623  BP: 89/60 90/52 114/60 134/62  Pulse:   104 108  Temp:   98.9 F (37.2 C) 98.1 F (36.7 C)  TempSrc:   Oral Oral  Resp:   17 17  Height:      Weight:      SpO2:   100%     Last BM Date: 10/18/15  Intake/Output   Yesterday:  06/01 0701 - 06/02 0700 In: 600 [I.V.:600] Out: 1700 [Urine:1600; Drains:100] This shift:     Bowel function:  Flatus: No  BM:  No  Drain: Serosanguinous - thin   Physical Exam:  General: Pt awake/alert/oriented x4 in No acute distress.  Smiling/grateful Eyes: PERRL, normal EOM.  Sclera clear.  No icterus Neuro: CN II-XII intact w/o focal sensory/motor deficits. Lymph: No head/neck/groin lymphadenopathy Psych:  No delerium/psychosis/paranoia HENT: Normocephalic, Mucus membranes moist.  No thrush Neck: Supple, No tracheal deviation Chest: No chest wall pain w good excursion CV:  Pulses intact.  Regular rhythm MS: Normal AROM mjr joints.  No obvious deformity Abdomen: Soft.  Nondistended.  Mildly tender at incisions only.  No evidence of peritonitis.  No incarcerated hernias. Ext:  SCDs BLE.  No mjr edema.  No cyanosis Skin: No petechiae / purpura  Results:   Labs: Results for orders placed or performed during the hospital encounter of 10/18/15 (from the past 48 hour(s))  Basic metabolic panel  Status: Abnormal   Collection Time: 10/19/15  4:11 AM  Result Value Ref Range   Sodium 135 135 - 145 mmol/L   Potassium 3.8 3.5 - 5.1 mmol/L   Chloride 105 101 - 111 mmol/L   CO2 25 22 - 32 mmol/L   Glucose, Bld 147 (H) 65 - 99 mg/dL   BUN 9 6 - 20 mg/dL   Creatinine, Ser 0.68 0.44 - 1.00 mg/dL   Calcium 7.8 (L) 8.9 - 10.3 mg/dL   GFR calc non Af  Amer >60 >60 mL/min   GFR calc Af Amer >60 >60 mL/min    Comment: (NOTE) The eGFR has been calculated using the CKD EPI equation. This calculation has not been validated in all clinical situations. eGFR's persistently <60 mL/min signify possible Chronic Kidney Disease.    Anion gap 5 5 - 15  CBC     Status: Abnormal   Collection Time: 10/19/15  4:11 AM  Result Value Ref Range   WBC 8.8 4.0 - 10.5 K/uL   RBC 2.98 (L) 3.87 - 5.11 MIL/uL   Hemoglobin 9.3 (L) 12.0 - 15.0 g/dL   HCT 27.5 (L) 36.0 - 46.0 %   MCV 92.3 78.0 - 100.0 fL   MCH 31.2 26.0 - 34.0 pg   MCHC 33.8 30.0 - 36.0 g/dL   RDW 15.8 (H) 11.5 - 15.5 %   Platelets 281 150 - 400 K/uL  Magnesium     Status: Abnormal   Collection Time: 10/19/15  4:11 AM  Result Value Ref Range   Magnesium 1.6 (L) 1.7 - 2.4 mg/dL    Imaging / Studies: No results found.  Medications / Allergies: per chart  Antibiotics: Anti-infectives    Start     Dose/Rate Route Frequency Ordered Stop   10/19/15 0200  cefoTEtan (CEFOTAN) 2 g in dextrose 5 % 50 mL IVPB     2 g 100 mL/hr over 30 Minutes Intravenous Every 12 hours 10/18/15 1949 10/19/15 0122   10/18/15 0945  cefoTEtan (CEFOTAN) 2 g in dextrose 5 % 50 mL IVPB     2 g 100 mL/hr over 30 Minutes Intravenous On call to O.R. 10/18/15 0945 10/18/15 1410   10/18/15 0945  clindamycin (CLEOCIN) 900 mg, gentamicin (GARAMYCIN) 240 mg in sodium chloride 0.9 % 1,000 mL for intraperitoneal lavage  Status:  Discontinued    Comments:  Pharmacy may adjust dosing strength, schedule, rate of infusion, etc as needed to optimize therapy    Intraperitoneal To Surgery 10/18/15 0944 10/18/15 1943        Note: Portions of this report may have been transcribed using voice recognition software. Every effort was made to ensure accuracy; however, inadvertent computerized transcription errors may be present.   Any transcriptional errors that result from this process are unintentional.     [name],  M.D., F.A.C.S. Gastrointestinal and Minimally Invasive Surgery [facility] [address], [place], [place] [address] [phone] Main / Paging   10/20/2015

## 2015-10-21 LAB — POTASSIUM: Potassium: 3.7 mmol/L (ref 3.5–5.1)

## 2015-10-21 LAB — HEMOGLOBIN: HEMOGLOBIN: 8 g/dL — AB (ref 12.0–15.0)

## 2015-10-21 LAB — CREATININE, SERUM
Creatinine, Ser: 0.54 mg/dL (ref 0.44–1.00)
GFR calc Af Amer: >60 mL/min (ref >60)
GFR calc non Af Amer: >60 mL/min (ref >60)

## 2015-10-21 MED ORDER — ATENOLOL 50 MG PO TABS
50.0000 mg | ORAL_TABLET | Freq: Every day | ORAL | Status: DC
  Administered 2015-10-21 – 2015-10-22 (×2): 50 mg via ORAL
  Filled 2015-10-21 (×3): qty 1

## 2015-10-21 NOTE — Progress Notes (Signed)
Patient ID: Elizabeth Mullins, female   DOB: 1931/08/18, 82 y.o.   MRN: [id number]  [facility]  Subjective: POD#3 - patient in bed, son at bedside.  Soft diet for breakfast.  Patient concerned over HR, wants back on atenolol.  Ambulated some.  Passing flatus, no BM.  Intermittent pain in abdomen.  Objective: Vital signs in last 24 hours: Temp:  [97.7 F (36.5 C)-99.1 F (37.3 C)] 97.7 F (36.5 C) (06/03 0558) Pulse Rate:  [98-122] 98 (06/03 0558) Resp:  [16] 16 (06/03 0558) BP: (112-168)/(57-74) 112/60 mmHg (06/03 0558) SpO2:  [95 %-99 %] 95 % (06/03 0558) Last BM Date: 10/18/15  Intake/Output from previous day: 06/02 0701 - 06/03 0700 In: 120 [P.O.:120] Out: 26 [Urine:800; Drains:20] Intake/Output this shift:    Physical Exam: HEENT - sclerae clear, mucous membranes moist Neck - soft Chest - clear bilaterally Cor - RRR Abdomen - soft, mild distension; wound dry and intact; drain with sanguinous output; BS present Ext - no edema, non-tender Neuro - alert & oriented, no focal deficits  Lab Results:   Recent Labs  10/19/15 0411 10/20/15 0851 10/21/15 0400  WBC 8.8  --   --   HGB 9.3* 8.7* 8.0*  HCT 27.5*  --   --   PLT 281  --   --    BMET  Recent Labs  10/19/15 0411 10/20/15 0851 10/21/15 0400  NA 135  --   --   K 3.8 3.7 3.7  CL 105  --   --   CO2 25  --   --   GLUCOSE 147*  --   --   BUN 9  --   --   CREATININE 0.68 0.55 0.54  CALCIUM 7.8*  --   --    PT/INR No results for input(s): LABPROT, INR in the last 72 hours. Comprehensive Metabolic Panel:    Component Value Date/Time   NA 135 10/19/2015 0411   NA 134* 10/04/2015 0815   K 3.7 10/21/2015 0400   K 3.7 10/20/2015 0851   CL 105 10/19/2015 0411   CL 96* 10/04/2015 0815   CO2 25 10/19/2015 0411   CO2 26 10/04/2015 0815   BUN 9 10/19/2015 0411   BUN 12 10/04/2015 0815   CREATININE 0.54 10/21/2015 0400   CREATININE 0.55 10/20/2015 0851   GLUCOSE 147*  10/19/2015 0411   GLUCOSE 111* 10/04/2015 0815   CALCIUM 7.8* 10/19/2015 0411   CALCIUM 9.2 10/04/2015 0815   AST 21 08/25/2015 0546   AST 19 08/24/2015 1008   ALT 19 08/25/2015 0546   ALT 16 08/24/2015 1008   ALKPHOS 47 08/25/2015 0546   ALKPHOS 58 08/24/2015 1008   BILITOT 0.6 08/25/2015 0546   BILITOT 0.4 08/24/2015 1008   PROT 5.9* 08/25/2015 0546   PROT 7.0 08/24/2015 1008   ALBUMIN 2.8* 08/25/2015 0546   ALBUMIN 3.3* 08/24/2015 1008    Studies/Results: No results found.  Assessment & Plans: Status post robotic LAR  Monitor Hgb level - 8.0 today  Discontinue lopressor and restart atenolol at patient and son's request  Encourage po intake  Encourage ambulation  Begin po pain Rx  Earnstine Regal, MD, [facility] Office: [place] 10/21/2015

## 2015-10-22 LAB — CBC
HEMATOCRIT: 24.8 % — AB (ref 36.0–46.0)
HEMOGLOBIN: 8.3 g/dL — AB (ref 12.0–15.0)
MCH: 31 pg (ref 26.0–34.0)
MCHC: 33.5 g/dL (ref 30.0–36.0)
MCV: 92.5 fL (ref 78.0–100.0)
Platelets: 287 10*3/uL (ref 150–400)
RBC: 2.68 MIL/uL — AB (ref 3.87–5.11)
RDW: 15.6 % — ABNORMAL HIGH (ref 11.5–15.5)
WBC: 10.3 10*3/uL (ref 4.0–10.5)

## 2015-10-22 MED ORDER — HYDROMORPHONE HCL 2 MG PO TABS
2.0000 mg | ORAL_TABLET | ORAL | Status: DC | PRN
  Administered 2015-10-22: 2 mg via ORAL
  Administered 2015-10-22: 4 mg via ORAL
  Administered 2015-10-22: 2 mg via ORAL
  Administered 2015-10-22: 4 mg via ORAL
  Administered 2015-10-22: 2 mg via ORAL
  Filled 2015-10-22: qty 1
  Filled 2015-10-22: qty 2
  Filled 2015-10-22: qty 1
  Filled 2015-10-22 (×2): qty 2

## 2015-10-22 NOTE — Progress Notes (Signed)
OT Cancellation Note  Patient Details Name: Elizabeth Mullins MRN: [id number] DOB: November 25, 1931   Cancelled Treatment:     Patient with other staff members, then physician, when OT attempted. Will follow up per OT plan of care.  Nupur Hohman A 10/22/2015, 8:30 AM

## 2015-10-22 NOTE — Progress Notes (Signed)
Patient ID: Elizabeth Mullins, female   DOB: [birth year], 82 y.o.   MRN: [id number]  [facility]  Subjective: POD#4 - patient up in chair, appears comfortable, but still complaining of severe right sided pain.  No nausea or emesis.  Passing flatus, no BM.  Ambulated.  Daughter at bedside with questions.  Objective: Vital signs in last 24 hours: Temp:  [98 F (36.7 C)-98.5 F (36.9 C)] 98 F (36.7 C) (06/04 0518) Pulse Rate:  [96-118] 102 (06/04 0518) Resp:  [14-18] 18 (06/04 0518) BP: (127-167)/(63-85) 167/85 mmHg (06/04 0518) SpO2:  [91 %-95 %] 95 % (06/04 0518) Last BM Date: 10/21/15  Intake/Output from previous day: 06/03 0701 - 06/04 0700 In: 723 [P.O.:717; I.V.:6] Out: 2595 [Urine:2475; Drains:120] Intake/Output this shift:    Physical Exam: HEENT - sclerae clear, mucous membranes moist Neck - soft Abdomen - soft, active BS present; JP drain with serosanguinous output; wound dry and intact with wicks Ext - no edema, non-tender Neuro - alert & oriented, no focal deficits  Lab Results:   Recent Labs  10/21/15 0400 10/22/15 0404  WBC  --  10.3  HGB 8.0* 8.3*  HCT  --  24.8*  PLT  --  287   BMET  Recent Labs  10/20/15 0851 10/21/15 0400  K 3.7 3.7  CREATININE 0.55 0.54   PT/INR No results for input(s): LABPROT, INR in the last 72 hours. Comprehensive Metabolic Panel:    Component Value Date/Time   NA 135 10/19/2015 0411   NA 134* 10/04/2015 0815   K 3.7 10/21/2015 0400   K 3.7 10/20/2015 0851   CL 105 10/19/2015 0411   CL 96* 10/04/2015 0815   CO2 25 10/19/2015 0411   CO2 26 10/04/2015 0815   BUN 9 10/19/2015 0411   BUN 12 10/04/2015 0815   CREATININE 0.54 10/21/2015 0400   CREATININE 0.55 10/20/2015 0851   GLUCOSE 147* 10/19/2015 0411   GLUCOSE 111* 10/04/2015 0815   CALCIUM 7.8* 10/19/2015 0411   CALCIUM 9.2 10/04/2015 0815   AST 21 08/25/2015 0546   AST 19 08/24/2015 1008   ALT 19 08/25/2015 0546   ALT 16  08/24/2015 1008   ALKPHOS 47 08/25/2015 0546   ALKPHOS 58 08/24/2015 1008   BILITOT 0.6 08/25/2015 0546   BILITOT 0.4 08/24/2015 1008   PROT 5.9* 08/25/2015 0546   PROT 7.0 08/24/2015 1008   ALBUMIN 2.8* 08/25/2015 0546   ALBUMIN 3.3* 08/24/2015 1008    Studies/Results: No results found.  Assessment & Plans: Status post robotic LAR Monitor Hgb level - 8.3 today, improved HR 102 Encourage po intake Encourage ambulation Change po pain Rx to Dilaudid tablets - patient states hydrocodone does not work  Earnstine Regal, MD, [facility] Office: [place] 10/22/2015

## 2015-10-23 MED ORDER — HYDROMORPHONE HCL 2 MG PO TABS
2.0000 mg | ORAL_TABLET | ORAL | Status: DC | PRN
  Administered 2015-10-23 (×2): 6 mg via ORAL
  Filled 2015-10-23 (×2): qty 3

## 2015-10-23 MED ORDER — DOXAZOSIN MESYLATE 2 MG PO TABS: 2.0000 mg | ORAL_TABLET | Freq: Every day | ORAL | Status: DC

## 2015-10-23 MED ORDER — ACETAMINOPHEN 500 MG PO TABS
1000.0000 mg | ORAL_TABLET | Freq: Three times a day (TID) | ORAL | Status: DC
  Administered 2015-10-23: 1000 mg via ORAL

## 2015-10-23 MED ORDER — ATENOLOL 50 MG PO TABS
50.0000 mg | ORAL_TABLET | Freq: Every day | ORAL | Status: DC
  Administered 2015-10-23: 50 mg via ORAL
  Filled 2015-10-23: qty 1

## 2015-10-23 MED ORDER — ATENOLOL 100 MG PO TABS: 100.0000 mg | ORAL_TABLET | Freq: Every day | ORAL | Status: DC

## 2015-10-23 MED ORDER — HYDROMORPHONE HCL 4 MG PO TABS: 2.0000 mg | ORAL_TABLET | Freq: Four times a day (QID) | ORAL | Status: DC | PRN

## 2015-10-23 NOTE — Progress Notes (Signed)
Occupational Therapy Treatment Patient Details Name: Sakyra Paris MRN: [id number] DOB: 04-11-1932 Today's Date: 10/23/2015    History of present illness [name]    OT comments  Pt to DC home  Follow Up Recommendations  Home health OT;Supervision/Assistance - 24 hour;Other (comment) (juvenile rolling walker)    Equipment Recommendations  3 in 1 bedside comode;None recommended by OT    Recommendations for Other Services      Precautions / Restrictions Restrictions Weight Bearing Restrictions: No       Mobility Bed Mobility Overal bed mobility: Needs Assistance Bed Mobility: Supine to Sit     Supine to sit: Min assist        Transfers Overall transfer level: Needs assistance Equipment used: Rolling walker (2 wheeled) Transfers: Sit to/from Stand Sit to Stand: Supervision Stand pivot transfers: Supervision                ADL Overall ADL's : Needs assistance/impaired Eating/Feeding: Set up;Sitting   Grooming: Standing;Set up   Upper Body Bathing: Set up;Sitting   Lower Body Bathing: Minimal assistance;Sit to/from stand   Upper Body Dressing : Set up;Sitting   Lower Body Dressing: Minimal assistance;Sit to/from stand;Cueing for sequencing   Toilet Transfer: Minimal assistance;RW;Comfort height toilet;Cueing for safety   Toileting- Clothing Manipulation and Hygiene: Minimal assistance;Cueing for sequencing;Cueing for safety;Sit to/from stand       Functional mobility during ADLs: Min guard General ADL Comments: pt doing well this day. pt going to DC this day                Cognition   Behavior During Therapy: [facility] for tasks assessed/performed Overall Cognitive Status: Within Functional Limits for tasks assessed                              Frequency Min 2X/week     Progress Toward Goals  OT Goals(current goals can now be found in the care plan  section)  Progress towards OT goals: Progressing toward goals  Acute Rehab OT Goals Patient Stated Goal: home with daughter OT Goal Formulation: With patient  Plan Discharge plan remains appropriate       End of Session Equipment Utilized During Treatment: Rolling walker   Activity Tolerance Patient limited by pain   Patient Left in chair;with call bell/phone within reach;with chair alarm set;with nursing/sitter in room;with family/visitor present   Nurse Communication Mobility status        Time: 1010-1042 OT Time Calculation (min): 32 min  Charges: OT General Charges $OT Visit: 1 Procedure OT Treatments $Self Care/Home Management : 23-37 mins  Maurisio Ruddy D 10/23/2015, 10:57 AM

## 2015-10-23 NOTE — Progress Notes (Signed)
RN reviewed discharge instructions with patient and family. All questions answered.   Paperwork and prescriptions given.   NT rolled patient down with all belongings to family car. 

## 2015-10-23 NOTE — Progress Notes (Signed)
As day nurse and I  were finishing up bedside reporting, pt told me she had to use the bathroom.  I assured pt that I would let NT know because I was in the middle of report.  I did not see NT right away so I told sec to find NT and let her know pt had to toilet.  After I had finished report on my other 3 pts, I noticed that pt's call light was on.  I entered her room, and pt was on the [facility].  I thought my tech had helped pt to toilet, so I asked pt if she was ready to get up.  At that point, the pt's daughter said " I had to put on the [facility] and family shouldn't to do that." I agreed with daughter and apologized for staff not responding quickly enough.  Pt stated "that she could not wait for NT to help her because she had to go in a hurry." On a side note, at the beginning of shift, pt was asking to use BSC every 30 min to 1 hour.  I bladder scanned to see if she was retaining urine, but it only showed 190 cc urine.  As the night progressed, pt needed to toilet less and less.  Will continue to monitor.

## 2015-10-23 NOTE — Discharge Summary (Addendum)
Physician Discharge Summary  Patient ID: Margel Joens MRN: [id number] DOB/AGE: [birth year] 80 y.o.  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[place])   Chronic back pain   Anxiety   DD (diverticular disease)   Essential (primary) hypertension   Generalized OA   Cochlear hearing loss, bilateral   Diverticulitis of large intestine with perforation and abscess without bleeding   Cervical pain (neck)   Discharge Diagnoses:  Principal Problem:   Diverticulitis of colon with perforation Active Problems:   IBS (irritable bowel syndrome)   Inflammatory arthropathy ([facility])   Chronic back pain   Anxiety   DD (diverticular disease)   Essential (primary) hypertension   Generalized OA   Cochlear hearing loss, bilateral   Diverticulitis of large intestine with perforation and abscess without bleeding   Cervical pain (neck)   5 Days Post-Op 10/18/2015   PROCEDURE:   XI ROBOT ASSISTED LOW ANTERIOR RECTOSIGMOID RESECTION Robotic lysis of adhesions x 52mn Robotic mobilization of the splenic flexure of the colon RIGID PROCTOSCOPY  SURGEON: SMichael Boston MD  ASSISTANT: ALeighton Ruff MD  Diagnosis  1. Colon, segmental resection - DIVERTICULITIS WITH PERFORATION AND PERICOLONIC ABSCESS. - NO EVIDENCE OF MALIGNANCY. 2. Colon, resection margin (donut), anastomosis proxmial - UNREMARKABLE COLON. - NO INFLAMMATION OR MALIGNANCY. 3. Colon, resection margin (donut), anastomosis distal end - UNREMARKABLE COLON. - NO INFLAMMATION OR MALIGNANCY. JClaudette LawsMD Pathologist, Electronic Signature (Case signed 10/20/2015)  Consults: None  Hospital Course:   The patient  underwent  the surgery above.  Postoperatively, the patient gradually mobilized and advanced to a solid diet.  Pain and other symptoms were treated aggressively.  Weaned off oxygen.  HR controlled with home atenolol.  Pain control a challenge in this chronic pain pt - better w PO Dilaudid.    By the time of discharge, the patient was walking well the hallways, eating food, having flatus & BMs.  Pain was well-controlled on an oral medications.  Dressings off, wicks removed, drain removed.  Based on meeting discharge criteria and continuing to recover, I felt it was safe for the patient to be discharged from the hospital to further recover with close followup. PT thought HKon[facility]raPT OK.  I agree.  Postoperative recommendations were discussed in detail to pt, son, and RTherapist, sports  Continue atenolol.  Hold doxazosin x 1 week, then restart.  They are written as well.   Significant Diagnostic Studies:  Results for orders placed or performed during the hospital encounter of 10/18/15 (from the past 72 hour(s))  Hemoglobin     Status: Abnormal   Collection Time: 10/20/15  8:51 AM  Result Value Ref Range   Hemoglobin 8.7 (L) 12.0 - 15.0 g/dL  Potassium     Status: None   Collection Time: 10/20/15  8:51 AM  Result Value Ref Range   Potassium 3.7 3.5 - 5.1 mmol/L  Creatinine, serum     Status: None   Collection Time: 10/20/15  8:51 AM  Result Value Ref Range   Creatinine, Ser 0.55 0.44 - 1.00 mg/dL   GFR calc non Af Amer >60 >60 mL/min   GFR calc Af Amer >60 >60 mL/min    Comment: (NOTE) The eGFR has been calculated using the CKD EPI equation.  This calculation has not been validated in all clinical situations. eGFR's persistently <60 mL/min signify possible Chronic Kidney Disease.   Magnesium     Status: None   Collection Time: 10/20/15  8:51 AM  Result Value Ref Range   Magnesium 1.7 1.7 - 2.4 mg/dL  Hemoglobin     Status: Abnormal   Collection Time: 10/21/15  4:00 AM  Result Value Ref Range   Hemoglobin  8.0 (L) 12.0 - 15.0 g/dL  Potassium     Status: None   Collection Time: 10/21/15  4:00 AM  Result Value Ref Range   Potassium 3.7 3.5 - 5.1 mmol/L  Creatinine, serum     Status: None   Collection Time: 10/21/15  4:00 AM  Result Value Ref Range   Creatinine, Ser 0.54 0.44 - 1.00 mg/dL   GFR calc non Af Amer >60 >60 mL/min   GFR calc Af Amer >60 >60 mL/min    Comment: (NOTE) The eGFR has been calculated using the CKD EPI equation. This calculation has not been validated in all clinical situations. eGFR's persistently <60 mL/min signify possible Chronic Kidney Disease.   CBC     Status: Abnormal   Collection Time: 10/22/15  4:04 AM  Result Value Ref Range   WBC 10.3 4.0 - 10.5 K/uL   RBC 2.68 (L) 3.87 - 5.11 MIL/uL   Hemoglobin 8.3 (L) 12.0 - 15.0 g/dL   HCT 24.8 (L) 36.0 - 46.0 %   MCV 92.5 78.0 - 100.0 fL   MCH 31.0 26.0 - 34.0 pg   MCHC 33.5 30.0 - 36.0 g/dL   RDW 15.6 (H) 11.5 - 15.5 %   Platelets 287 150 - 400 K/uL    No results found.  Discharge Exam: Blood pressure 137/73, pulse 106, temperature 97.8 F (36.6 C), temperature source Oral, resp. rate 18, height '4\' 11"'  (1.499 m), weight 65.772 kg (145 lb), SpO2 99 %.  General: Pt awake/alert/oriented x4 in No acute distress.  Smiling/grateful.  Gave me a hug Eyes: PERRL, normal EOM.  Sclera clear.  No icterus Neuro: CN II-XII intact w/o focal sensory/motor deficits. Lymph: No head/neck/groin lymphadenopathy Psych:  No delerium/psychosis/paranoia HENT: Normocephalic, Mucus membranes moist.  No thrush Neck: Supple, No tracheal deviation Chest: No chest wall pain w good excursion CV:  Pulses intact.  Regular rhythm MS: Normal AROM mjr joints.  No obvious deformity Abdomen: Soft.  Nondistended.  Mildly tender at incisions only.  No evidence of peritonitis.  No incarcerated hernias. Ext:  SCDs BLE.  No mjr edema.  No cyanosis Skin: No petechiae / purpura  Discharged Condition: fair but improving   Past Medical  History  Diagnosis Date  . Diverticulitis   . Hypertension   . Scoliosis   . Allergic headache   . Arthritis     "all over"  . Chronic back pain     "top of my neck to bottom of my feet" (08/25/2015)  . H/O drainage of abscess     left buttocks area -closed drainage bag in place 10-12-15   . Impaired mobility     "cervical neck- range of motion limitations"    Past Surgical History  Procedure Laterality Date  . Appendectomy  1960s  . Tonsillectomy    . Total knee arthroplasty Right   . Ct guide abscess drainage  ([facility] hx)  08/25/2015    pelvic diverticular abscess  . Joint replacement    . Vaginal hysterectomy  1973    "partial"  . Dilation  and curettage of uterus      Social History   Social History  . Marital Status: Widowed    Spouse Name: N/A  . Number of Children: N/A  . Years of Education: N/A   Occupational History  . Not on file.   Social History Main Topics  . Smoking status: Never Smoker   . Smokeless tobacco: Never Used  . Alcohol Use: No  . Drug Use: No  . Sexual Activity: No   Other Topics Concern  . Not on file   Social History Narrative    History reviewed. No pertinent family history.  Current Facility-Administered Medications  Medication Dose Route Frequency Provider Last Rate Last Dose  . 0.9 %  sodium chloride infusion  250 mL Intravenous PRN Michael Boston, MD      . acetaminophen (TYLENOL) tablet 1,000 mg  1,000 mg Oral TID Michael Boston, MD      . ALPRAZolam Duanne Moron) tablet 0.5 mg  0.5 mg Oral TID PRN Michael Boston, MD   0.5 mg at 10/23/15 0120  . alum & mag hydroxide-simeth (MAALOX/MYLANTA) 200-200-20 MG/5ML suspension 30 mL  30 mL Oral Q6H PRN Michael Boston, MD      . atenolol (TENORMIN) tablet 50 mg  50 mg Oral Daily Michael Boston, MD      . carisoprodol (SOMA) tablet 350 mg  350 mg Oral QHS Michael Boston, MD   350 mg at 10/22/15 2200  . carisoprodol (SOMA) tablet 350 mg  350 mg Oral TID PRN Michael Boston, MD      . diphenhydrAMINE (BENADRYL)  12.5 MG/5ML elixir 12.5 mg  12.5 mg Oral Q6H PRN Michael Boston, MD       Or  . diphenhydrAMINE (BENADRYL) injection 12.5 mg  12.5 mg Intravenous Q6H PRN Michael Boston, MD      . enoxaparin (LOVENOX) injection 40 mg  40 mg Subcutaneous Q24H Michael Boston, MD   40 mg at 10/22/15 0843  . feeding supplement (ENSURE ENLIVE) (ENSURE ENLIVE) liquid 237 mL  237 mL Oral BID BM Michael Boston, MD   237 mL at 10/22/15 1424  . gabapentin (NEURONTIN) capsule 800 mg  800 mg Oral TID Michael Boston, MD   800 mg at 10/22/15 2158  . guaiFENesin-dextromethorphan (ROBITUSSIN DM) 100-10 MG/5ML syrup 15 mL  15 mL Oral Q4H PRN Michael Boston, MD      . HYDROmorphone (DILAUDID) injection 0.5-2 mg  0.5-2 mg Intravenous Q2H PRN Michael Boston, MD   1 mg at 10/22/15 6712  . HYDROmorphone (DILAUDID) tablet 2-6 mg  2-6 mg Oral Q4H PRN Michael Boston, MD   6 mg at 10/23/15 0809  . lip balm (CARMEX) ointment 1 application  1 application Topical BID Michael Boston, MD   1 application at 45/80/99 1021  . magic mouthwash  15 mL Oral QID PRN Michael Boston, MD      . menthol-cetylpyridinium (CEPACOL) lozenge 3 mg  1 lozenge Oral PRN Michael Boston, MD      . multivitamins with iron tablet 1 tablet  1 tablet Oral Daily Michael Boston, MD   1 tablet at 10/22/15 1019  . ondansetron (ZOFRAN-ODT) disintegrating tablet 4 mg  4 mg Oral TID PRN Michael Boston, MD   4 mg at 10/22/15 1628  . phenol (CHLORASEPTIC) mouth spray 2 spray  2 spray Mouth/Throat PRN Michael Boston, MD      . potassium chloride SA (K-DUR,KLOR-CON) CR tablet 40 mEq  40 mEq Oral Daily Michael Boston, MD   40  mEq at 10/22/15 1020  . prochlorperazine (COMPAZINE) injection 10 mg  10 mg Intravenous Q6H PRN Michael Boston, MD   10 mg at 10/19/15 0034  . raloxifene (EVISTA) tablet 60 mg  60 mg Oral Daily Michael Boston, MD   60 mg at 10/22/15 1022  . saccharomyces boulardii (FLORASTOR) capsule 250 mg  250 mg Oral BID Michael Boston, MD   250 mg at 10/22/15 2158  . sodium chloride flush (NS) 0.9 % injection  3 mL  3 mL Intravenous Q12H Michael Boston, MD   3 mL at 10/22/15 2200  . sodium chloride flush (NS) 0.9 % injection 3 mL  3 mL Intravenous PRN Michael Boston, MD      . thiamine (VITAMIN B-1) tablet 100 mg  100 mg Oral Daily Michael Boston, MD   100 mg at 10/22/15 1019  . vitamin E capsule 400 Units  400 Units Oral Daily Michael Boston, MD   400 Units at 10/22/15 1018  . zolpidem (AMBIEN) tablet 5 mg  5 mg Oral QHS PRN Michael Boston, MD   5 mg at 10/18/15 2252     Allergies  Allergen Reactions  . Codeine Nausea And Vomiting and Other (See Comments)    Not a true allergy she needs nausea medication if given this medication.  . Demerol [Meperidine] Nausea And Vomiting and Other (See Comments)    Not a true allergy she needs nausea medication if given this medication.   . Morphine And Related Nausea And Vomiting and Other (See Comments)    Not a true allergy she needs nausea medication if given this medication.   . Nsaids Nausea And Vomiting and Other (See Comments)    Not a true allergy she needs nausea medication if given this medication.   . Oxycodone Other (See Comments)    "Doesn't do anything for her"    Disposition: 06-Home-Health Care Svc  Discharge Instructions    Call MD for:  extreme fatigue    Complete by:  As directed      Call MD for:  extreme fatigue    Complete by:  As directed      Call MD for:  hives    Complete by:  As directed      Call MD for:  hives    Complete by:  As directed      Call MD for:  persistant nausea and vomiting    Complete by:  As directed      Call MD for:  persistant nausea and vomiting    Complete by:  As directed      Call MD for:  redness, tenderness, or signs of infection (pain, swelling, redness, odor or green/yellow discharge around incision site)    Complete by:  As directed      Call MD for:  redness, tenderness, or signs of infection (pain, swelling, redness, odor or green/yellow discharge around incision site)    Complete by:  As  directed      Call MD for:  severe uncontrolled pain    Complete by:  As directed      Call MD for:  severe uncontrolled pain    Complete by:  As directed      Call MD for:    Complete by:  As directed   Temperature > 101.32F     Call MD for:    Complete by:  As directed   Temperature > 101.32F     Diet - low sodium heart healthy  Complete by:  As directed   Start with bland, low residue diet for a few days, then advance to a heart healthy (low fat, high fiber) diet.  If you feel nauseated or constipated, simplify to a liquid only diet for 48 hours until you are feeling better (no more nausea, farting/passing gas, having a bowel movement, etc...).  If you cannot tolerate even drinking liquids, or feeling worse, let your surgeon know or go to the Emergency Department for help.     Diet - low sodium heart healthy    Complete by:  As directed   Start with bland, low residue diet for a few days, then advance to a heart healthy (low fat, high fiber) diet.  If you feel nauseated or constipated, simplify to a liquid only diet for 48 hours until you are feeling better (no more nausea, farting/passing gas, having a bowel movement, etc...).  If you cannot tolerate even drinking liquids, or feeling worse, let your surgeon know or go to the Emergency Department for help.     Discharge instructions    Complete by:  As directed   Please see discharge instruction sheets.   Also refer to any handouts/printouts that may have been given from the [facility] surgery office (if you visited [place] there before surgery) Please call our office if you have any questions or concerns [phone]     Discharge instructions    Complete by:  As directed   Please see discharge instruction sheets.   Also refer to any handouts/printouts that may have been given from the [facility] surgery office (if you visited [place] there before surgery) Please call our office if you have any questions or concerns [phone]     Discharge wound care:     Complete by:  As directed   If you have closed incisions: Shower and bathe over these incisions with soap and water every day.  It is OK to wash over the dressings: they are waterproof. Remove all surgical dressings on postoperative day #3.  You do not need to replace dressings over the closed incisions unless you feel more comfortable with a Band-Aid covering it.   If you have an open wound: That requires packing, so please see wound care instructions.   In general, remove all dressings, wash wound with soap and water and then replace with saline moistened gauze.  Do the dressing change at least every day.    Please call our office [phone] if you have further questions.     Discharge wound care:    Complete by:  As directed   If you have closed incisions: Shower and bathe over these incisions with soap and water every day.  It is OK to wash over the dressings: they are waterproof. Remove all surgical dressings on postoperative day #3.  You do not need to replace dressings over the closed incisions unless you feel more comfortable with a Band-Aid covering it.   If you have an open wound: That requires packing, so please see wound care instructions.   In general, remove all dressings, wash wound with soap and water and then replace with saline moistened gauze.  Do the dressing change at least every day.    Please call our office [phone] if you have further questions.     Driving Restrictions    Complete by:  As directed   No driving until off narcotics and can safely swerve away without pain during an emergency     Driving  Restrictions    Complete by:  As directed   No driving until off narcotics and can safely swerve away without pain during an emergency     Increase activity slowly    Complete by:  As directed   Walk an hour a day.  Use 20-30 minute walks.  When you can walk 30 minutes without difficulty, it is fine to restart low impact/moderate activities such as biking,  jogging, swimming, sexual activity, etc.  Eventually you can increase to unrestricted activity when not feeling pain.  If you feel pain: STOP!Marland Kitchen   Let pain protect you from overdoing it.  Use ice/heat & over-the-counter pain medications to help minimize soreness.  If that is not enough, then use your narcotic pain prescription as needed to remain active.  It is better to take extra pain medications and be more active than to stay bedridden to avoid all pain medications.     Increase activity slowly    Complete by:  As directed   Walk an hour a day.  Use 20-30 minute walks.  When you can walk 30 minutes without difficulty, it is fine to restart low impact/moderate activities such as biking, jogging, swimming, sexual activity, etc.  Eventually you can increase to unrestricted activity when not feeling pain.  If you feel pain: STOP!Marland Kitchen   Let pain protect you from overdoing it.  Use ice/heat & over-the-counter pain medications to help minimize soreness.  If that is not enough, then use your narcotic pain prescription as needed to remain active.  It is better to take extra pain medications and be more active than to stay bedridden to avoid all pain medications.     Lifting restrictions    Complete by:  As directed   Avoid heavy lifting initially, <20 pounds at first.   Do not push through pain.   You have no specific weight limit: If it hurts to do, DON'T DO IT.    If you feel no pain, you are not injuring anything.  Pain will protect you from injury.   Coughing and sneezing are far more stressful to your incision than any lifting.   Avoid resuming heavy lifting (>50 pounds) or other intense activity until off all narcotic pain medications.   When want to exercise more, give yourself 2 weeks to gradually get back to full intense exercise/activity.     Lifting restrictions    Complete by:  As directed   Avoid heavy lifting initially, <20 pounds at first.   Do not push through pain.   You have no specific  weight limit: If it hurts to do, DON'T DO IT.    If you feel no pain, you are not injuring anything.  Pain will protect you from injury.   Coughing and sneezing are far more stressful to your incision than any lifting.   Avoid resuming heavy lifting (>50 pounds) or other intense activity until off all narcotic pain medications.   When want to exercise more, give yourself 2 weeks to gradually get back to full intense exercise/activity.     May shower / Bathe    Complete by:  As directed   [place].  It is fine for dressings or wounds to be washed/rinsed.  Use gentle soap & water.  This will help the incisions and/or wounds get clean & minimize infection.     May shower / Bathe    Complete by:  As directed   [place].  It is fine  for dressings or wounds to be washed/rinsed.  Use gentle soap & water.  This will help the incisions and/or wounds get clean & minimize infection.     May walk up steps    Complete by:  As directed      May walk up steps    Complete by:  As directed      Sexual Activity Restrictions    Complete by:  As directed   Sexual activity as tolerated.  Do not push through pain.  Pain will protect you from injury.     Sexual Activity Restrictions    Complete by:  As directed   Sexual activity as tolerated.  Do not push through pain.  Pain will protect you from injury.     Walk with assistance    Complete by:  As directed   Walk over an hour a day.  May use a walker/cane/companion to help with balance and stamina.     Walk with assistance    Complete by:  As directed   Walk over an hour a day.  May use a walker/cane/companion to help with balance and stamina.            Medication List    STOP taking these medications        ciprofloxacin 500 MG tablet  Commonly known as:  CIPRO     Fish Oil 1000 MG Caps     HYDROcodone-acetaminophen 5-325 MG tablet  Commonly known as:  NORCO/VICODIN     metroNIDAZOLE 500 MG tablet  Commonly known as:  FLAGYL      neomycin 500 MG tablet  Commonly known as:  MYCIFRADIN     Red Yeast Rice 600 MG Caps      TAKE these medications        ALPRAZolam 0.5 MG tablet  Commonly known as:  XANAX  Take 0.5 mg by mouth 3 (three) times daily as needed for anxiety.     atenolol 50 MG tablet  Commonly known as:  TENORMIN  Take 50 mg by mouth daily.     carisoprodol 350 MG tablet  Commonly known as:  SOMA  Take 350 mg by mouth 4 (four) times daily as needed for muscle spasms.     doxazosin 2 MG tablet  Commonly known as:  CARDURA  Take 1 tablet (2 mg total) by mouth daily.  Start taking on:  10/30/2015     gabapentin 800 MG tablet  Commonly known as:  NEURONTIN  Take 800 mg by mouth 3 (three) times daily.     HYDROmorphone 4 MG tablet  Commonly known as:  DILAUDID  Take 0.5-2 tablets (2-8 mg total) by mouth every 6 (six) hours as needed for moderate pain or severe pain.     hyoscyamine 0.125 MG Tbdp disintergrating tablet  Commonly known as:  ANASPAZ  Place 0.125 mg under the tongue 3 (three) times daily as needed for cramping.     ondansetron 4 MG disintegrating tablet  Commonly known as:  ZOFRAN-ODT  Take 4 mg by mouth 3 (three) times daily as needed for nausea or vomiting.     raloxifene 60 MG tablet  Commonly known as:  EVISTA  Take 60 mg by mouth daily.     thiamine 100 MG tablet  Commonly known as:  VITAMIN B-1  Take 100 mg by mouth daily.     vitamin E 400 UNIT capsule  Generic drug:  vitamin E  Take 400 Units by mouth daily.  Follow-up Information    Follow up with Jodelle Fausto C., MD. Schedule an appointment as soon as possible for a visit in 2 weeks.   Specialty:  General Surgery   Why:  To follow up after your operation, To follow up after your hospital stay   Contact information:   [address] [place] [address] [phone]        Signed: Morton Peters, M.D., F.A.C.S. Gastrointestinal and Minimally Invasive  Surgery [facility] [address] [place], [place] [address] [phone] [address] / Paging   10/23/2015, 8:15 AM

## 2015-10-23 NOTE — Discharge Instructions (Signed)
HOLD Cardura (doxazosin) for one week, then restarted next Monday, 10/30/2015.  SURGERY: POST OP INSTRUCTIONS (Surgery for small bowel obstruction, colon resection, etc)    DIET Follow a light diet the first few days at home.  Start with a bland diet such as soups, liquids, starchy foods, low fat foods, etc.  If you feel full, bloated, or constipated, stay on a ful liquid or pureed/blenderized diet for a few days until you feel better and no longer constipated. Be sure to drink plenty of fluids every day to avoid getting dehydrated (feeling dizzy, not urinating, etc.). Gradually add a fiber supplement to your diet over the next week.  Gradually get back to a regular solid diet.  Avoid fast food or heavy meals the first week as you are more likely to get nauseated. It is expected for your digestive tract to need a few months to get back to normal.  It is common for your bowel movements and stools to be irregular.  You will have occasional bloating and cramping that should eventually fade away.  Until you are eating solid food normally, off all pain medications, and back to regular activities; your bowels will not be normal. Focus on eating a low-fat, high fiber diet the rest of your life (See Getting to [place], below).  CARE of your INCISION or WOUND It is good for closed incision and even open wounds to be washed every day.  Shower every day.  Short baths are fine.  Wash the incisions and wounds clean with soap & water.    If you have a closed incision(s), wash the incision with soap & water every day.  You may leave closed incisions open to air if it is dry.   You may cover the incision with clean gauze & replace it after your daily shower for comfort. If you have skin tapes (Steristrips) or skin glue (Dermabond) on your incision, leave them in place.  They will fall off on their own like a scab.  You may trim any edges that curl up with clean scissors.  If you have staples, set up an  appointment for them to be removed in the office in 10 days after surgery.  If you have a drain, wash around the skin exit site with soap & water and place a new dressing of gauze or band aid around the skin every day.  Keep the drain site clean & dry.    If you have an open wound with packing, see wound care instructions.  In general, it is encouraged that you remove your dressing and packing, shower with soap & water, and replace your dressing once a day.  Pack the wound with clean gauze moistened with normal (0.9%) saline to keep the wound moist & uninfected.  Pressure on the dressing for 30 minutes will stop most wound bleeding.  Eventually your body will heal & pull the open wound closed over the next few months.  Raw open wounds will occasionally bleed or secrete yellow drainage until it heals closed.  Drain sites will drain a little until the drain is removed.  Even closed incisions can have mild bleeding or drainage the first few days until the skin edges scab over & seal.   If you have an open wound with a wound vac, see wound vac care instructions.     ACTIVITIES as tolerated Start light daily activities --- self-care, walking, climbing stairs-- beginning the day after surgery.  Gradually increase activities as  tolerated.  Control your pain to be active.  Stop when you are tired.  Ideally, walk several times a day, eventually an hour a day.   Most people are back to most day-to-day activities in a few weeks.  It takes 4-8 weeks to get back to unrestricted, intense activity. If you can walk 30 minutes without difficulty, it is safe to try more intense activity such as jogging, treadmill, bicycling, low-impact aerobics, swimming, etc. Save the most intensive and strenuous activity for last (Usually 4-8 weeks after surgery) such as sit-ups, heavy lifting, contact sports, etc.  Refrain from any intense heavy lifting or straining until you are off narcotics for pain control.  You will have off  days, but things should improve week-by-week. DO NOT PUSH THROUGH PAIN.  Let pain be your guide: If it hurts to do something, don't do it.  Pain is your body warning you to avoid that activity for another week until the pain goes down. You may drive when you are no longer taking narcotic prescription pain medication, you can comfortably wear a seatbelt, and you can safely make sudden turns/stops to protect yourself without hesitating due to pain. You may have sexual intercourse when it is comfortable. If it hurts to do something, stop.  MEDICATIONS Take your usually prescribed home medications unless otherwise directed.   Blood thinners:  Usually you can restart any strong blood thinners after the second postoperative day.  It is OK to take aspirin right away.     If you are on strong blood thinners (warfarin/Coumadin, Plavix, Xerelto, Eliquis, Pradaxa, etc), discuss with your surgeon, medicine PCP, and/or cardiologist for instructions on when to restart the blood thinner & if blood monitoring is needed (PT/INR blood check, etc).     PAIN CONTROL Pain after surgery or related to activity is often due to strain/injury to muscle, tendon, nerves and/or incisions.  This pain is usually short-term and will improve in a few months.  To help speed the process of healing and to get back to regular activity more quickly, DO THE FOLLOWING THINGS TOGETHER: 1. Increase activity gradually.  DO NOT PUSH THROUGH PAIN 2. Use Ice and/or Heat 3. Try Gentle Massage and/or Stretching 4. Take over the counter pain medication 5. Take Narcotic prescription pain medication for more severe pain  Good pain control = faster recovery.  It is better to take more medicine to be more active than to stay in bed all day to avoid medications. 1.  Increase activity gradually Avoid heavy lifting at first, then increase to lifting as tolerated over the next 6 weeks. Do not push through the pain.  Listen to your body and avoid  positions and maneuvers than reproduce the pain.  Wait a few days before trying something more intense Walking an hour a day is encouraged to help your body recover faster and more safely.  Start slowly and stop when getting sore.  If you can walk 30 minutes without stopping or pain, you can try more intense activity (running, jogging, aerobics, cycling, swimming, treadmill, sex, sports, weightlifting, etc.) Remember: If it hurts to do it, then dont do it! 2. Use Ice and/or Heat You will have swelling and bruising around the incisions.  This will take several weeks to resolve. Ice packs or heating pads (6-8 times a day, 30-60 minutes at a time) will help sooth soreness & bruising. Some people prefer to use ice alone, heat alone, or alternate between ice & heat.  Experiment and see  what works best for you.  Consider trying ice for the first few days to help decrease swelling and bruising; then, switch to heat to help relax sore spots and speed recovery. Shower every day.  Short baths are fine.  It feels good!  Keep the incisions and wounds clean with soap & water.   3. Try Gentle Massage and/or Stretching Massage at the area of pain many times a day Stop if you feel pain - do not overdo it 4. Take over the counter pain medication This helps the muscle and nerve tissues become less irritable and calm down faster Choose ONE of the following over-the-counter anti-inflammatory medications: Acetaminophen 500mg  tabs (Tylenol) 1-2 pills with every meal and just before bedtime (avoid if you have liver problems or if you have acetaminophen in you narcotic prescription) Naproxen 220mg  tabs (ex. Aleve, Naprosyn) 1-2 pills twice a day (avoid if you have kidney, stomach, IBD, or bleeding problems) Ibuprofen 200mg  tabs (ex. Advil, Motrin) 3-4 pills with every meal and just before bedtime (avoid if you have kidney, stomach, IBD, or bleeding problems) Take with food/snack several times a day as directed for at  least 2 weeks to help keep pain / soreness down & more manageable. 5. Take Narcotic prescription pain medication for more severe pain A prescription for strong pain control is often given to you upon discharge (for example: oxycodone/Percocet, hydrocodone/Norco/Vicodin, or tramadol/Ultram) Take your pain medication as prescribed. Be mindful that most narcotic prescriptions contain Tylenol (acetaminophen) as well - avoid taking too much Tylenol. If you are having problems/concerns with the prescription medicine (does not control pain, nausea, vomiting, rash, itching, etc.), please call us [phone] to see if we need to switch you to a different pain medicine that will work better for you and/or control your side effects better. If you need a refill on your pain medication, you must call the office before 4 pm and on weekdays only.  By federal law, prescriptions for narcotics cannot be called into a pharmacy.  They must be filled out on paper & picked up from our office by the patient or authorized caretaker.  Prescriptions cannot be filled after 4 pm nor on weekends.   WHEN TO CALL us [phone] Severe uncontrolled or worsening pain  Fever over 101 F (38.5 C) Concerns with the incision: Worsening pain, redness, rash/hives, swelling, bleeding, or drainage Reactions / problems with new medications (itching, rash, hives, nausea, etc.) Nausea and/or vomiting Difficulty urinating Difficulty breathing Worsening fatigue, dizziness, lightheadedness, blurred vision Other concerns If you are not getting better after two weeks or are noticing you are getting worse, contact our office [phone] for further advice.  We may need to adjust your medications, re-evaluate you in the office, send you to the emergency room, or see what other things we can do to help. The clinic staff is available to answer your questions during regular business hours (8:30am-5pm).  Please dont hesitate to call and ask to  speak to one of our nurses for clinical concerns.    A surgeon from [facility] is always on call at the hospitals 24 hours/day If you have a medical emergency, go to the nearest emergency room or call 911. FOLLOW UP in our office One the day of your discharge from the hospital (or the next business weekday), please call [facility] to set up or confirm an appointment to see your surgeon in the office for a follow-up appointment.  Usually it is 2-3  weeks after your surgery.   If you have skin staples at your incision(s), let the office know so we can set up a time in the office for the nurse to remove them (usually around 10 days after surgery). Make sure that you call for appointments the day of discharge (or the next business weekday) from the hospital to ensure a convenient appointment time. IF YOU HAVE DISABILITY OR FAMILY LEAVE FORMS, BRING THEM TO THE OFFICE FOR PROCESSING.  DO NOT GIVE THEM TO YOUR DOCTOR.  [facility] [address] [place], [place], [place]  [address] ? [phone] - Main [phone] - [place],  [phone] - Fax [email or web address]  GETTING TO GOOD BOWEL HEALTH. It is expected for your digestive tract to need a few months to get back to normal.  It is common for your bowel movements and stools to be irregular.  You will have occasional bloating and cramping that should eventually fade away.  Until you are eating solid food normally, off all pain medications, and back to regular activities; your bowels will not be normal.   Avoiding constipation The goal: ONE SOFT BOWEL MOVEMENT A DAY!    Drink plenty of fluids.  Choose water first. TAKE A FIBER SUPPLEMENT EVERY DAY THE REST OF YOUR LIFE During your first week back home, gradually add back a fiber supplement every day Experiment which form you can tolerate.   There are many forms such as powders, tablets, wafers, gummies, etc Psyllium bran  (Metamucil), methylcellulose (Citrucel), Miralax or Glycolax, Benefiber, Flax Seed.  Adjust the dose week-by-week (1/2 dose/day to 6 doses a day) until you are moving your bowels 1-2 times a day.  Cut back the dose or try a different fiber product if it is giving you problems such as diarrhea or bloating. Sometimes a laxative is needed to help jump-start bowels if constipated until the fiber supplement can help regulate your bowels.  If you are tolerating eating & you are farting, it is okay to try a gentle laxative such as double dose MiraLax, prune juice, or Milk of Magnesia.  Avoid using laxatives too often. Stool softeners can sometimes help counteract the constipating effects of narcotic pain medicines.  It can also cause diarrhea, so avoid using for too long. If you are still constipated despite taking fiber daily, eating solids, and a few doses of laxatives, call our office. Controlling diarrhea Try drinking liquids and eating bland foods for a few days to avoid stressing your intestines further. Avoid dairy products (especially milk & ice cream) for a short time.  The intestines often can lose the ability to digest lactose when stressed. Avoid foods that cause gassiness or bloating.  Typical foods include beans and other legumes, cabbage, broccoli, and dairy foods.  Avoid greasy, spicy, fast foods.  Every person has some sensitivity to other foods, so listen to your body and avoid those foods that trigger problems for you. Probiotics (such as active yogurt, Align, etc) may help repopulate the intestines and colon with normal bacteria and calm down a sensitive digestive tract Adding a fiber supplement gradually can help thicken stools by absorbing excess fluid and retrain the intestines to act more normally.  Slowly increase the dose over a few weeks.  Too much fiber too soon can backfire and cause cramping & bloating. It is okay to try and slow down diarrhea with a few doses of antidiarrheal  medicines.   Bismuth subsalicylate (ex. Kayopectate, [place]) for a  few doses can help control diarrhea.  Avoid if pregnant.   Loperamide (Imodium) can slow down diarrhea.  Start with one tablet (2mg ) first.  Avoid if you are having fevers or severe pain.  ILEOSTOMY PATIENTS WILL HAVE CHRONIC DIARRHEA since their colon is not in use.    Drink plenty of liquids.  You will need to drink even more glasses of water/liquid a day to avoid getting dehydrated. Record output from your ileostomy.  Expect to empty the bag every 3-4 hours at first.  Most people with a permanent ileostomy empty their bag 4-6 times at the least.   Use antidiarrheal medicine (especially Imodium) several times a day to avoid getting dehydrated.  Start with a dose at bedtime & breakfast.  Adjust up or down as needed.  Increase antidiarrheal medications as directed to avoid emptying the bag more than 8 times a day (every 3 hours). Work with your wound ostomy nurse to learn care for your ostomy.  See ostomy care instructions. TROUBLESHOOTING IRREGULAR BOWELS 1) Start with a soft & bland diet. No spicy, greasy, or fried foods.  2) Avoid gluten/wheat or dairy products from diet to see if symptoms improve. 3) Miralax 17gm or flax seed mixed in [place]. water or juice-daily. May use 2-4 times a day as needed. 4) Gas-X, Phazyme, etc. as needed for gas & bloating.  5) Prilosec (omeprazole) over-the-counter as needed 6)  Consider probiotics (Align, Activa, etc) to help calm the bowels down  Call your doctor if you are getting worse or not getting better.  Sometimes further testing (cultures, endoscopy, X-ray studies, CT scans, bloodwork, etc.) may be needed to help diagnose and treat the cause of the diarrhea. [facility], [address], [place], [place], [place]  [address] [phone] - Main.    [phone]  - Toll Free.   [phone] - Fax [email or web address]  GETTING TO GOOD BOWEL  HEALTH. Irregular bowel habits such as constipation and diarrhea can lead to many problems over time.  Having one soft bowel movement a day is the most important way to prevent further problems.  The anorectal canal is designed to handle stretching and feces to safely manage our ability to get rid of solid waste (feces, poop, stool) out of our body.  BUT, hard constipated stools can act like ripping concrete bricks and diarrhea can be a burning fire to this very sensitive area of our body, causing inflamed hemorrhoids, anal fissures, increasing risk is perirectal abscesses, abdominal pain/bloating, an making irritable bowel worse.      The goal: ONE SOFT BOWEL MOVEMENT A DAY!  To have soft, regular bowel movements:   Drink plenty of fluids, consider 4-6 tall glasses of water a day.    Take plenty of fiber.  Fiber is the undigested part of plant food that passes into the colon, acting s natures broom to encourage bowel motility and movement.  Fiber can absorb and hold large amounts of water. This results in a larger, bulkier stool, which is soft and easier to pass. Work gradually over several weeks up to 6 servings a day of fiber (25g a day even more if needed) in the form of: o Vegetables -- Root (potatoes, carrots, turnips), leafy green (lettuce, salad greens, celery, spinach), or cooked high residue (cabbage, broccoli, etc) o Fruit -- Fresh (unpeeled skin & pulp), Dried (prunes, apricots, cherries, etc ),  or stewed ( applesauce)  o Whole grain breads, pasta, etc (whole wheat)  o Bran  cereals   Bulking Agents -- This type of water-retaining fiber generally is easily obtained each day by one of the following:  o Psyllium bran -- The psyllium plant is remarkable because its ground seeds can retain so much water. This product is available as Metamucil, Konsyl, Effersyllium, Per Diem Fiber, or the less expensive generic preparation in drug and health food stores. Although labeled a laxative, it really  is not a laxative.  o Methylcellulose -- This is another fiber derived from wood which also retains water. It is available as Citrucel. o Polyethylene Glycol - and artificial fiber commonly called Miralax or Glycolax.  It is helpful for people with gassy or bloated feelings with regular fiber o Flax Seed - a less gassy fiber than psyllium  No reading or other relaxing activity while on the toilet. If bowel movements take longer than 5 minutes, you are too constipated  AVOID CONSTIPATION.  High fiber and water intake usually takes care of this.  Sometimes a laxative is needed to stimulate more frequent bowel movements, but   Laxatives are not a good long-term solution as it can wear the colon out.  They can help jump-start bowels if constipated, but should be relied on constantly without discussing with your doctor o Osmotics (Milk of Magnesia, Fleets phosphosoda, Magnesium citrate, MiraLax, GoLytely) are safer than  o Stimulants (Senokot, Castor Oil, Dulcolax, Ex Lax)    o Avoid taking laxatives for more than 7 days in a row.   IF SEVERELY CONSTIPATED, try a Bowel Retraining Program: o Do not use laxatives.  o Eat a diet high in roughage, such as bran cereals and leafy vegetables.  o Drink six (6) ounces of prune or apricot juice each morning.  o Eat two (2) large servings of stewed fruit each day.  o Take one (1) heaping tablespoon of a psyllium-based bulking agent twice a day. Use sugar-free sweetener when possible to avoid excessive calories.  o Eat a normal breakfast.  o Set aside 15 minutes after breakfast to sit on the toilet, but do not strain to have a bowel movement.  o If you do not have a bowel movement by the third day, use an enema and repeat the above steps.   Controlling diarrhea o Switch to liquids and simpler foods for a few days to avoid stressing your intestines further. o Avoid dairy products (especially milk & ice cream) for a short time.  The intestines often can  lose the ability to digest lactose when stressed. o Avoid foods that cause gassiness or bloating.  Typical foods include beans and other legumes, cabbage, broccoli, and dairy foods.  Every person has some sensitivity to other foods, so listen to our body and avoid those foods that trigger problems for you. o Adding fiber (Citrucel, Metamucil, psyllium, Miralax) gradually can help thicken stools by absorbing excess fluid and retrain the intestines to act more normally.  Slowly increase the dose over a few weeks.  Too much fiber too soon can backfire and cause cramping & bloating. o Probiotics (such as active yogurt, Align, etc) may help repopulate the intestines and colon with normal bacteria and calm down a sensitive digestive tract.  Most studies show it to be of mild help, though, and such products can be costly. o Medicines: - Bismuth subsalicylate (ex. Kayopectate, Pepto Bismol) every 30 minutes for up to 6 doses can help control diarrhea.  Avoid if pregnant. - Loperamide (Immodium) can slow down diarrhea.  Start with two tablets (  4mg  total) first and then try one tablet every 6 hours.  Avoid if you are having fevers or severe pain.  If you are not better or start feeling worse, stop all medicines and call your doctor for advice o Call your doctor if you are getting worse or not better.  Sometimes further testing (cultures, endoscopy, X-ray studies, bloodwork, etc) may be needed to help diagnose and treat the cause of the diarrhea.  TROUBLESHOOTING IRREGULAR BOWELS 1) Avoid extremes of bowel movements (no bad constipation/diarrhea) 2) Miralax 17gm mixed in 8oz. water or juice-daily. May use BID as needed.  3) Gas-x,Phazyme, etc. as needed for gas & bloating.  4) Soft,bland diet. No spicy,greasy,fried foods.  5) Prilosec over-the-counter as needed  6) May hold gluten/wheat products from diet to see if symptoms improve.  7)  May try probiotics (Align, Activa, etc) to help calm the bowels down 7)  If symptoms become worse call back immediately.  Managing Pain  Pain after surgery or related to activity is often due to strain/injury to muscle, tendon, nerves and/or incisions.  This pain is usually short-term and will improve in a few months.   Many people find it helpful to do the following things TOGETHER to help speed the process of healing and to get back to regular activity more quickly:  1. Avoid heavy physical activity at first a. No lifting greater than 20 pounds at first, then increase to lifting as tolerated over the next few weeks b. Do not push through the pain.  Listen to your body and avoid positions and maneuvers than reproduce the pain.  Wait a few days before trying something more intense c. Walking is okay as tolerated, but go slowly and stop when getting sore.  If you can walk 30 minutes without stopping or pain, you can try more intense activity (running, jogging, aerobics, cycling, swimming, treadmill, sex, sports, weightlifting, etc ) d. Remember: If it hurts to do it, then dont do it!  2. Take Anti-inflammatory medication a. Choose ONE of the following over-the-counter medications: i.            Acetaminophen 500mg  tabs (Tylenol) 1-2 pills with every meal and just before bedtime (avoid if you have liver problems) ii.            Naproxen 220mg  tabs (ex. Aleve) 1-2 pills twice a day (avoid if you have kidney, stomach, IBD, or bleeding problems) iii. Ibuprofen 200mg  tabs (ex. Advil, Motrin) 3-4 pills with every meal and just before bedtime (avoid if you have kidney, stomach, IBD, or bleeding problems) b. Take with food/snack around the clock for 1-2 weeks i. This helps the muscle and nerve tissues become less irritable and calm down faster  3. Use a Heating pad or Ice/Cold Pack a. 4-6 times a day b. May use warm bath/hottub  or showers  4. Try Gentle Massage and/or Stretching  a. at the area of pain many times a day b. stop if you feel pain - do not overdo  it  Try these steps together to help you body heal faster and avoid making things get worse.  Doing just one of these things may not be enough.    If you are not getting better after two weeks or are noticing you are getting worse, contact our office for further advice; we may need to re-evaluate you & see what other things we can do to help.  Diverticulitis Diverticulitis is inflammation or infection of small pouches in your colon  that form when you have a condition called diverticulosis. The pouches in your colon are called diverticula. Your colon, or large intestine, is where water is absorbed and stool is formed. Complications of diverticulitis can include:  Bleeding.  Severe infection.  Severe pain.  Perforation of your colon.  Obstruction of your colon. CAUSES  Diverticulitis is caused by bacteria. Diverticulitis happens when stool becomes trapped in diverticula. This allows bacteria to grow in the diverticula, which can lead to inflammation and infection. RISK FACTORS People with diverticulosis are at risk for diverticulitis. Eating a diet that does not include enough fiber from fruits and vegetables may make diverticulitis more likely to develop. SYMPTOMS  Symptoms of diverticulitis may include:  Abdominal pain and tenderness. The pain is normally located on the left side of the abdomen, but may occur in other areas.  Fever and chills.  Bloating.  Cramping.  Nausea.  Vomiting.  Constipation.  Diarrhea.  Blood in your stool. DIAGNOSIS  Your health care provider will ask you about your medical history and do a physical exam. You may need to have tests done because many medical conditions can cause the same symptoms as diverticulitis. Tests may include:  Blood tests.  Urine tests.  Imaging tests of the abdomen, including X-rays and CT scans. When your condition is under control, your health care provider may recommend that you have a colonoscopy. A colonoscopy  can show how severe your diverticula are and whether something else is causing your symptoms. TREATMENT  Most cases of diverticulitis are mild and can be treated at home. Treatment may include:  Taking over-the-counter pain medicines.  Following a clear liquid diet.  Taking antibiotic medicines by mouth for 7-10 days. More severe cases may be treated at a hospital. Treatment may include:  Not eating or drinking.  Taking prescription pain medicine.  Receiving antibiotic medicines through an IV tube.  Receiving fluids and nutrition through an IV tube.  Surgery. HOME CARE INSTRUCTIONS   Follow your health care provider's instructions carefully.  Follow a full liquid diet or other diet as directed by your health care provider. After your symptoms improve, your health care provider may tell you to change your diet. He or she may recommend you eat a high-fiber diet. Fruits and vegetables are good sources of fiber. Fiber makes it easier to pass stool.  Take fiber supplements or probiotics as directed by your health care provider.  Only take medicines as directed by your health care provider.  Keep all your follow-up appointments. SEEK MEDICAL CARE IF:   Your pain does not improve.  You have a hard time eating food.  Your bowel movements do not return to normal. SEEK IMMEDIATE MEDICAL CARE IF:   Your pain becomes worse.  Your symptoms do not get better.  Your symptoms suddenly get worse.  You have a fever.  You have repeated vomiting.  You have bloody or black, tarry stools. MAKE SURE YOU:   Understand these instructions.  Will watch your condition.  Will get help right away if you are not doing well or get worse.   This information is not intended to replace advice given to you by your health care provider. Make sure you discuss any questions you have with your health care provider.   Document Released: 02/13/2005 Document Revised: 05/11/2013 Document Reviewed:  03/31/2013 Elsevier Interactive Patient Education [employer].

## 2015-10-23 NOTE — Progress Notes (Signed)
[facility]  [address] [place], [place] [id number] Phone: [phone] FAX: [phone]   Hong Vogler [id number] [birth year]  CARE TEAM:  PCP: Idamae Schuller, MD  Outpatient Care Team: Patient Care Team: Loraine Leriche, MD as PCP - General (Internal Medicine) Michael Boston, MD as Consulting Physician (General Surgery)  Inpatient Treatment Team: Treatment Team: Attending Provider: Michael Boston, MD; Technician: Rhae Hammock, NT; Technician: Resa Miner Spaugh, NT; Technician: Leda Quail, NT; Technician: Resa Miner, NT; Registered Nurse: Bailey Mech, RN  Problem List:   Principal Problem:   Diverticulitis of colon with perforation Active Problems:   IBS (irritable bowel syndrome)   Inflammatory arthropathy ([place])   Chronic back pain   Anxiety   DD (diverticular disease)   Essential (primary) hypertension   Generalized OA   [place]le[place] hearing loss, bilateral   Diverticulitis of large intestine with perforation and abscess without bleeding   Cervical pain (neck)   5 Days Post-Op  10/18/2015   PROCEDURE:   XI ROBOT ASSISTED LOW ANTERIOR RECTOSIGMOID RESECTION Robotic lysis of adhesions x 65min Robotic mobilization of the splenic flexure of the colon RIGID PROCTOSCOPY  SURGEON: Michael Boston, MD  ASSISTANT: Leighton Ruff, MD  Diagnosis  1. Colon, segmental resection - DIVERTICULITIS WITH PERFORATION AND PERICOLONIC ABSCESS. - NO EVIDENCE OF MALIGNANCY. 2. Colon, resection margin (donut), anastomosis proxmial - UNREMARKABLE COLON. - NO INFLAMMATION OR MALIGNANCY. 3. Colon, resection margin (donut), anastomosis distal end - UNREMARKABLE COLON. - NO INFLAMMATION OR MALIGNANCY. Claudette Laws MD Pathologist, Electronic Signature (Case signed 10/20/2015)   Assessment  Recovering  Plan:  -solid diet  -dressings off - drain to come out -IVF PRN bolus & follow HR - prob a  little dry intravascularly -watch BP - hold cardura & switch to usual atenolol -Hgb low but stable - PO iron -pain control - ice, tylenol TID,gabapentin TID, soma QHS & PRN, IV & PO Dilaudid working (hard for her to be pain free.  MS pain at drain & extraction RLQ incision - not c/w leak/abscess/cellulitis.  -f/u pathology - benign - d/w pt/son/RN.  Path report copy in room -anxiolysis -VTE prophylaxis- SCDs, etc -mobilize as tolerated to help recovery -HH PT.  See if OT can see (pt was too busy last attempt).  Pt declines SNF & wants to go home.  I updated the patient's status to the patient and family (son).   Also w RN.  Recommendations were made.  Questions were answered.  They expressed understanding & appreciation.  [name], M.D., F.A.C.S. Gastrointestinal and Minimally Invasive Surgery [facility] [address], [place], [place] [address] [phone] Main / Paging   10/23/2015  Subjective:  Walked x 3 in hallways with walker Sore/tired Son in room  Tol solids Wanting hydrocodone  Objective:  Vital signs:  Filed Vitals:   10/22/15 1433 10/22/15 2158 10/22/15 2208 10/23/15 0636  BP: 146/68 167/88  137/73  Pulse: 96 113  106  Temp: 99 F (37.2 C) 98.8 F (37.1 C)  97.8 F (36.6 C)  TempSrc: Oral Oral  Oral  Resp: 20 20  18   Height:      Weight:   65.772 kg (145 lb)   SpO2: 97% 100%  99%    Last BM Date: 10/22/15  Intake/Output   Yesterday:  06/04 0701 - 06/05 0700 In: 603 [P.O.:600; I.V.:3] Out: 3097 [Urine:3050; Drains:47] This shift:     Bowel function:  Flatus: YES  BM:  YES  Drain: Serosanguinous - thin   Physical Exam:  General: Pt awake/alert/oriented x4 in No acute distress.  Smiling/grateful.  Gave me a hug Eyes: PERRL, normal EOM.  Sclera clear.  No icterus Neuro: CN II-XII intact w/o focal sensory/motor deficits. Lymph: No head/neck/groin lymphadenopathy Psych:  No  delerium/psychosis/paranoia HENT: Normocephalic, Mucus membranes moist.  No thrush Neck: Supple, No tracheal deviation Chest: No chest wall pain w good excursion CV:  Pulses intact.  Regular rhythm MS: Normal AROM mjr joints.  No obvious deformity Abdomen: Soft.  Nondistended.  Mildly tender at incisions only.  No evidence of peritonitis.  No incarcerated hernias. Ext:  SCDs BLE.  No mjr edema.  No cyanosis Skin: No petechiae / purpura  Results:   Labs: Results for orders placed or performed during the hospital encounter of 10/18/15 (from the past 48 hour(s))  CBC     Status: Abnormal   Collection Time: 10/22/15  4:04 AM  Result Value Ref Range   WBC 10.3 4.0 - 10.5 K/uL   RBC 2.68 (L) 3.87 - 5.11 MIL/uL   Hemoglobin 8.3 (L) 12.0 - 15.0 g/dL   HCT 24.8 (L) 36.0 - 46.0 %   MCV 92.5 78.0 - 100.0 fL   MCH 31.0 26.0 - 34.0 pg   MCHC 33.5 30.0 - 36.0 g/dL   RDW 15.6 (H) 11.5 - 15.5 %   Platelets 287 150 - 400 K/uL    Imaging / Studies: No results found.  Medications / Allergies: per chart  Antibiotics: Anti-infectives    Start     Dose/Rate Route Frequency Ordered Stop   10/19/15 0200  cefoTEtan (CEFOTAN) 2 g in dextrose 5 % 50 mL IVPB     2 g 100 mL/hr over 30 Minutes Intravenous Every 12 hours 10/18/15 1949 10/19/15 0122   10/18/15 0945  cefoTEtan (CEFOTAN) 2 g in dextrose 5 % 50 mL IVPB     2 g 100 mL/hr over 30 Minutes Intravenous On call to O.R. 10/18/15 0945 10/18/15 1410   10/18/15 0945  clindamycin (CLEOCIN) 900 mg, gentamicin (GARAMYCIN) 240 mg in sodium chloride 0.9 % 1,000 mL for intraperitoneal lavage  Status:  Discontinued    Comments:  Pharmacy may adjust dosing strength, schedule, rate of infusion, etc as needed to optimize therapy    Intraperitoneal To Surgery 10/18/15 0944 10/18/15 1943        Note: Portions of this report may have been transcribed using voice recognition software. Every effort was made to ensure accuracy; however, inadvertent  computerized transcription errors may be present.   Any transcriptional errors that result from this process are unintentional.     [name], M.D., F.A.C.S. Gastrointestinal and Minimally Invasive Surgery [facility] [address] [place], [place] [address] [phone] Main / Paging   10/23/2015

## 2015-10-23 NOTE — Care Management Important Message (Signed)
Important Message  Patient Details  Name: Elizabeth Mullins MRN: [id number] Date of Birth: 03-Jul-1931   Medicare Important Message Given:  Yes    Camillo Flaming 10/23/2015, 10:36 AMImportant Message  Patient Details  Name: Elizabeth Mullins MRN: [id number] Date of Birth: Jul 10, 1931   Medicare Important Message Given:  Yes    Camillo Flaming 10/23/2015, 10:35 AM

## 2015-10-23 NOTE — Care Management Note (Signed)
Case Management Note  Patient Details  Name: Elizabeth Mullins MRN: [id number] Date of Birth: 10-29-1931  Subjective/Objective: [place] [place] orders placed-Spoke to son about d/c choice for [facility] chosen HHPT/HHOT-rep Santiago Glad aware of d/c & orders., youth rw, 3n1-[facility] dme rep Jermaine aware of orders & d/c, aware to bring to rm prior d/c.                   Action/Plan:d/c home w/HHC/DME.   Expected Discharge Date:                  Expected Discharge Plan:  [place]  In-House Referral:     Discharge planning Services  CM Consult  Post Acute Care Choice:    Choice offered to:  Adult Children  DME Arranged:  3-N-1, [facility] DME Agency:  [facility] Arranged:  PT, OT [place] Agency:  [facility]  Status of Service:  Completed, signed off  Medicare Important Message Given:  Yes Date Medicare IM Given:    Medicare IM give by:    Date Additional Medicare IM Given:    Additional Medicare Important Message give by:     If discussed at [place] of Stay Meetings, dates discussed:    Additional Comments:  Dessa Phi, RN 10/23/2015, 10:42 AM

## 2015-10-27 ENCOUNTER — Other Ambulatory Visit (HOSPITAL_COMMUNITY): Payer: Self-pay | Admitting: General Surgery

## 2015-10-27 ENCOUNTER — Ambulatory Visit (HOSPITAL_COMMUNITY)
Admission: RE | Admit: 2015-10-27 | Discharge: 2015-10-27 | Disposition: A | Discharge status: Home / Self Care | Payer: Medicare Other | Source: Ambulatory Visit | Attending: General Surgery | Admitting: General Surgery

## 2015-10-27 DIAGNOSIS — K5720 Diverticulitis of large intestine with perforation and abscess without bleeding: Secondary | ICD-10-CM

## 2015-10-27 DIAGNOSIS — R609 Edema, unspecified: Secondary | ICD-10-CM | POA: Insufficient documentation

## 2016-02-20 ENCOUNTER — Encounter: Payer: Self-pay | Admitting: Radiology

## 2016-05-24 ENCOUNTER — Ambulatory Visit: Payer: Self-pay | Admitting: Surgery

## 2016-05-24 NOTE — H&P (Signed)
Elizabeth Mullins 05/06/2016 5:03 PM Location: [facility] Patient #: [id number] DOB: Dec 30, 1931 Widowed / Language: [name] / Race: White Female   History of Present Illness Elizabeth Hector MD; 05/06/2016 5:32 PM) The patient is a 83 year old female who presents with an abdominal mass. Note for "Abdominal mass": Patient comes in today for concern of new abdominal mass.  Pleasant 83 year old female. Head diverticulitis with an abscess. Underwent robotically assisted sigmoid colectomy earlier this year. Recovered rather well. Patient's been more active and helping with moving. She is no some lumps on her right side. It does not hurt. She has noted some pain on her left lower abdomen as well. Usually more with activity level. Usually goes away. She has not been to compliant on her MiraLAX. Sometimes she feels more discomfort when she is constipated. Then a gets better. No nausea or vomiting. No fevers or chills. Energy level good. She does not smoke. Remaining active.    10/18/2015  PATIENT: Elizabeth Mullins 83 y.o. female  Patient Care Team: Loraine Leriche, MD as PCP - General (Internal Medicine) Michael Boston, MD as Consulting Physician (General Surgery)  PRE-OPERATIVE DIAGNOSIS: Persistent sigmoid diverticulitis with recurrent abscesses  POST-OPERATIVE DIAGNOSIS: Persistent sigmoid diverticulitis with recurrent abscesses   PROCEDURE:   XI ROBOT ASSISTED LOW ANTERIOR RECTOSIGMOID RESECTION Robotic lysis of adhesions x 24min Robotic mobilization of the splenic flexure of the colon RIGID PROCTOSCOPY  SURGEON: Michael Boston, MD  ASSISTANT: Leighton Ruff, MD  ANESTHESIA: local and general  EBL: Total I/O In: 3000 [I.V.:3000] Out: 600 [Urine:300; Blood:300]  Delay start of Pharmacological VTE agent (>24hrs) due to surgical blood loss or risk of bleeding: no  DRAINS: (19Fr) Blake drain(s) in the PELVIS  SPECIMEN: Source of Specimen:   1.  Rectosigmoid colon with severe diverticulitis. Open end is proximal.  2. Anastomotic rings. Blue stitch is proximal anastomotic ring.  DISPOSITION OF SPECIMEN: PATHOLOGY  COUNTS: YES  PLAN OF CARE: Admit to inpatient  PATIENT DISPOSITION: PACU - hemodynamically stable.  INDICATION:   Pleasant elderly woman struggling with diverticulitis with recurrent episodes of pain. Developed abscess formation. Percutaneously drained. Recommendation made for surgery. Drain partially dislodged. New abscess. A drain. Better. I recommended surgical resection. Noted possibility need of ostomy. In discussion with the patient and daughter. Patient anxious but consolable.  The anatomy & physiology of the digestive tract was discussed. The pathophysiology was discussed. Natural history risks without surgery was discussed. I worked to give an overview of the disease and the frequent need to have multispecialty involvement. I feel the risks of no intervention will lead to serious problems that outweigh the operative risks; therefore, I recommended a partial colectomy to remove the pathology. Laparoscopic & open techniques were discussed.  Risks such as bleeding, infection, abscess, leak, reoperation, possible ostomy, hernia, heart attack, death, and other risks were discussed. I noted a good likelihood this will help address the problem. Goals of post-operative recovery were discussed as well. We will work to minimize complications. Educational materials on the pathology had been given in the office. Questions were answered.   The patient & daughter expressed understanding & wished to proceed with surgery.  OR FINDINGS:  Patient had very torturous inflamed corkscrew sigmoid colon. Very dense adhesion to left anterior pelvic rim. Fallopian tubes on both sides as well as uterus and ovaries and densely adherent to the liver. Most likely location of walled off abscess. Very  foreshortened left colon mesentery requiring splenic flexure mobilization.  No obvious  metastatic disease on visceral parietal peritoneum or liver.  The anastomosis rests 12 cm from the anal verge by rigid proctoscopy.  Diagnosis 1. Colon, segmental resection - DIVERTICULITIS WITH PERFORATION AND PERICOLONIC ABSCESS. - NO EVIDENCE OF MALIGNANCY. 2. Colon, resection margin (donut), anastomosis proxmial - UNREMARKABLE COLON. - NO INFLAMMATION OR MALIGNANCY. 3. Colon, resection margin (donut), anastomosis distal end - UNREMARKABLE COLON. - NO INFLAMMATION OR MALIGNANCY. Claudette Laws MD Pathologist, Electronic Signature (Case signed 10/20/2015) Specimen Jalah Warmuth and Clinical Information Specimen(s) Obtained: 1. Colon, segmental resection 2. Colon, resection margin (donut), anastomosis proxmial 3. Colon, resection margin (donut), anastomosis distal end Specimen Clinical Information 1. persistent sigmoid colectomy rigid proctoscopy (kp) Anginette Espejo 1. Specimen: Intact portion of sigmoid colon with an open proximal margin and stapled distal margin. There is also a focally disrupted, 2.5 x 1.5 cm area located 8 cm from the proximal margin. Length: 27 cm in length and averaging 3.5 cm in diameter. Serosa: ragged dusky red-brown and disrupted. Contents: Opening reveals minimal fecal material. Mucosa/Wall: Glistening, tan-pink mucosal folds with a stenotic lumen and wall ranging from 0.5 to 1.5 cm in thickness. Sectioning reveals moderate to severe diverticular disease with a possible perforated diverticula in the disrupted area of the serosa. Also identified in this area is a irregularly shaped, 4.0 x 1.5 x 1.5 cm area of ragged red 1 of 2 FINAL for Ekman, Elizabeth Mullins) Elizabeth Mullins(continued) brown nodularity which contains an ovoid tan-white cut surface. Lymph nodes: Three lymph nodes are grossly palpable in the attached fat ranging from 0.3 to 0.7 cm. Block Summary: A= proximal margin. B=  distal margin. C= area of disrupted serosa. D-E= representative diverticula F-H= area of nodular ovoid tan white tissue attached to a disrupted area. I= two lymph nodes. J= larger lymph node bisected. 10 blocks total. 2. Received in formalin is a 2.5 x 2.0 x 1.0 cm ovoid, annular tan-pink tissue with blue suture material designating the proximal end. The specimen has a granular, tan-pink mucosa and a 0.4 cm lumen. Margin is taken en face and submitted in block 2A. 3. Received in formalin is a 2.5 x 1.5 x 1.2 cm ovoid portion of glistening, tan-pink mucosa with an open end and a stapled end. Representative sections of the margin are submitted in block 3A. (KF:gt, 10/20/15) Report signed out from the following location(s) Technical component and interpretation was performed at [facility] [place], [place], [place] [address]. CLIA #: [id number],   Problem List/Past Medical Elizabeth Hector, MD; 05/06/2016 5:15 PM) ABSCESS OF SIGMOID COLON DUE TO DIVERTICULITIS (K57.20)  ENCOUNTER FOR PREOPERATIVE EXAMINATION FOR GENERAL SURGICAL PROCEDURE (Z01.818)  COLONIC FISTULA (K63.2)  S/P COLECTOMY (Z90.49)  VISIT FOR WOUND CHECK (Z51.89)  HX DIVERTICULITIS OF COLON - F/U PRN (Z87.19)   Past Surgical History Elizabeth Hector, MD; 05/06/2016 5:15 PM) Appendectomy  Breast Biopsy  Right. Hysterectomy (not due to cancer) - Partial  Knee Surgery  Right. Tonsillectomy   Diagnostic Studies History Elizabeth Hector, MD; 05/06/2016 5:15 PM) Colonoscopy  5-10 years ago  Allergies Benjiman Core, CMA; 05/06/2016 5:09 PM) OXYCODONE  Codeine Phosphate *ANALGESICS - OPIOID*  Demerol *ANALGESICS - OPIOID*  Dilaudid *ANALGESICS - OPIOID*  Morphine Sulfate (Concentrate) *ANALGESICS - OPIOID*  FentaNYL *ANALGESICS - OPIOID*  NSAIDs  Allergies Reconciled   Medication History (Armen Glenn, CMA; 05/06/2016 5:13 PM) ALPRAZolam (0.5MG  Tablet, Oral)  Active. Carisoprodol (350MG  Tablet, Oral) Active. Doxazosin Mesylate (2MG  Tablet, Oral) Active. Atenolol (50MG  Tablet, Oral) Active. Vigamox (0.5% Solution, Ophthalmic) Active.  Raloxifene HCl (60MG  Tablet, Oral) Active. Gabapentin (800MG  Tablet, Oral) Active. PrednisoLONE Acetate (1% Suspension, Ophthalmic) Active. Medications Reconciled  Social History Elizabeth Hector, MD; 05/06/2016 5:15 PM) Caffeine use  Coffee. No alcohol use  No drug use  Tobacco use  Never smoker.  Family History Elizabeth Hector, MD; 05/06/2016 5:15 PM) Arthritis  Daughter, Mother, Sister. Hypertension  Mother. Prostate Cancer  Father.  Pregnancy / Birth History Elizabeth Hector, MD; 05/06/2016 5:15 PM) Age at menarche  75 years.  Other Problems Elizabeth Hector, MD; 05/06/2016 5:15 PM) Back Pain  Diverticulosis  High blood pressure   Vitals (Armen Glenn CMA; 05/06/2016 5:06 PM) 05/06/2016 5:05 PM Weight: 140.38 lb Height: 59in Body Surface Area: 1.59 m Body Mass Index: 28.35 kg/m  Temp.: 98.21F  Pulse: 83 (Regular)  P.OX: 95% (Room air) BP: 126/72 (Sitting, Left Arm, Standard)       Physical Exam Elizabeth Hector, MD; 05/06/2016 5:15 PM) General Mental Status-Alert. General Appearance-Not in acute distress. Voice-Normal. Note: Relaxed. Nontoxic.   Integumentary Global Assessment Normal Exam - Distribution of scalp and body hair is normal. General Characteristics Overall Skin Surface - no rashes and no suspicious lesions.  Head and Neck Head-normocephalic, atraumatic with no lesions or palpable masses. Face Global Assessment - atraumatic, no absence of expression. Neck Global Assessment - no abnormal movements, no decreased range of motion. Trachea-midline. Thyroid Gland Characteristics - non-tender.  Eye Eyeball - Left-Extraocular movements intact, No Nystagmus. Eyeball - Right-Extraocular movements intact, No  Nystagmus. Upper Eyelid - Left-No Cyanotic. Upper Eyelid - Right-No Cyanotic.  Chest and Lung Exam Inspection Accessory muscles - No use of accessory muscles in breathing.  Abdomen Note: Incisions with normal healing ridges. No hernia. No cellulitis. No guarding/rebound tenderness   Peripheral Vascular Upper Extremity Inspection - Left - Not Gangrenous, No Petechiae. Right - Not Gangrenous, No Petechiae.  Neurologic Neurologic evaluation reveals -normal attention span and ability to concentrate, able to name objects and repeat phrases. Appropriate fund of knowledge and normal coordination.  Neuropsychiatric Mental status exam performed with findings of-able to articulate well with normal speech/language, rate, volume and coherence and no evidence of hallucinations, delusions, obsessions or homicidal/suicidal ideation. Orientation-oriented X3.  Musculoskeletal Global Assessment Gait and Station - normal gait and station.  Lymphatic General Lymphatics Description - No Generalized lymphadenopathy.    Assessment & Plan Elizabeth Hector MD; 05/06/2016 5:31 PM) INCISIONAL HERNIA, WITHOUT OBSTRUCTION OR GANGRENE (K43.2) Impression: Small but definite incisional hernia at the right sided extraction point. Standard of care would be repair of hernia. Stitches failed, so therefore do mesh. Underlay repair.  Because it is small and not bothersome, she wishes to hold off on any surgery. Should it become larger or bother her, she will reconsider and let us know. Current Plans Pt Education - [facility] Free Text Education/Instructions: discussed with patient and provided information. LLQ PAIN (R10.32) Impression: Mild but definite left lower quadrant pain. Perhaps related to constipation. I stressed that she needs to be more regular on her MiraLAX to avoid constipation and discomfort.  Another possibility is muscle wall strain since it seems to be activity related. Recommended heat  and anti-inflammatories. It her kidneys, would do Tylenol. That should go away by 3 months. If it is worsening, more suspicious for hernia.  Another possibility is a left inguinal hernia. Nothing definite on exam. Should she have a hernia repair of her incisional hernia, would do diagnostic laparoscopy to look on that side and fix it if I  find it. If that is negative, that is reassuring. Current Plans Pt Education - [facility] Pain control - tylenol only: discussed with patient and provided information. ENCOUNTER FOR PREOPERATIVE EXAMINATION FOR GENERAL SURGICAL PROCEDURE (Z01.818) Current Plans Written instructions provided The anatomy & physiology of the abdominal wall was discussed. The pathophysiology of hernias was discussed. Natural history risks without surgery including progeressive enlargement, pain, incarceration, & strangulation was discussed. Contributors to complications such as smoking, obesity, diabetes, prior surgery, etc were discussed.  I feel the risks of no intervention will lead to serious problems that outweigh the operative risks; therefore, I recommended surgery to reduce and repair the hernia. I explained laparoscopic techniques with possible need for an open approach. I noted the probable use of mesh to patch and/or buttress the hernia repair  Risks such as bleeding, infection, abscess, need for further treatment, heart attack, death, and other risks were discussed. I noted a good likelihood this will help address the problem. Goals of post-operative recovery were discussed as well. Possibility that this will not correct all symptoms was explained. I stressed the importance of low-impact activity, aggressive pain control, avoiding constipation, & not pushing through pain to minimize risk of post-operative chronic pain or injury. Possibility of reherniation especially with smoking, obesity, diabetes, immunosuppression, and other health conditions was discussed. We will work  to minimize complications.  An educational handout further explaining the pathology & treatment options was given as well. Questions were answered. The patient expresses understanding & wishes to proceed with surgery.  Pt Education - [facility] Hernia Post-Op HCI (Chrisotpher Rivero): discussed with patient and provided information. Pt Education - [facility] Pain Control (Jamai Dolce) Pt Education - Pamphlet Given - Laparoscopic Hernia Repair: discussed with patient and provided information. HX DIVERTICULITIS OF COLON - F/U PRN (Z87.19) Current Plans Return to clinic as needed.  Soreness, decreased appetite, and poor energy level are common problems after surgery. While many people can struggle with a bad day, these concerns should gradually fade away or at least improve. Much of your recovery depends on your health & the severity of your operation. Please call if you have any further questions / concerns related to surgery.  Increase activity as tolerated to regular everyday activity. Consider daily low impact exercise every day such as walking an hour a day.  Do not push through pain. If it hurts to do it, then don't do it.  Diet as tolerated. Low fat high fiber diet ideal. 30 g fiber a day ideal. Consider taking a daily fiber supplement to keep your bowels regular.  Followup with your primary care physician for other health issues as would normally be done.  Consider screening for malignancies (breast, prostate, colon, melanoma, etc) as appropriate. Discuss with you primary care physician.  Pt Education - [facility] Diverticular Disease (AT) Pt Education - [facility] Good Bowel Health (Azir Muzyka)  Elizabeth Mullins, M.D., F.A.C.S. Gastrointestinal and Minimally Invasive Surgery [facility] [address], [place], [place] [address] [phone] Main / Paging

## 2016-06-03 ENCOUNTER — Encounter (HOSPITAL_COMMUNITY): Payer: Self-pay

## 2016-06-03 ENCOUNTER — Emergency Department (HOSPITAL_COMMUNITY): Payer: Medicare Other

## 2016-06-03 ENCOUNTER — Observation Stay (HOSPITAL_COMMUNITY)
Admission: EM | Admit: 2016-06-03 | Discharge: 2016-06-06 | Disposition: A | Discharge status: Home / Self Care | Payer: Medicare Other | Attending: Internal Medicine | Admitting: Internal Medicine

## 2016-06-03 DIAGNOSIS — M1990 Unspecified osteoarthritis, unspecified site: Secondary | ICD-10-CM | POA: Insufficient documentation

## 2016-06-03 DIAGNOSIS — Z6828 Body mass index (BMI) 28.0-28.9, adult: Secondary | ICD-10-CM | POA: Insufficient documentation

## 2016-06-03 DIAGNOSIS — Z7409 Other reduced mobility: Secondary | ICD-10-CM | POA: Diagnosis not present

## 2016-06-03 DIAGNOSIS — I10 Essential (primary) hypertension: Secondary | ICD-10-CM | POA: Diagnosis not present

## 2016-06-03 DIAGNOSIS — G459 Transient cerebral ischemic attack, unspecified: Secondary | ICD-10-CM | POA: Diagnosis not present

## 2016-06-03 DIAGNOSIS — Z79810 Long term (current) use of selective estrogen receptor modulators (SERMs): Secondary | ICD-10-CM | POA: Insufficient documentation

## 2016-06-03 DIAGNOSIS — Z96651 Presence of right artificial knee joint: Secondary | ICD-10-CM | POA: Insufficient documentation

## 2016-06-03 DIAGNOSIS — Z79899 Other long term (current) drug therapy: Secondary | ICD-10-CM | POA: Diagnosis not present

## 2016-06-03 DIAGNOSIS — R4781 Slurred speech: Secondary | ICD-10-CM | POA: Diagnosis present

## 2016-06-03 DIAGNOSIS — F419 Anxiety disorder, unspecified: Secondary | ICD-10-CM | POA: Insufficient documentation

## 2016-06-03 DIAGNOSIS — G8929 Other chronic pain: Secondary | ICD-10-CM | POA: Diagnosis not present

## 2016-06-03 HISTORY — DX: Transient cerebral ischemic attack, unspecified: G45.9

## 2016-06-03 LAB — COMPREHENSIVE METABOLIC PANEL
ALBUMIN: 3.2 g/dL — AB (ref 3.5–5.0)
ALK PHOS: 42 U/L (ref 38–126)
ALT: 12 U/L — ABNORMAL LOW (ref 14–54)
ANION GAP: 9 (ref 5–15)
AST: 20 U/L (ref 15–41)
BUN: 16 mg/dL (ref 6–20)
CHLORIDE: 99 mmol/L — AB (ref 101–111)
CO2: 26 mmol/L (ref 22–32)
Calcium: 8.9 mg/dL (ref 8.9–10.3)
Creatinine, Ser: 0.79 mg/dL (ref 0.44–1.00)
GFR calc non Af Amer: >60 mL/min (ref >60)
GLUCOSE: 94 mg/dL (ref 65–99)
Potassium: 4.2 mmol/L (ref 3.5–5.1)
SODIUM: 134 mmol/L — AB (ref 135–145)
Total Bilirubin: 0.6 mg/dL (ref 0.3–1.2)
Total Protein: 6 g/dL — ABNORMAL LOW (ref 6.5–8.1)

## 2016-06-03 LAB — CBC
HCT: 35.2 % — ABNORMAL LOW (ref 36.0–46.0)
Hemoglobin: 11.8 g/dL — ABNORMAL LOW (ref 12.0–15.0)
MCH: 31.4 pg (ref 26.0–34.0)
MCHC: 33.5 g/dL (ref 30.0–36.0)
MCV: 93.6 fL (ref 78.0–100.0)
PLATELETS: 162 10*3/uL (ref 150–400)
RBC: 3.76 MIL/uL — AB (ref 3.87–5.11)
RDW: 13.1 % (ref 11.5–15.5)
WBC: 9.1 10*3/uL (ref 4.0–10.5)

## 2016-06-03 LAB — DIFFERENTIAL
BASOS PCT: 1 %
Basophils Absolute: 0.1 10*3/uL (ref 0.0–0.1)
EOS PCT: 2 %
Eosinophils Absolute: 0.2 10*3/uL (ref 0.0–0.7)
LYMPHS PCT: 18 %
Lymphs Abs: 1.6 10*3/uL (ref 0.7–4.0)
MONO ABS: 0.6 10*3/uL (ref 0.1–1.0)
Monocytes Relative: 7 %
NEUTROS ABS: 6.6 10*3/uL (ref 1.7–7.7)
NEUTROS PCT: 72 %

## 2016-06-03 LAB — PROTIME-INR
INR: 1.04
PROTHROMBIN TIME: 13.6 s (ref 11.4–15.2)

## 2016-06-03 LAB — I-STAT TROPONIN, ED: Troponin i, poc: 0.01 ng/mL (ref 0.00–0.08)

## 2016-06-03 LAB — APTT: aPTT: 30 seconds (ref 24–36)

## 2016-06-03 MED ORDER — SODIUM CHLORIDE 0.9% FLUSH
3.0000 mL | Freq: Two times a day (BID) | INTRAVENOUS | Status: DC
  Administered 2016-06-03 – 2016-06-05 (×4): 3 mL via INTRAVENOUS

## 2016-06-03 MED ORDER — RALOXIFENE HCL 60 MG PO TABS
60.0000 mg | ORAL_TABLET | Freq: Every day | ORAL | Status: DC
  Administered 2016-06-04 – 2016-06-06 (×3): 60 mg via ORAL
  Filled 2016-06-03 (×3): qty 1

## 2016-06-03 MED ORDER — ONDANSETRON HCL 4 MG PO TABS: 4.0000 mg | ORAL_TABLET | Freq: Four times a day (QID) | ORAL | Status: DC | PRN

## 2016-06-03 MED ORDER — HYOSCYAMINE SULFATE 0.125 MG PO TBDP
0.1250 mg | ORAL_TABLET | Freq: Three times a day (TID) | ORAL | Status: DC | PRN
  Filled 2016-06-03: qty 1

## 2016-06-03 MED ORDER — LORAZEPAM 2 MG/ML IJ SOLN
1.0000 mg | Freq: Once | INTRAMUSCULAR | Status: AC
  Administered 2016-06-04: 1 mg via INTRAVENOUS
  Filled 2016-06-03: qty 1

## 2016-06-03 MED ORDER — CARISOPRODOL 350 MG PO TABS: 350.0000 mg | ORAL_TABLET | Freq: Every evening | ORAL | Status: DC | PRN

## 2016-06-03 MED ORDER — ACETAMINOPHEN 650 MG RE SUPP: 650.0000 mg | Freq: Four times a day (QID) | RECTAL | Status: DC | PRN

## 2016-06-03 MED ORDER — ATENOLOL 25 MG PO TABS
50.0000 mg | ORAL_TABLET | Freq: Every day | ORAL | Status: DC
  Administered 2016-06-04 – 2016-06-06 (×3): 50 mg via ORAL
  Filled 2016-06-03 (×3): qty 2

## 2016-06-03 MED ORDER — GABAPENTIN 400 MG PO CAPS
800.0000 mg | ORAL_CAPSULE | Freq: Three times a day (TID) | ORAL | Status: DC
  Administered 2016-06-03 – 2016-06-06 (×8): 800 mg via ORAL
  Filled 2016-06-03 (×7): qty 2

## 2016-06-03 MED ORDER — ASPIRIN EC 81 MG PO TBEC
81.0000 mg | DELAYED_RELEASE_TABLET | Freq: Every day | ORAL | Status: DC
Start: 2016-06-04 — End: 2016-06-06
  Administered 2016-06-04 – 2016-06-06 (×3): 81 mg via ORAL
  Filled 2016-06-03 (×3): qty 1

## 2016-06-03 MED ORDER — DOXAZOSIN MESYLATE 2 MG PO TABS
2.0000 mg | ORAL_TABLET | Freq: Every day | ORAL | Status: DC
  Administered 2016-06-04 – 2016-06-06 (×3): 2 mg via ORAL
  Filled 2016-06-03 (×3): qty 1

## 2016-06-03 MED ORDER — ALPRAZOLAM 0.5 MG PO TABS
0.5000 mg | ORAL_TABLET | Freq: Every evening | ORAL | Status: DC | PRN
  Administered 2016-06-03 – 2016-06-05 (×3): 0.5 mg via ORAL
  Filled 2016-06-03 (×3): qty 1

## 2016-06-03 MED ORDER — ONDANSETRON HCL 4 MG/2ML IJ SOLN: 4.0000 mg | Freq: Four times a day (QID) | INTRAMUSCULAR | Status: DC | PRN

## 2016-06-03 MED ORDER — SENNOSIDES-DOCUSATE SODIUM 8.6-50 MG PO TABS
1.0000 | ORAL_TABLET | Freq: Every day | ORAL | Status: DC
  Filled 2016-06-03 (×3): qty 1

## 2016-06-03 MED ORDER — ACETAMINOPHEN 325 MG PO TABS: 650.0000 mg | ORAL_TABLET | Freq: Four times a day (QID) | ORAL | Status: DC | PRN

## 2016-06-03 MED ORDER — TRAMADOL HCL 50 MG PO TABS: 50.0000 mg | ORAL_TABLET | Freq: Two times a day (BID) | ORAL | Status: DC | PRN

## 2016-06-03 MED ORDER — STROKE: EARLY STAGES OF RECOVERY BOOK
Freq: Once | Status: AC
  Administered 2016-06-03: 20:00:00

## 2016-06-03 MED ORDER — HEPARIN SODIUM (PORCINE) 5000 UNIT/ML IJ SOLN
5000.0000 [IU] | Freq: Three times a day (TID) | INTRAMUSCULAR | Status: DC
  Administered 2016-06-03 – 2016-06-06 (×8): 5000 [IU] via SUBCUTANEOUS
  Filled 2016-06-03 (×8): qty 1

## 2016-06-03 NOTE — ED Provider Notes (Signed)
[facility] DEPT Provider Note   CSN: [id number] Arrival date & time: 06/03/16  1052     History   Chief Complaint No chief complaint on file.   HPI Elizabeth Mullins is a 83 y.o. female.  Patient is an 83 year old female with a history of hypertension who presents today from PCP office for slurred speech. Patient states she felt normal when she went to bed last night but when she woke up this morning her speech was slurred. Daughter also states she was having difficulty walking with some mild ataxia grabbing the wall to steady herself. Gait difficulty improved by the time she got to the doctor and upon arrival here slurred speech has significantly improved. Patient denies any difficulty swallowing but she did admit to clearing her throat multiple times when she was drinking her coffee this morning. Unilateral numbness or weakness. No aphasia. She denies any chest pain, shortness of breath, abdominal pain, visual changes. No symptoms similar to this. No recent medication changes. Patient felt normal when she went to bed last night at 11   The history is provided by the patient and a relative.    Past Medical History:  Diagnosis Date  . Allergic headache   . Arthritis    "all over"  . Chronic back pain    "top of my neck to bottom of my feet" (08/25/2015)  . Diverticulitis   . H/O drainage of abscess    left buttocks area -closed drainage bag in place 10-12-15   . Hypertension   . Impaired mobility    "cervical neck- range of motion limitations"  . Scoliosis     Patient Active Problem List   Diagnosis Date Noted  . Cervical pain (neck) 10/19/2015  . Diverticulitis of large intestine with perforation and abscess without bleeding 10/18/2015  . Anxiety 10/12/2015  . Diverticulitis of colon with perforation 08/24/2015  . Abnormal urinalysis 08/24/2015  . ?? Pancreatic mass 08/24/2015  . IBS (irritable bowel syndrome) 08/24/2015  . Inflammatory arthropathy 08/24/2015  . Chronic  back pain 08/24/2015  . Cochlear hearing loss, bilateral 06/08/2015  . Diverticulitis large intestine 05/25/2015  . DD (diverticular disease) 10/31/2014  . Generalized OA 10/31/2014  . Headache disorder 10/25/2014  . Neoplasm of meninges 10/25/2014  . Essential (primary) hypertension 10/04/2013  . Excess weight 10/04/2013  . Degeneration of intervertebral disc of lumbar region 09/22/2013  . Lumbar radiculopathy 09/22/2013    Past Surgical History:  Procedure Laterality Date  . APPENDECTOMY  1960s  . CT GUIDE ABSCESS DRAINAGE  ([place] HX)  08/25/2015   pelvic diverticular abscess  . DILATION AND CURETTAGE OF UTERUS    . IR GENERIC HISTORICAL  09/20/2015   IR RADIOLOGIST EVAL & MGMT 09/20/2015 Monia Sabal, PA-C [facility] INTERV RAD  . IR GENERIC HISTORICAL  10/03/2015   IR RADIOLOGIST EVAL & MGMT 10/03/2015 [facility] INTERV RAD  . JOINT REPLACEMENT    . TONSILLECTOMY    . TOTAL KNEE ARTHROPLASTY Right   . VAGINAL HYSTERECTOMY  1973   "partial"    OB History    No data available       Home Medications    Prior to Admission medications   Medication Sig Start Date End Date Taking? Authorizing Provider  ALPRAZolam Duanne Moron) 0.5 MG tablet Take 0.5 mg by mouth 3 (three) times daily as needed for anxiety.    Historical Provider, MD  atenolol (TENORMIN) 50 MG tablet Take 50 mg by mouth daily.    Historical Provider, MD  carisoprodol (  SOMA) 350 MG tablet Take 350 mg by mouth 4 (four) times daily as needed for muscle spasms.    Historical Provider, MD  doxazosin (CARDURA) 2 MG tablet Take 1 tablet (2 mg total) by mouth daily. 10/30/15   Michael Boston, MD  gabapentin (NEURONTIN) 800 MG tablet Take 800 mg by mouth 3 (three) times daily.    Historical Provider, MD  HYDROmorphone (DILAUDID) 4 MG tablet Take 0.5-2 tablets (2-8 mg total) by mouth every 6 (six) hours as needed for moderate pain or severe pain. 10/23/15   Michael Boston, MD  hyoscyamine (ANASPAZ) 0.125 MG TBDP disintergrating tablet Place 0.125  mg under the tongue 3 (three) times daily as needed for cramping.     Historical Provider, MD  ondansetron (ZOFRAN-ODT) 4 MG disintegrating tablet Take 4 mg by mouth 3 (three) times daily as needed for nausea or vomiting.  09/28/15   Historical Provider, MD  raloxifene (EVISTA) 60 MG tablet Take 60 mg by mouth daily.    Historical Provider, MD  thiamine (VITAMIN B-1) 100 MG tablet Take 100 mg by mouth daily.    Historical Provider, MD  vitamin E (VITAMIN E) 400 UNIT capsule Take 400 Units by mouth daily.    Historical Provider, MD    Family History History reviewed. No pertinent family history.  Social History Social History  Substance Use Topics  . Smoking status: Never Smoker  . Smokeless tobacco: Never Used  . Alcohol use No     Allergies   Codeine; Demerol [meperidine]; Morphine and related; Nsaids; and Oxycodone   Review of Systems Review of Systems  All other systems reviewed and are negative.    Physical Exam Updated Vital Signs BP 139/64   Pulse 70   Temp 97.9 F (36.6 C) (Oral)   Resp 20   Ht 4\' 11"  (1.499 m)   Wt 138 lb (62.6 kg)   SpO2 97%   BMI 27.87 kg/m   Physical Exam  Constitutional: She is oriented to person, place, and time. She appears well-developed and well-nourished. No distress.  HENT:  Head: Normocephalic and atraumatic.  Mouth/Throat: Oropharynx is clear and moist.  Eyes: Conjunctivae and EOM are normal. Pupils are equal, round, and reactive to light.  Neck: Normal range of motion. Neck supple.  Cardiovascular: Normal rate, regular rhythm and intact distal pulses.   No murmur heard. Pulmonary/Chest: Effort normal and breath sounds normal. No respiratory distress. She has no wheezes. She has no rales.  Abdominal: Soft. She exhibits no distension. There is no tenderness. There is no rebound and no guarding.  Musculoskeletal: Normal range of motion. She exhibits no edema or tenderness.  Neurological: She is alert and oriented to person,  place, and time. She has normal strength. No cranial nerve deficit or sensory deficit. Coordination normal.  5 out of 5 strength in upper and lower extremities bilaterally. Normal heel-to-shin on both legs. No pronator drift.  Patient's speech is clear but will occasionally have very minor difficulty on some words. No aphasia noted. No visual field cuts.  Skin: Skin is warm and dry. No rash noted. No erythema.  Psychiatric: She has a normal mood and affect. Her behavior is normal.  Nursing note and vitals reviewed.    ED Treatments / Results  Labs (all labs ordered are listed, but only abnormal results are displayed) Labs Reviewed  CBC - Abnormal; Notable for the following:       Result Value   RBC 3.76 (*)    Hemoglobin 11.8 (*)  HCT 35.2 (*)    All other components within normal limits  COMPREHENSIVE METABOLIC PANEL - Abnormal; Notable for the following:    Sodium 134 (*)    Chloride 99 (*)    Total Protein 6.0 (*)    Albumin 3.2 (*)    ALT 12 (*)    All other components within normal limits  PROTIME-INR  APTT  DIFFERENTIAL  HEMOGLOBIN A1C  LIPID PANEL  CBC  COMPREHENSIVE METABOLIC PANEL  I-STAT TROPOININ, ED    EKG  EKG Interpretation  Date/Time:  Monday June 03 2016 10:57:42 EST Ventricular Rate:  74 PR Interval:    QRS Duration: 105 QT Interval:  392 QTC Calculation: 435 R Axis:   56 Text Interpretation:  Sinus rhythm Low voltage, precordial leads No significant change since last tracing Confirmed by Maryan Rued  MD, Loree Fee ([id number]) on 06/03/2016 11:03:37 AM       Radiology Ct Head Wo Contrast  Result Date: 06/03/2016 CLINICAL DATA:  83 year old female with dizziness slurred speech and unsteady gait this morning. Symptoms now resolved. Initial encounter. EXAM: CT HEAD WITHOUT CONTRAST TECHNIQUE: Contiguous axial images were obtained from the base of the skull through the vertex without intravenous contrast. COMPARISON:  None. FINDINGS: Brain: Densely  calcified 15 mm right anterior frontal convexity round extra-axial mass is probably a meningioma (series 2 image 21 and coronal image 16). Minimal mass effect on the adjacent anterior right frontal lobe. No associated cerebral edema. Underlying cerebral volume is normal for age. No midline shift, ventriculomegaly, intracranial hemorrhage or evidence of cortically based acute infarction. Gray-white matter differentiation is within normal limits throughout the brain. No cortical encephalomalacia. Vascular: Extensive ICA siphon calcified atherosclerosis at the skullbase. No suspicious intracranial vascular hyperdensity. Skull: Mild hyperostosis of the calvarium. Mildly heterogeneous bone mineralization throughout the skull. No suspicious osseous lesion identified. Sinuses/Orbits: Mild maxillary sinus mucosal thickening, otherwise well pneumatized. Other: No acute orbit or scalp soft tissue findings. IMPRESSION: 1. Largely unremarkable for age noncontrast CT appearance of the brain. 2. A 15 mm right anterior convexity partially calcified masses probably a meningioma. No associated cerebral edema and I favor this is clinically silent. 3. Mild maxillary sinus inflammation. Electronically Signed   By: Genevie Ann M.D.   On: 06/03/2016 13:06    Procedures Procedures (including critical care time)  Medications Ordered in ED Medications - No data to display   Initial Impression / Assessment and Plan / ED Course  I have reviewed the triage vital signs and the nursing notes.  Pertinent labs & imaging results that were available during my care of the patient were reviewed by me and considered in my medical decision making (see chart for details).  Clinical Course    Patient presenting today from PCPs office for slurred speech. Patient's symptoms have almost completely resolved. She received an NIH of 1 for occasional difficulty enunciating certain words. She has no other focal defects. She has no chest pain or  shortness of breath. Low suspicion for dissection, PE or cardiac cause. She has no signs of atrial fibrillation on EKG. Patient's symptoms are concerning for TIA. Risk factors include age and hypertension. Currently blood pressure is 129/95. The patient will need a TIA workup. Order set initiated. Labs and imaging neg.  Final Clinical Impressions(s) / ED Diagnoses   Final diagnoses:  Transient cerebral ischemia, unspecified type    New Prescriptions New Prescriptions   No medications on file     Blanchie Dessert, MD 06/03/16 1925

## 2016-06-03 NOTE — ED Notes (Signed)
Called for heart healthy diet tray for pt

## 2016-06-03 NOTE — ED Notes (Signed)
EKG given to Dr. Maryan Rued

## 2016-06-03 NOTE — ED Triage Notes (Signed)
Pt arrives EMS from MD office. Pt woke with slurred speech at 0730 noted by her and daughter. LSN at bedtime last night 11pm last night. EMS states speech improved since initial assessment.

## 2016-06-03 NOTE — ED Notes (Signed)
Attempted report x1. 

## 2016-06-03 NOTE — H&P (Signed)
Date: 06/03/2016               Patient Name:  Elizabeth Mullins MRN: [id number]  DOB: February 10, 1932 Age / Sex: 83 y.o., female   PCP: Loraine Leriche, MD         Medical Service: Internal Medicine Teaching Service         Attending Physician: Dr. Sid Falcon, MD    First Contact: Dr. Asencion Partridge Pager: [phone]  Second Contact: Dr. Burgess Estelle Pager: [phone]       After Hours (After 5p/  First Contact Pager: [phone]  weekends / holidays): Second Contact Pager: [phone]   Chief Complaint: TIA  History of Present Illness: Elizabeth Mullins is an 83 y.o. woman with PMH HTN, obesity who presents today from PCP office for slurred speech and imbalance first noted by her daughter at [place] and seemed to resolve by 10AM. She normal at bedtime last night 11pm. She lives with her daughter in [place] and is independent and ambulatory with a cane at baseline. Her daughter noticed her mumbling and slurring her words this morning when she woke up and Ms. Venturo recalls that her tongue "felt heavy." She also noticed her seeming off-balance and had to use furniture, walls, etc to steady herself while walking at home. She had a regular PCP follow up visit today and the clinic nurse also noticed the patient slurring her words and called EMS to bring her to the ED. She denies previous episodes similar, denies weakness, numbness, confusion, recent illness, difficulty eating, urinary symptoms, or bowel symptoms. She denies recent medications changes but endorses recent increased difficulty controlling her BP in recent weeks, previously stable at 130s/60s but now with fluctuations as low as SBP 90s and high as 200 with minimal symptoms (dizzy). She also has noticed her heart racing a few times recently. Denies dyspnea, chest pain, abdominal pain, vision changes, dysphagia.   In the ED afebrile, HR 70, RR 20, BP 130/71, SpO2 100% on RA arrived with intact neuro exam on ED evaluation. Labs and EKG unremarkable and CT  head wo contrast was normal, 21mm calcified mass likely meningioma seen and known to patient. IMTS contacted for admission for TIA workup.  Meds:  Current Meds  Medication Sig  . Ascorbic Acid (VITAMIN C) 100 MG tablet Take 250 mg by mouth daily.  Marland Kitchen atenolol (TENORMIN) 50 MG tablet Take 50 mg by mouth daily.  . carisoprodol (SOMA) 350 MG tablet Take 350 mg by mouth 4 (four) times daily as needed for muscle spasms.  Marland Kitchen doxazosin (CARDURA) 2 MG tablet Take 1 tablet (2 mg total) by mouth daily.  Marland Kitchen gabapentin (NEURONTIN) 800 MG tablet Take 800 mg by mouth 3 (three) times daily.  . hyoscyamine (ANASPAZ) 0.125 MG TBDP disintergrating tablet Place 0.125 mg under the tongue 3 (three) times daily as needed for cramping.   . raloxifene (EVISTA) 60 MG tablet Take 60 mg by mouth daily.  . Red Yeast Rice 600 MG CAPS Take 600 mg by mouth daily.  Marland Kitchen thiamine (VITAMIN B-1) 100 MG tablet Take 100 mg by mouth daily.  . traMADol (ULTRAM) 50 MG tablet Take 50 mg by mouth every 8 (eight) hours as needed.  . vitamin E (VITAMIN E) 400 UNIT capsule Take 400 Units by mouth daily.    Allergies: Allergies as of 06/03/2016 - Review Complete 06/03/2016  Allergen Reaction Noted  . Valsartan-hydrochlorothiazide  01/27/2015  . Codeine Nausea And Vomiting and Other (See Comments)  07/16/2015  . Demerol [meperidine] Nausea And Vomiting and Other (See Comments) 07/16/2015  . Morphine and related Nausea And Vomiting and Other (See Comments) 07/16/2015  . Nsaids Nausea And Vomiting and Other (See Comments) 07/16/2015  . Oxycodone Other (See Comments) 10/03/2015   Past Medical History:  Diagnosis Date  . Allergic headache   . Arthritis    "all over"  . Chronic back pain    "top of my neck to bottom of my feet" (08/25/2015)  . Diverticulitis   . H/O drainage of abscess    left buttocks area -closed drainage bag in place 10-12-15   . Hypertension   . Impaired mobility    "cervical neck- range of motion limitations"  .  Scoliosis     Family History:  History reviewed. No pertinent family history.    Social History:  Social History   Social History  . Marital status: Widowed    Spouse name: N/A  . Number of children: N/A  . Years of education: N/A   Occupational History  . Not on file.   Social History Main Topics  . Smoking status: Never Smoker  . Smokeless tobacco: Never Used  . Alcohol use No  . Drug use: No  . Sexual activity: No   Other Topics Concern  . Not on file   Social History Narrative   Lives with daughter in [place], [place] who has been staying with her since mid 2017. Both originally from [place], [place], moved in 2014-2015.    Review of Systems: A complete ROS was negative except as per HPI.   Physical Exam: Blood pressure 106/91, pulse 81, temperature 97.9 F (36.6 C), temperature source Oral, resp. rate 14, height 4\' 11"  (1.499 m), weight 138 lb (62.6 kg), SpO2 98 %.  General appearance: Elderly woman resting comfortably in bed, in no distress, pleasant and conversation, no aphasia HENT: Normocephalic, atraumatic, moist mucous membranes Eyes: PERRL, EOM inact, non-icteric Cardiovascular: Regular rate and rhythm, no murmurs, rubs, gallops Respiratory: Clear to auscultation bilaterally, normal work of breathing Abdomen: Obese, BS+, soft, non-tender, non-distended, right sided reducible incisional hernia Extremities: Normal bulk and range of motion, trace edema of BLEs, 2+ peripheral pulses, left knee mildly tender to palpation Skin: Warm, dry, intact Neuro: Alert and oriented, cranial nerves grossly intact, strength and sensation intact, FNF intact bilaterally, mild hand tremor present, gait deferred Psych: Appropriate affect, clear speech, thoughts linear and goal-directed  Assessment & Plan by Problem:  Presumed TIA, transient episode of dysarthria and imbalance this morning, full recollection of events. [place] risk factors include HTN, age, and obesity. CT head  without acute abnormality. Will admit for TIA workup. - Brain MRI wo contrast - Carotid doppler US - TTE - Telemetry, neuro checks - Check Lipid panel, TSH, A1c - Optimize management of hypertension, likely HLD - Start Aspirin 81 mg tomorrow - PT OT eval and treat  Hypertension, currently normotensive as baseline BP, continue home Atenolol 50mg , Doxazosin 2mg   Osteoarthritis pain, chronic - Tylenol PRN, continue home Gabapentin 800mg  TID, Tramdol 50mg  BID PRN  Chronic back pain, scoliosis - home Carisoprodol (Soma) 350mg  QHS PRN, continue home Raloxifene  GI upset - continue home Hyoscyamine TID PRN for cramping  Insomnia - continue home Xanax QHS PRN   FEN/GI: HH diet, replete electrolytes as needed  DVT ppx: Heparin TID  Code status: Full code  Dispo: Admit patient to Observation with expected length of stay less than 2 midnights.  Signed: Asencion Partridge, MD 06/03/2016, 9:36  PM  Pager: [phone]

## 2016-06-03 NOTE — Progress Notes (Signed)
Received from the ED via bed; oriented patient and her daughter to room and unit routine. Patient very cooperative; denies any pain. Request to be independent in the room; fall safety explained; will sign off independent with 3 observations of her gait.

## 2016-06-03 NOTE — ED Notes (Signed)
Pt ambulatory to restroom with 1 stand-by assist

## 2016-06-04 ENCOUNTER — Observation Stay (HOSPITAL_BASED_OUTPATIENT_CLINIC_OR_DEPARTMENT_OTHER): Payer: Medicare Other

## 2016-06-04 ENCOUNTER — Observation Stay (HOSPITAL_COMMUNITY): Payer: Medicare Other

## 2016-06-04 DIAGNOSIS — I10 Essential (primary) hypertension: Secondary | ICD-10-CM | POA: Diagnosis not present

## 2016-06-04 DIAGNOSIS — G459 Transient cerebral ischemic attack, unspecified: Secondary | ICD-10-CM | POA: Diagnosis not present

## 2016-06-04 DIAGNOSIS — G458 Other transient cerebral ischemic attacks and related syndromes: Secondary | ICD-10-CM | POA: Diagnosis not present

## 2016-06-04 LAB — VAS US CAROTID
LCCADSYS: 74 cm/s
LEFT ECA DIAS: -14 cm/s
LEFT VERTEBRAL DIAS: 15 cm/s
LICADDIAS: -23 cm/s
LICAPDIAS: 18 cm/s
LICAPSYS: 64 cm/s
Left CCA dist dias: 19 cm/s
Left CCA prox dias: -25 cm/s
Left CCA prox sys: -127 cm/s
Left ICA dist sys: -80 cm/s
RCCAPDIAS: 19 cm/s
RCCAPSYS: 79 cm/s
RIGHT ECA DIAS: -18 cm/s
RIGHT VERTEBRAL DIAS: 17 cm/s
Right cca dist sys: -65 cm/s

## 2016-06-04 LAB — COMPREHENSIVE METABOLIC PANEL
ALT: 12 U/L — ABNORMAL LOW (ref 14–54)
AST: 20 U/L (ref 15–41)
Albumin: 3.4 g/dL — ABNORMAL LOW (ref 3.5–5.0)
Alkaline Phosphatase: 44 U/L (ref 38–126)
Anion gap: 7 (ref 5–15)
BILIRUBIN TOTAL: 0.4 mg/dL (ref 0.3–1.2)
BUN: 15 mg/dL (ref 6–20)
CHLORIDE: 104 mmol/L (ref 101–111)
CO2: 26 mmol/L (ref 22–32)
Calcium: 9.2 mg/dL (ref 8.9–10.3)
Creatinine, Ser: 0.7 mg/dL (ref 0.44–1.00)
Glucose, Bld: 103 mg/dL — ABNORMAL HIGH (ref 65–99)
POTASSIUM: 3.9 mmol/L (ref 3.5–5.1)
Sodium: 137 mmol/L (ref 135–145)
TOTAL PROTEIN: 6.1 g/dL — AB (ref 6.5–8.1)

## 2016-06-04 LAB — CBC
HEMATOCRIT: 37.7 % (ref 36.0–46.0)
Hemoglobin: 12.5 g/dL (ref 12.0–15.0)
MCH: 30.9 pg (ref 26.0–34.0)
MCHC: 33.2 g/dL (ref 30.0–36.0)
MCV: 93.3 fL (ref 78.0–100.0)
PLATELETS: 179 10*3/uL (ref 150–400)
RBC: 4.04 MIL/uL (ref 3.87–5.11)
RDW: 13.5 % (ref 11.5–15.5)
WBC: 8.3 10*3/uL (ref 4.0–10.5)

## 2016-06-04 LAB — LIPID PANEL
CHOL/HDL RATIO: 1.9 ratio
CHOLESTEROL: 168 mg/dL (ref 0–200)
HDL: 90 mg/dL (ref >40)
LDL Cholesterol: 68 mg/dL (ref 0–99)
Triglycerides: 49 mg/dL (ref <150)
VLDL: 10 mg/dL (ref 0–40)

## 2016-06-04 LAB — ECHOCARDIOGRAM COMPLETE
Height: 59 in
WEIGHTICAEL: 2208 [oz_av]

## 2016-06-04 MED ORDER — LISINOPRIL 10 MG PO TABS
10.0000 mg | ORAL_TABLET | Freq: Every day | ORAL | Status: DC
  Administered 2016-06-04 – 2016-06-06 (×3): 10 mg via ORAL
  Filled 2016-06-04 (×2): qty 1
  Filled 2016-06-04: qty 2

## 2016-06-04 MED ORDER — LISINOPRIL 5 MG PO TABS: 5.0000 mg | ORAL_TABLET | Freq: Every day | ORAL | Status: DC

## 2016-06-04 MED ORDER — MENTHOL 3 MG MT LOZG
1.0000 | LOZENGE | OROMUCOSAL | Status: DC | PRN
  Filled 2016-06-04: qty 9

## 2016-06-04 MED ORDER — LORAZEPAM 1 MG PO TABS
1.0000 mg | ORAL_TABLET | Freq: Once | ORAL | Status: DC | PRN
Start: 2016-06-04 — End: 2016-06-06
  Filled 2016-06-04: qty 1

## 2016-06-04 NOTE — Evaluation (Signed)
Speech Language Pathology Evaluation Patient Details Name: Elizabeth Mullins MRN: [id number] DOB: 05-26-1931 Today's Date: 06/04/2016 Time: [id number] SLP Time Calculation (min) (ACUTE ONLY): 25 min  Problem List:  Patient Active Problem List   Diagnosis Date Noted  . TIA (transient ischemic attack) 06/03/2016  . Cervical pain (neck) 10/19/2015  . Diverticulitis of large intestine with perforation and abscess without bleeding 10/18/2015  . Anxiety 10/12/2015  . Diverticulitis of colon with perforation 08/24/2015  . Abnormal urinalysis 08/24/2015  . ?? Pancreatic mass 08/24/2015  . IBS (irritable bowel syndrome) 08/24/2015  . Inflammatory arthropathy 08/24/2015  . Chronic back pain 08/24/2015  . Cochlear hearing loss, bilateral 06/08/2015  . Diverticulitis large intestine 05/25/2015  . DD (diverticular disease) 10/31/2014  . Generalized OA 10/31/2014  . Headache disorder 10/25/2014  . Neoplasm of meninges 10/25/2014  . Essential (primary) hypertension 10/04/2013  . Excess weight 10/04/2013  . Degeneration of intervertebral disc of lumbar region 09/22/2013  . Lumbar radiculopathy 09/22/2013   Past Medical History:  Past Medical History:  Diagnosis Date  . Allergic headache   . Arthritis    "all over"  . Chronic back pain    "top of my neck to bottom of my feet" (08/25/2015)  . Diverticulitis   . H/O drainage of abscess    left buttocks area -closed drainage bag in place 10-12-15   . Hypertension   . Impaired mobility    "cervical neck- range of motion limitations"  . Scoliosis    Past Surgical History:  Past Surgical History:  Procedure Laterality Date  . APPENDECTOMY  1960s  . CT GUIDE ABSCESS DRAINAGE  ([place] HX)  08/25/2015   pelvic diverticular abscess  . DILATION AND CURETTAGE OF UTERUS    . IR GENERIC HISTORICAL  09/20/2015   IR RADIOLOGIST EVAL & MGMT 09/20/2015 Monia Sabal, PA-C [facility] INTER[facility] RAD  . IR GENERIC HISTORICAL  10/03/2015   IR RADIOLOGIST EVAL & MGMT 10/03/2015  [facility]  . JOINT REPLACEMENT    . TONSILLECTOMY    . TOTAL KNEE ARTHROPLASTY Right   . VAGINAL HYSTERECTOMY  1973   "partial"   HPI:  Pt is an 83 y.o. female who presented to the ED after her family as well as staff at primary care office noticed mumbling and slurring of words. She reports that her tongue "felt heavy." CT unremarkable. Pt has a PMH significant for arthritis, chronic back pain, diverticulitis, hypertension, impaired mobility, and scoliosis.   Assessment / Plan / Recommendation Clinical Impression  Pt scored a 20/30 on the MoCA, with cognitive impairments most noteable in the areas of sustained attention, working memory, and complex problem solving. Pt and daughter present are both in agreement that this is her baseline level of function, and that she has had gradual changes to her mentation over time - nothing acute. Recommend 24/7 supervision upon discharge home, with no further acute SLP needs identified.    SLP Assessment  Patient does not need any further Speech Lanaguage Pathology Services    Follow Up Recommendations  24 hour supervision/assistance    Frequency and Duration           SLP Evaluation Cognition  Overall Cognitive Status: History of cognitive impairments - at baseline       Comprehension  Auditory Comprehension Overall Auditory Comprehension: Impaired at baseline    Expression Expression Primary Mode of Expression: Verbal Verbal Expression Overall Verbal Expression: Appears within functional limits for tasks assessed   Oral / Motor  Motor  Speech Overall Motor Speech: Other (comment) (hoarse voice, pt says she has "laryngitis")   GO          Functional Assessment Tool Used: skilled clinical judgment Functional Limitations: Attention Attention Current Status [id number]): At least 20 percent but less than 40 percent impaired, limited or restricted Attention Goal Status [id number]): At least 20 percent but less than 40 percent impaired,  limited or restricted Attention Discharge Status [phone]): At least 20 percent but less than 40 percent impaired, limited or restricted         Germain Osgood 06/04/2016, 3:12 PM  Germain Osgood, M.A. CCC-SLP [phone]

## 2016-06-04 NOTE — Progress Notes (Signed)
Preliminary results by tech - Carotid Duplex Completed. No evidence of a stenosis noted in bilateral carotid arteries. Vertebral arteries demonstrated antegrade flow.  Oda Cogan, BS, RDMS, RVT

## 2016-06-04 NOTE — Progress Notes (Signed)
Subjective: Elizabeth Mullins is doing well this morning. No acute complaints, mentions her room is a little too warm but has been up and walking in her room. Pleasant and conversational, no neuro deficits. Recalls undergoing carotid dopplers earlier in the morning and planned for echo soon. Mentions that she gets claustrophobic with MRIs and wanted to make sure she could get a medication for anxiety.  Review of telemetry reveals no documented arrhythmias overnight.   Objective: Vital signs in last 24 hours: Vitals:   06/04/16 0000 06/04/16 0210 06/04/16 0407 06/04/16 0759  BP: (!) 170/75 (!) 178/83 (!) 183/75 (!) 173/89  Pulse:  87 87   Resp: 16 17 17 16   Temp: 97.9 F (36.6 C) 97.8 F (36.6 C) 98 F (36.7 C) 98.1 F (36.7 C)  TempSrc: Oral Oral Oral   SpO2: 99% 97% 100% 99%  Weight:      Height:       No intake or output data in the 24 hours ending 06/04/16 1147  Physical Exam General appearance: Elderly woman resting comfortably in bed, in no distress, pleasant and conversational,  HENT: Normocephalic, moist mucous membranes Eyes: PERRL, EOM inact, non-icteric Cardiovascular: Regular rate and rhythm, no murmurs, rubs, gallops Respiratory: Clear to auscultation bilaterally, normal work of breathing Abdomen: Obese, BS+, soft, non-tender, non-distended, right sided reducible incisional hernia Extremities: Normal bulk and range of motion, trace edema of BLEs, 2+ peripheral pulses Skin: Warm, dry, intact Neuro: Alert and oriented, cranial nerves grossly intact, strength and sensation intact, FNF/HKS intact bilaterally, rapid alternating movement intact, ambulatory in room without difficulty Psych: Appropriate affect, clear speech, thoughts linear and goal-directed  Labs / Imaging / Procedures: CBC Latest Ref Rng & Units 06/04/2016 06/03/2016 10/22/2015  WBC 4.0 - 10.5 K/uL 8.3 9.1 10.3  Hemoglobin 12.0 - 15.0 g/dL 12.5 11.8(L) 8.3(L)  Hematocrit 36.0 - 46.0 % 37.7 35.2(L) 24.8(L)    Platelets 150 - 400 K/uL 179 162 287   BMP Latest Ref Rng & Units 06/04/2016 06/03/2016 10/21/2015  Glucose 65 - 99 mg/dL 103(H) 94 -  BUN 6 - 20 mg/dL 15 16 -  Creatinine 0.44 - 1.00 mg/dL 0.70 0.79 0.54  Sodium 135 - 145 mmol/L 137 134(L) -  Potassium 3.5 - 5.1 mmol/L 3.9 4.2 3.7  Chloride 101 - 111 mmol/L 104 99(L) -  CO2 22 - 32 mmol/L 26 26 -  Calcium 8.9 - 10.3 mg/dL 9.2 8.9 -   Ct Head Wo Contrast  Result Date: 06/03/2016 CLINICAL DATA:  83 year old female with dizziness slurred speech and unsteady gait this morning. Symptoms now resolved. Initial encounter. EXAM: CT HEAD WITHOUT CONTRAST TECHNIQUE: Contiguous axial images were obtained from the base of the skull through the vertex without intravenous contrast. COMPARISON:  None. FINDINGS: Brain: Densely calcified 15 mm right anterior frontal convexity round extra-axial mass is probably a meningioma (series 2 image 21 and coronal image 16). Minimal mass effect on the adjacent anterior right frontal lobe. No associated cerebral edema. Underlying cerebral volume is normal for age. No midline shift, ventriculomegaly, intracranial hemorrhage or evidence of cortically based acute infarction. Gray-white matter differentiation is within normal limits throughout the brain. No cortical encephalomalacia. Vascular: Extensive ICA siphon calcified atherosclerosis at the skullbase. No suspicious intracranial vascular hyperdensity. Skull: Mild hyperostosis of the calvarium. Mildly heterogeneous bone mineralization throughout the skull. No suspicious osseous lesion identified. Sinuses/Orbits: Mild maxillary sinus mucosal thickening, otherwise well pneumatized. Other: No acute orbit or scalp soft tissue findings. IMPRESSION: 1. Largely unremarkable for  age noncontrast CT appearance of the brain. 2. A 15 mm right anterior convexity partially calcified masses probably a meningioma. No associated cerebral edema and I favor this is clinically silent. 3. Mild maxillary  sinus inflammation. Electronically Signed   By: Genevie Ann M.D.   On: 06/03/2016 13:06   Assessment/Plan: Presumed TIA, transient episode of dysarthria and imbalance on presentation, full recollection of events. Stoke risk factors include HTN, age, and obesity. CT head without acute abnormality. Admitted for TIA workup. Lipids, TSH nml. - Brain MRI wo contrast pending today, prn ativan 1x available - Carotid doppler US - normal - TTE - LVEF 60-65%, no WMA, G1DD, mod aortic valve calcification - Telemetry - unrmkble - A1c pending - Optimize management of hypertension, likely HLD - Aspirin 81 mg daily - PT OT SLP eval and treat - no outpatient needs, supervision at home  Hypertension, hypertensive to 170s-180s today - Start Lisinopril 10mg  daily -continue home Atenolol 50mg , Doxazosin 2mg   Osteoarthritis pain, chronic - Tylenol PRN, continue home Gabapentin 800mg  TID, Tramdol 50mg  BID PRN  Chronic back pain, scoliosis - home Carisoprodol (Soma) 350mg  QHS PRN, continue home Raloxifene  GI upset - continue home Hyoscyamine TID PRN for cramping  Insomnia - continue home Xanax QHS PRN   Dispo: Anticipated discharge in approximately 0-1 day(s). Waiting on brain MRI.  LOS: 0 days   Asencion Partridge, MD 06/04/2016, 11:47 AM Pager: [phone]

## 2016-06-04 NOTE — Evaluation (Addendum)
Occupational Therapy Evaluation/Discharge Patient Details Name: Elizabeth Mullins MRN: [id number] DOB: 21-Jan-1932 Today's Date: 06/04/2016    History of Present Illness Pt is an 83 y.o. female who presented to the ED after her family as well as staff at primary care office noticed mumbling and slurring of words. She reports that her tongue "felt heavy." CT unremarkable. Pt has a PMH significant for arthritis, chronic back pain, diverticulitis, hypertension, impaired mobility, and scoliosis.   Clinical Impression   PTA, pt was independent with a cane for all ADL and functional mobility. She is active in the community and does drive but not often. Pt currently presents at her baseline functioning for ADL. Coordination, strength, and cognition in tact for tasks assessed on evaluation. She did demonstrate some impulsivity with activities with no family or caregiver present to report baseline status. However, pt reports that she jumps from activity to activity at home and feel this is likely her baseline. Pt's daughter and son-in-law live with her and she will have 24 hour supervision if needed post-acute D/C. Educated pt on safety and fall prevention post-acute D/C. No further acute OT needs identified. OT will sign off.    Follow Up Recommendations  No OT follow up;Supervision/Assistance - 24 hour    Equipment Recommendations  None recommended by OT    Recommendations for Other Services       Precautions / Restrictions Restrictions Weight Bearing Restrictions: No      Mobility Bed Mobility Overal bed mobility: Modified Independent             General bed mobility comments: HOB elevated   Transfers Overall transfer level: Needs assistance Equipment used: None Transfers: Sit to/from Stand Sit to Stand: Supervision         General transfer comment: Uses cane at home and cane unavailable. Pt requiring general supervision for safety.    Balance Overall balance assessment: No  apparent balance deficits (not formally assessed)                                          ADL Overall ADL's : At baseline;Modified independent                                             Vision Vision Assessment?: No apparent visual deficits;Yes Eye Alignment: Within Functional Limits Ocular Range of Motion: Within Functional Limits Alignment/Gaze Preference: Within Defined Limits Tracking/Visual Pursuits: Able to track stimulus in all quads without difficulty Saccades: Within functional limits Convergence: Within functional limits Visual Fields: No apparent deficits   Perception     Praxis      Pertinent Vitals/Pain Pain Assessment: No/denies pain     Hand Dominance Right   Extremity/Trunk Assessment Upper Extremity Assessment Upper Extremity Assessment: Overall WFL for tasks assessed   Lower Extremity Assessment Lower Extremity Assessment: Defer to PT evaluation       Communication Communication Communication: No difficulties   Cognition Arousal/Alertness: Awake/alert Behavior During Therapy: WFL for tasks assessed/performed Overall Cognitive Status: No family/caregiver present to determine baseline cognitive functioning                 General Comments: Pt impulsive and jumps from task to task. Feel this may be her baseline.   General Comments  Exercises       Shoulder Instructions      Home Living Family/patient expects to be discharged to:: Private residence Living Arrangements: Children Available Help at Discharge: Family;Available 24 hours/day Type of Home: House Home Access: Level entry     Home Layout: One level     Bathroom Shower/Tub: Occupational psychologist: Standard (in Restaurant manager, fast food; handicapped in other bathrooms) Bathroom Accessibility: Yes   Home Equipment: Hannah - single point;Bedside commode;Shower seat;Walker - 2 wheels;Grab bars - tub/shower;Hand held shower head           Prior Functioning/Environment Level of Independence: Independent        Comments: Reports slower due to arthritis; driving        OT Problem List: Decreased activity tolerance;Pain;Decreased safety awareness;Decreased knowledge of precautions   OT Treatment/Interventions:      OT Goals(Current goals can be found in the care plan section) Acute Rehab OT Goals Patient Stated Goal: to go home OT Goal Formulation: With patient Time For Goal Achievement: 06/11/16 Potential to Achieve Goals: Good  OT Frequency:     Barriers to D/C:            Co-evaluation              End of Session    Activity Tolerance: Patient tolerated treatment well Patient left: in bed;with call bell/phone within reach   Time: 1000-1025 OT Time Calculation (min): 25 min Charges:  OT General Charges $OT Visit: 1 Procedure OT Evaluation $OT Eval Moderate Complexity: 1 Procedure OT Treatments $Self Care/Home Management : 8-22 mins G-Codes: OT G-codes **NOT FOR INPATIENT CLASS** Functional Assessment Tool Used: clinical judgement Functional Limitation: Self care Self Care Current Status [id number]): At least 1 percent but less than 20 percent impaired, limited or restricted Self Care Goal Status [id number]): At least 1 percent but less than 20 percent impaired, limited or restricted Self Care Discharge Status [phone]): At least 1 percent but less than 20 percent impaired, limited or restricted  Norman Herrlich, OTR/L [phone] 06/04/2016, 11:20 AM

## 2016-06-04 NOTE — Progress Notes (Signed)
  Echocardiogram 2D Echocardiogram has been performed.  Elisabet Gutzmer L Androw 06/04/2016, 11:59 AM

## 2016-06-04 NOTE — Progress Notes (Signed)
PT Cancellation Note  Patient Details Name: Elizabeth Mullins MRN: [id number] DOB: Oct 16, 1931   Cancelled Treatment:    Reason Eval/Treat Not Completed: PT screened, no needs identified, will sign off; patient reports at her functional baseline.  Up in room going to bathroom unaided, though has a limp which pt reports is due to arthritis and used cane at home.  Noted OT notes.  Will defer PT evaluation as pt at baseline level for mobility.   Elizabeth Mullins 06/04/2016, 2:08 PM  Magda Kiel, [place] 06/04/2016

## 2016-06-04 NOTE — Progress Notes (Signed)
  Date: 06/04/2016  Patient name: Elizabeth Mullins  Medical record number: [id number]  Date of birth: 1931-06-08   I have seen and evaluated Elizabeth Mullins and discussed their care with the Residency Team. Briefly, this is an 83yo woman with PMH of HTN, obesity who presented with slurred speech and imbalance that had resolved prior to being seen by our team.  Presumed TIA.  Patient also notes that she has had higher blood pressures of late and has had a harder time controlling her BP.  She has been admitted for TIA work up.   Fam Hx, and/or Soc Hx : She reported no pertinent family history.  She is a non smoker  Vitals:   06/04/16 0759 06/04/16 1224  BP: (!) 173/89 (!) 175/82  Pulse:  88  Resp: 16   Temp: 98.1 F (36.7 C) 98 F (36.7 C)   Physical Exam:  Gen: Elderly woman, alert, conversive HENT: NCAT, neck supple Eyes: EOMI, PERRLA CV: RR, NR, no murmur Pulm: CTAB, no wheezing Abd: Soft, +BS Ext: Thin, minimal non pitting edema to ankles Neuro: Alert and oriented, no slurring of speech, naming was intact, CN grossly intact, Strength 5/5 throughout.  RAM normal. Heal to shin normal.    Pertinent data  CT head showed an unremarkable head CT.  A 15mm hemangioma which the patient was aware of.    TTE with grade 1 dCHF  MRI: no acute infarct.  Did note a probable second meningioma which did not have any edema or mass effect  Assessment and Plan: I have seen and evaluated the patient as outlined above. I agree with the formulated Assessment and Plan as detailed in the residents' note, with the following changes:   1. TIA - Symptoms most consistent with TIA, resolved upon admission.  Work up so far is non concerning - Monitor on telemetry - A1C pending - Lipid panel with normal LDL, TG - Neurology following - Optimize HTN management - without stroke likely do not have to allow permissive HTN beyond tomorrow.   2. HTN - Continue home atenolol, doxazosin - Consider adding another agent  if she has elevated BP again prior to discharge.  Currently BP is 152/78.  Could consider amlodipine.   Other issues per resident note.   Sid Falcon, MD 1/16/20182:36 PM

## 2016-06-05 DIAGNOSIS — I10 Essential (primary) hypertension: Secondary | ICD-10-CM | POA: Diagnosis not present

## 2016-06-05 DIAGNOSIS — G459 Transient cerebral ischemic attack, unspecified: Principal | ICD-10-CM

## 2016-06-05 LAB — HEMOGLOBIN A1C
Hgb A1c MFr Bld: 5.5 % (ref 4.8–5.6)
MEAN PLASMA GLUCOSE: 111 mg/dL

## 2016-06-05 MED ORDER — ASPIRIN 81 MG PO TBEC: 81.0000 mg | DELAYED_RELEASE_TABLET | Freq: Every day | ORAL | 2 refills | Status: DC

## 2016-06-05 MED ORDER — LISINOPRIL 10 MG PO TABS: 10.0000 mg | ORAL_TABLET | Freq: Every day | ORAL | 3 refills | Status: DC

## 2016-06-05 MED ORDER — MENTHOL 3 MG MT LOZG: 1.0000 | LOZENGE | OROMUCOSAL | 12 refills | Status: DC | PRN
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
## 2016-06-05 NOTE — Care Management Obs Status (Signed)
[facility] NOTIFICATION   Patient Details  Name: Emojean Wineberg MRN: [id number] Date of Birth: January 13, 1932   Medicare Observation Status Notification Given:  Yes (MRI negative)    Pollie Friar, RN 06/05/2016, 11:52 AM

## 2016-06-05 NOTE — Progress Notes (Signed)
  Date: 06/05/2016  Patient name: Elizabeth Mullins  Medical record number: [id number]  Date of birth: [birth year]   I have seen and evaluated this patient and I have discussed the plan of care with the house staff. Please see Dr. Durenda Age note for complete details. I concur with his findings.   Patient will be discharged today.   Sid Falcon, MD 06/05/2016, 1:00 PM

## 2016-06-05 NOTE — Progress Notes (Signed)
Subjective: Elizabeth Mullins is again pleasant with no complaints this morning. She has developed a raspy voice and feels like she has come down with laryngitis, endorses several days of "raspy throat." Improved BP control compared to yesterday and unremarkable brain MRI last night, stable for DC today when ride available.  Review of telemetry reveals no documented arrhythmias overnight.   Objective: Vital signs in last 24 hours: Vitals:   06/04/16 1600 06/04/16 2116 06/05/16 0059 06/05/16 0500  BP: (!) 152/78 137/81 127/66 120/78  Pulse: 88 79 82 77  Resp:   17 18  Temp:  97.7 F (36.5 C) 97.8 F (36.6 C) 98 F (36.7 C)  TempSrc:  Oral Oral Oral  SpO2: 99% 97% 98% 99%  Weight:    139 lb 12.4 oz (63.4 kg)  Height:    4\' 11"  (1.499 m)   No intake or output data in the 24 hours ending 06/05/16 0726  Physical Exam General appearance: Elderly woman resting comfortably in bed, in no distress, pleasant and conversational,  HENT: Normocephalic, moist mucous membranes Eyes: PERRL, EOM inact, non-icteric Cardiovascular: Regular rate and rhythm, no murmurs, rubs, gallops Respiratory: Clear to auscultation bilaterally, normal work of breathing Abdomen: Obese, BS+, soft, non-tender, non-distended, right sided reducible incisional hernia Extremities: Normal bulk and range of motion, trace edema of BLEs, 2+ peripheral pulses Skin: Warm, dry, intact Neuro: Alert and oriented, cranial nerves grossly intact Psych: Appropriate affect, clear speech, thoughts linear and goal-directed  Labs / Imaging / Procedures: CBC Latest Ref Rng & Units 06/04/2016 06/03/2016 10/22/2015  WBC 4.0 - 10.5 K/uL 8.3 9.1 10.3  Hemoglobin 12.0 - 15.0 g/dL 12.5 11.8(L) 8.3(L)  Hematocrit 36.0 - 46.0 % 37.7 35.2(L) 24.8(L)  Platelets 150 - 400 K/uL 179 162 287   BMP Latest Ref Rng & Units 06/04/2016 06/03/2016 10/21/2015  Glucose 65 - 99 mg/dL 103(H) 94 -  BUN 6 - 20 mg/dL 15 16 -  Creatinine 0.44 - 1.00 mg/dL 0.70 0.79 0.54    Sodium 135 - 145 mmol/L 137 134(L) -  Potassium 3.5 - 5.1 mmol/L 3.9 4.2 3.7  Chloride 101 - 111 mmol/L 104 99(L) -  CO2 22 - 32 mmol/L 26 26 -  Calcium 8.9 - 10.3 mg/dL 9.2 8.9 -   Mr Brain Wo Contrast  Result Date: 06/04/2016 CLINICAL DATA:  83 year old hyperdense female with dizziness and slurred speech yesterday which has resolved. Subsequent encounter. EXAM: MRI HEAD WITHOUT CONTRAST TECHNIQUE: Multiplanar, multiecho pulse sequences of the brain and surrounding structures were obtained without intravenous contrast. COMPARISON:  06/03/2016 head CT.  No comparison MR. FINDINGS: Brain: No acute infarct. Probable T2 shine through/artifact posterior right corona radiata on diffusion sequence. Right frontal lobe 1.4 cm calcified meningioma without surrounding vasogenic edema. Left frontal region 8 mm extra-axial structure probably a second meningioma without associated mass effect or vasogenic edema. Mild global atrophy without hydrocephalus. No intracranial hemorrhage. Vascular: Major intracranial arterial structures are patent. Flow artifact versus thrombosis left sigmoid sinus/ jugular fossa. If further delineation were clinically desired, MR venography can be performed. Skull and upper cervical spine: Negative. Sinuses/Orbits: No acute orbital abnormality. Mild to moderate mucosal thickening right maxillary sinus and mild mucosal thickening left maxillary sinus. Minimal mucosal thickening ethmoid sinus air cells. Other: Negative. IMPRESSION: No acute infarct. Right frontal lobe 1.4 cm calcified meningioma without surrounding vasogenic edema. Left frontal region 8 mm extra-axial structure probably a second meningioma without associated mass effect or vasogenic edema. Mild global atrophy without hydrocephalus. Flow  artifact versus thrombosis left sigmoid sinus/ jugular fossa. If further delineation were clinically desired, MR venography can be performed. Mild to moderate mucosal thickening right maxillary  sinus and mild mucosal thickening left maxillary sinus. Minimal mucosal thickening ethmoid sinus air cells. Electronically Signed   By: Genia Del M.D.   On: 06/04/2016 18:39   Assessment/Plan: Presumed TIA, transient episode of dysarthria and imbalance on presentation, full recollection of events. Stoke risk factors include HTN, age, and obesity. CT head without acute abnormality. Admitted for TIA workup. Lipids, TSH nml. - Brain MRI with no evidence of acute or remote infarct - Carotid doppler US - normal - TTE - LVEF 60-65%, no WMA, G1DD, mod aortic valve calcification - Telemetry - unrmkble - A1c 5.5 - Optimize management of hypertension - Aspirin 81 mg daily - PT OT SLP eval and treat - no outpatient needs, supervision at home  Hypertension, improved - Lisinopril 10mg  daily added to her regimen -continue home Atenolol 50mg , Doxazosin 2mg   Osteoarthritis pain, chronic - Tylenol PRN, continue home Gabapentin 800mg  TID, Tramdol 50mg  BID PRN  Chronic back pain, scoliosis - home Carisoprodol (Soma) 350mg  QHS PRN, continue home Raloxifene  GI upset - continue home Hyoscyamine TID PRN for cramping  Insomnia - continue home Xanax QHS PRN   Dispo: Anticipated discharge today   LOS: 0 days   Asencion Partridge, MD 06/05/2016, 7:26 AM Pager: [phone]

## 2016-06-06 DIAGNOSIS — I10 Essential (primary) hypertension: Secondary | ICD-10-CM | POA: Diagnosis not present

## 2016-06-06 DIAGNOSIS — G459 Transient cerebral ischemic attack, unspecified: Secondary | ICD-10-CM | POA: Diagnosis not present

## 2016-06-06 LAB — GLUCOSE, CAPILLARY: GLUCOSE-CAPILLARY: 118 mg/dL — AB (ref 65–99)
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
## 2016-06-06 NOTE — Progress Notes (Signed)
Pt being discharged from hospital per orders from MD. Pt educated on discharge instructions. Pt verbalized understanding of instructions. All questions and concerns were addressed. Pt's IV was removed prior to discharge. Pt exited hospital via wheelchair. 

## 2016-06-06 NOTE — Progress Notes (Signed)
  Date: 06/06/2016  Patient name: Elizabeth Mullins  Medical record number: [id number]  Date of birth: 1931/10/26   I have seen and evaluated this patient and I have discussed the plan of care with the house staff. Please see Dr. Durenda Age note for complete details. I concur with his findings.  Sid Falcon, MD 06/06/2016, 11:04 AM

## 2016-06-06 NOTE — Care Management Note (Signed)
Case Management Note  Patient Details  Name: Elizabeth Mullins MRN: [id number] Date of Birth: [birth year]  Subjective/Objective:                    Action/Plan: Pt discharging home with self care. Pt has transportation home. No further needs per CM.   Expected Discharge Date:  06/06/16               Expected Discharge Plan:  Home/Self Care  In-House Referral:     Discharge planning Services     Post Acute Care Choice:    Choice offered to:     DME Arranged:    DME Agency:     HH Arranged:    HH Agency:     Status of Service:  Completed, signed off  If discussed at [employer] of Stay Meetings, dates discussed:    Additional Comments:  Pollie Friar, RN 06/06/2016, 11:12 AM

## 2016-06-14 NOTE — Progress Notes (Signed)
ECHO 1-16-18epic EKG 06-03-16 epic CBC, A1C, CMET 06-04-16 epic

## 2016-06-14 NOTE — Patient Instructions (Addendum)
Elizabeth Mullins  06/14/2016   Your procedure is scheduled on: 06-21-16  Report to [facility]  Entrance take [facility]  elevators to 3rd floor to  [place] at 0530AM.  Call this number if you have problems the morning of surgery [phone]   Remember: ONLY 1 PERSON MAY GO WITH YOU TO SHORT STAY TO GET  READY MORNING OF [place].  Do not eat food or drink liquids :After Midnight.     Take these medicines the morning of surgery with A SIP OF WATER: atenolol (tenormin), doxasosin ( Cardura), gabapentin (neurontin)                               You may not have any metal on your body including hair pins and              piercings  Do not wear jewelry, make-up, lotions, powders or perfumes, deodorant             Do not wear nail polish.  Do not shave  48 hours prior to surgery.              Men may shave face and neck.   Do not bring valuables to the hospital. [place].  Contacts, dentures or bridgework may not be worn into surgery.  Leave suitcase in the car. After surgery it may be brought to your room.     Patients discharged the day of surgery will not be allowed to drive home.  Name and phone number of your driver: daughter britainy nemeroff cell [phone]  Special Instructions: N/A              Please read over the following fact sheets you were given: _____________________________________________________________________             [facility] - Preparing for Surgery Before surgery, you can play an important role.  Because skin is not sterile, your skin needs to be as free of germs as possible.  You can reduce the number of germs on your skin by washing with CHG (chlorahexidine gluconate) soap before surgery.  CHG is an antiseptic cleaner which kills germs and bonds with the skin to continue killing germs even after washing. Please DO NOT use if you have an allergy to CHG or antibacterial soaps.  If  your skin becomes reddened/irritated stop using the CHG and inform your nurse when you arrive at Short Stay. Do not shave (including legs and underarms) for at least 48 hours prior to the first CHG shower.  You may shave your face/neck. Please follow these instructions carefully:  1.  Shower with CHG Soap the night before surgery and the  morning of Surgery.  2.  If you choose to wash your hair, wash your hair first as usual with your  normal  shampoo.  3.  After you shampoo, rinse your hair and body thoroughly to remove the  shampoo.                           4.  Use CHG as you would any other liquid soap.  You can apply chg directly  to the skin and wash  Gently with a scrungie or clean washcloth.  5.  Apply the CHG Soap to your body ONLY FROM THE NECK DOWN.   Do not use on face/ open                           Wound or open sores. Avoid contact with eyes, ears mouth and genitals (private parts).                       Wash face,  Genitals (private parts) with your normal soap.             6.  Wash thoroughly, paying special attention to the area where your surgery  will be performed.  7.  Thoroughly rinse your body with warm water from the neck down.  8.  DO NOT shower/wash with your normal soap after using and rinsing off  the CHG Soap.                9.  Pat yourself dry with a clean towel.            10.  Wear clean pajamas.            11.  Place clean sheets on your bed the night of your first shower and do not  sleep with pets. Day of Surgery : Do not apply any lotions/deodorants the morning of surgery.  Please wear clean clothes to the hospital/surgery center.  FAILURE TO FOLLOW THESE INSTRUCTIONS MAY RESULT IN THE CANCELLATION OF YOUR SURGERY PATIENT SIGNATURE_________________________________  NURSE SIGNATURE__________________________________  ________________________________________________________________________

## 2016-06-18 ENCOUNTER — Encounter (HOSPITAL_COMMUNITY)
Admission: RE | Admit: 2016-06-18 | Discharge: 2016-06-18 | Disposition: A | Discharge status: Home / Self Care | Payer: Medicare Other | Source: Ambulatory Visit | Attending: Surgery | Admitting: Surgery

## 2016-06-18 ENCOUNTER — Encounter (HOSPITAL_COMMUNITY): Payer: Self-pay | Admitting: *Deleted

## 2016-06-18 DIAGNOSIS — Z01812 Encounter for preprocedural laboratory examination: Secondary | ICD-10-CM | POA: Insufficient documentation

## 2016-06-18 DIAGNOSIS — K432 Incisional hernia without obstruction or gangrene: Secondary | ICD-10-CM | POA: Insufficient documentation

## 2016-06-18 HISTORY — DX: Transient cerebral ischemic attack, unspecified: G45.9

## 2016-06-18 NOTE — Progress Notes (Signed)
Spoke with dr hatchett anesthesia and made aware of tia ruled out on hospital admission 06-03-16 and all tests normal. Patient ok for surgery per dr Jillyn Hidden.  Echo 06-04-16 ekg 06-03-16 epic Cbc, Hemaglobin A1C , cmet 06-04-16

## 2016-06-19 ENCOUNTER — Ambulatory Visit: Payer: Self-pay | Admitting: Surgery

## 2016-06-19 NOTE — H&P (Addendum)
Elizabeth Mullins 05/06/2016 5:03 PM Location: [facility] Patient #: [id number] DOB: Sep 25, 1931 Widowed / Language: [name] / Race: White Female  Patient Care Team: Loraine Leriche, MD as PCP - General (Internal Medicine) Michael Boston, MD as Consulting Physician (General Surgery)    History of Present Illness  The patient is a 83 year old female who presents with an abdominal mass. Note for "Abdominal mass": Patient comes in today for concern of new abdominal mass.  Pleasant 83 year old female. Head diverticulitis with an abscess. Underwent robotically assisted sigmoid colectomy earlier this year. Recovered rather well. Patient's been more active and helping with moving. She is no some lumps on her right side. It does not hurt. She has noted some pain on her left lower abdomen as well. Usually more with activity level. Usually goes away. She has not been to compliant on her MiraLAX. Sometimes she feels more discomfort when she is constipated. Then a gets better. No nausea or vomiting. No fevers or chills. Energy level good. She does not smoke. Remaining active.    10/18/2015  PATIENT: Elizabeth Mullins 83 y.o. female  Patient Care Team: Loraine Leriche, MD as PCP - General (Internal Medicine) Michael Boston, MD as Consulting Physician (General Surgery)  PRE-OPERATIVE DIAGNOSIS: Persistent sigmoid diverticulitis with recurrent abscesses  POST-OPERATIVE DIAGNOSIS: Persistent sigmoid diverticulitis with recurrent abscesses   PROCEDURE:   XI ROBOT ASSISTED LOW ANTERIOR RECTOSIGMOID RESECTION Robotic lysis of adhesions x 83min Robotic mobilization of the splenic flexure of the colon RIGID PROCTOSCOPY  SURGEON: Michael Boston, MD  ASSISTANT: Leighton Ruff, MD  ANESTHESIA: local and general  EBL: Total I/O In: 3000 [I.V.:3000] Out: 600 [Urine:300; Blood:300]  Delay start of Pharmacological VTE agent (>24hrs) due to surgical blood loss or  risk of bleeding: no  DRAINS: (19Fr) Blake drain(s) in the PELVIS  SPECIMEN: Source of Specimen:   1. Rectosigmoid colon with severe diverticulitis. Open end is proximal.  2. Anastomotic rings. Blue stitch is proximal anastomotic ring.  DISPOSITION OF SPECIMEN: PATHOLOGY  COUNTS: YES  PLAN OF CARE: Admit to inpatient  PATIENT DISPOSITION: PACU - hemodynamically stable.  INDICATION:   Pleasant elderly woman struggling with diverticulitis with recurrent episodes of pain. Developed abscess formation. Percutaneously drained. Recommendation made for surgery. Drain partially dislodged. New abscess. A drain. Better. I recommended surgical resection. Noted possibility need of ostomy. In discussion with the patient and daughter. Patient anxious but consolable.  The anatomy & physiology of the digestive tract was discussed. The pathophysiology was discussed. Natural history risks without surgery was discussed. I worked to give an overview of the disease and the frequent need to have multispecialty involvement. I feel the risks of no intervention will lead to serious problems that outweigh the operative risks; therefore, I recommended a partial colectomy to remove the pathology. Laparoscopic & open techniques were discussed.  Risks such as bleeding, infection, abscess, leak, reoperation, possible ostomy, hernia, heart attack, death, and other risks were discussed. I noted a good likelihood this will help address the problem. Goals of post-operative recovery were discussed as well. We will work to minimize complications. Educational materials on the pathology had been given in the office. Questions were answered.   The patient & daughter expressed understanding & wished to proceed with surgery.  OR FINDINGS:  Patient had very torturous inflamed corkscrew sigmoid colon. Very dense adhesion to left anterior pelvic rim. Fallopian tubes on both sides as well as  uterus and ovaries and densely adherent to the liver. Most likely  location of walled off abscess. Very foreshortened left colon mesentery requiring splenic flexure mobilization.  No obvious metastatic disease on visceral parietal peritoneum or liver.  The anastomosis rests 12 cm from the anal verge by rigid proctoscopy.  Diagnosis 1. Colon, segmental resection - DIVERTICULITIS WITH PERFORATION AND PERICOLONIC ABSCESS. - NO EVIDENCE OF MALIGNANCY. 2. Colon, resection margin (donut), anastomosis proxmial - UNREMARKABLE COLON. - NO INFLAMMATION OR MALIGNANCY. 3. Colon, resection margin (donut), anastomosis distal end - UNREMARKABLE COLON. - NO INFLAMMATION OR MALIGNANCY. Claudette Laws MD Pathologist, Electronic Signature (Case signed 10/20/2015) Specimen Tracie Lindbloom and Clinical Information Specimen(s) Obtained: 1. Colon, segmental resection 2. Colon, resection margin (donut), anastomosis proxmial 3. Colon, resection margin (donut), anastomosis distal end Specimen Clinical Information 1. persistent sigmoid colectomy rigid proctoscopy (kp) Siaosi Alter 1. Specimen: Intact portion of sigmoid colon with an open proximal margin and stapled distal margin. There is also a focally disrupted, 2.5 x 1.5 cm area located 8 cm from the proximal margin. Length: 27 cm in length and averaging 3.5 cm in diameter. Serosa: ragged dusky red-brown and disrupted. Contents: Opening reveals minimal fecal material. Mucosa/Wall: Glistening, tan-pink mucosal folds with a stenotic lumen and wall ranging from 0.5 to 1.5 cm in thickness. Sectioning reveals moderate to severe diverticular disease with a possible perforated diverticula in the disrupted area of the serosa. Also identified in this area is a irregularly shaped, 4.0 x 1.5 x 1.5 cm area of ragged red 1 of 2 FINAL for Acevedo, Alin [id number]) Jhamari Markowicz(continued) brown nodularity which contains an ovoid tan-white cut surface. Lymph nodes: Three lymph nodes are  grossly palpable in the attached fat ranging from 0.3 to 0.7 cm. Block Summary: A= proximal margin. B= distal margin. C= area of disrupted serosa. D-E= representative diverticula F-H= area of nodular ovoid tan white tissue attached to a disrupted area. I= two lymph nodes. J= larger lymph node bisected. 10 blocks total. 2. Received in formalin is a 2.5 x 2.0 x 1.0 cm ovoid, annular tan-pink tissue with blue suture material designating the proximal end. The specimen has a granular, tan-pink mucosa and a 0.4 cm lumen. Margin is taken en face and submitted in block 2A. 3. Received in formalin is a 2.5 x 1.5 x 1.2 cm ovoid portion of glistening, tan-pink mucosa with an open end and a stapled end. Representative sections of the margin are submitted in block 3A. (KF:gt, 10/20/15) Report signed out from the following location(s) Technical component and interpretation was performed at [facility] [place], [place], [place] [address]. CLIA #: [id number],   Problem List/Past Medical [name], MD; 05/06/2016 5:15 PM) ABSCESS OF SIGMOID COLON DUE TO DIVERTICULITIS (K57.20)  ENCOUNTER FOR PREOPERATIVE EXAMINATION FOR GENERAL SURGICAL PROCEDURE (Z01.818)  COLONIC FISTULA (K63.2)  S/P COLECTOMY (Z90.49)  VISIT FOR WOUND CHECK (Z51.89)  HX DIVERTICULITIS OF COLON - F/U PRN (Z87.19)   Past Surgical History [name], MD; 05/06/2016 5:15 PM) Appendectomy  Breast Biopsy  Right. Hysterectomy (not due to cancer) - Partial  Knee Surgery  Right. Tonsillectomy   Diagnostic Studies History [name], MD; 05/06/2016 5:15 PM) Colonoscopy  5-10 years ago  Allergies Benjiman Core, CMA; 05/06/2016 5:09 PM) OXYCODONE  Codeine Phosphate *ANALGESICS - OPIOID*  Demerol *ANALGESICS - OPIOID*  Dilaudid *ANALGESICS - OPIOID*  Morphine Sulfate (Concentrate) *ANALGESICS - OPIOID*  FentaNYL *ANALGESICS - OPIOID*  NSAIDs  Allergies Reconciled    Medication History (Armen Glenn, CMA; 05/06/2016 5:13 PM) ALPRAZolam (0.5MG  Tablet, Oral) Active. Carisoprodol (350MG  Tablet, Oral)  Active. Doxazosin Mesylate (2MG  Tablet, Oral) Active. Atenolol (50MG  Tablet, Oral) Active. Vigamox (0.5% Solution, Ophthalmic) Active. Raloxifene HCl (60MG  Tablet, Oral) Active. Gabapentin (800MG  Tablet, Oral) Active. PrednisoLONE Acetate (1% Suspension, Ophthalmic) Active. Medications Reconciled  Social History [name], MD; 05/06/2016 5:15 PM) Caffeine use  Coffee. No alcohol use  No drug use  Tobacco use  Never smoker.  Family History [name], MD; 05/06/2016 5:15 PM) Arthritis  Daughter, Mother, Sister. Hypertension  Mother. Prostate Cancer  Father.  Pregnancy / Birth History [name], MD; 05/06/2016 5:15 PM) Age at menarche  13 years.  Other Problems [name], MD; 05/06/2016 5:15 PM) Back Pain  Diverticulosis  High blood pressure   Vitals (Armen Glenn CMA; 05/06/2016 5:06 PM) 05/06/2016 5:05 PM Weight: 140.38 lb Height: 59in Body Surface Area: 1.59 m Body Mass Index: 28.35 kg/m  Temp.: 98.96F  Pulse: 83 (Regular)  P.OX: 95% (Room air) BP: 126/72 (Sitting, Left Arm, Standard)       Physical Exam [name], MD; 05/06/2016 5:15 PM) General Mental Status-Alert. General Appearance-Not in acute distress. Voice-Normal. Note: Relaxed. Nontoxic.   Integumentary Global Assessment Normal Exam - Distribution of scalp and body hair is normal. General Characteristics Overall Skin Surface - no rashes and no suspicious lesions.  Head and Neck Head-normocephalic, atraumatic with no lesions or palpable masses. Face Global Assessment - atraumatic, no absence of expression. Neck Global Assessment - no abnormal movements, no decreased range of motion. Trachea-midline. Thyroid Gland Characteristics - non-tender.  Eye Eyeball - Left-Extraocular  movements intact, No Nystagmus. Eyeball - Right-Extraocular movements intact, No Nystagmus. Upper Eyelid - Left-No Cyanotic. Upper Eyelid - Right-No Cyanotic.  Chest and Lung Exam Inspection Accessory muscles - No use of accessory muscles in breathing.  Abdomen Note: Incisions with normal healing ridges. No hernia. No cellulitis. No guarding/rebound tenderness   Peripheral Vascular Upper Extremity Inspection - Left - Not Gangrenous, No Petechiae. Right - Not Gangrenous, No Petechiae.  Neurologic Neurologic evaluation reveals -normal attention span and ability to concentrate, able to name objects and repeat phrases. Appropriate fund of knowledge and normal coordination.  Neuropsychiatric Mental status exam performed with findings of-able to articulate well with normal speech/language, rate, volume and coherence and no evidence of hallucinations, delusions, obsessions or homicidal/suicidal ideation. Orientation-oriented X3.  Musculoskeletal Global Assessment Gait and Station - normal gait and station.  Lymphatic General Lymphatics Description - No Generalized lymphadenopathy.    Assessment & PlanINCISIONAL HERNIA, WITHOUT OBSTRUCTION OR GANGRENE (K43.2) Impression: Small but definite incisional hernia at the right sided extraction point. Standard of care would be repair of hernia. Stitches failed, so therefore do mesh. Underlay repair.  Because it is small and not bothersome, she wishes to hold off on any surgery. Should it become larger or bother her, she will reconsider and let us know. Current Plans Pt Education - [facility] Free Text Education/Instructions: discussed with patient and provided information. LLQ PAIN (R10.32) Impression: Mild but definite left lower quadrant pain. Perhaps related to constipation. I stressed that she needs to be more regular on her MiraLAX to avoid constipation and discomfort.  Another possibility is muscle wall strain since it seems  to be activity related. Recommended heat and anti-inflammatories. It her kidneys, would do Tylenol. That should go away by 3 months. If it is worsening, more suspicious for hernia.  Another possibility is a left inguinal hernia. Nothing definite on exam. Should she have a hernia repair of her incisional hernia, would do diagnostic laparoscopy to  look on that side and fix it if I find it. If that is negative, that is reassuring. Current Plans Pt Education - CCS Pain control - tylenol only: discussed with patient and provided information. ENCOUNTER FOR PREOPERATIVE EXAMINATION FOR GENERAL SURGICAL PROCEDURE (Z01.818) Current Plans Written instructions provided The anatomy & physiology of the abdominal wall was discussed. The pathophysiology of hernias was discussed. Natural history risks without surgery including progeressive enlargement, pain, incarceration, & strangulation was discussed. Contributors to complications such as smoking, obesity, diabetes, prior surgery, etc were discussed.  I feel the risks of no intervention will lead to serious problems that outweigh the operative risks; therefore, I recommended surgery to reduce and repair the hernia. I explained laparoscopic techniques with possible need for an open approach. I noted the probable use of mesh to patch and/or buttress the hernia repair  Risks such as bleeding, infection, abscess, need for further treatment, heart attack, death, and other risks were discussed. I noted a good likelihood this will help address the problem. Goals of post-operative recovery were discussed as well. Possibility that this will not correct all symptoms was explained. I stressed the importance of low-impact activity, aggressive pain control, avoiding constipation, & not pushing through pain to minimize risk of post-operative chronic pain or injury. Possibility of reherniation especially with smoking, obesity, diabetes, immunosuppression, and other health  conditions was discussed. We will work to minimize complications.  An educational handout further explaining the pathology & treatment options was given as well. Questions were answered. The patient expresses understanding & wishes to proceed with surgery.  Pt Education - [facility] Hernia Post-Op HCI (Bernell Haynie): discussed with patient and provided information. Pt Education - [facility] Pain Control (Bryar Dahms) Pt Education - Pamphlet Given - Laparoscopic Hernia Repair: discussed with patient and provided information. HX DIVERTICULITIS OF COLON - F/U PRN (Z87.19) Current Plans Return to clinic as needed.  Soreness, decreased appetite, and poor energy level are common problems after surgery. While many people can struggle with a bad day, these concerns should gradually fade away or at least improve. Much of your recovery depends on your health & the severity of your operation. Please call if you have any further questions / concerns related to surgery.  Increase activity as tolerated to regular everyday activity. Consider daily low impact exercise every day such as walking an hour a day.  Do not push through pain. If it hurts to do it, then don't do it.  Diet as tolerated. Low fat high fiber diet ideal. 30 g fiber a day ideal. Consider taking a daily fiber supplement to keep your bowels regular.  Followup with your primary care physician for other health issues as would normally be done.  Consider screening for malignancies (breast, prostate, colon, melanoma, etc) as appropriate. Discuss with you primary care physician.  Pt Education - CCS Diverticular Disease (AT) Pt Education - [facility] Good Bowel Health (Kiril Hippe)  Addendum: Patient wishes to reconsider surgery.  Plan laparoscopic hernia repair with underlay mesh.  Palpation versus overnight stenting depending how she does  [name], M.D., F.A.C.S. Gastrointestinal and Minimally Invasive Surgery [facility] [address] [place], [place] [address] [phone] Main / Paging

## 2016-06-21 ENCOUNTER — Inpatient Hospital Stay (HOSPITAL_COMMUNITY)
Admission: RE | Admit: 2016-06-21 | Discharge: 2016-06-24 | DRG: 352 | Disposition: A | Discharge status: Home / Self Care | Payer: Medicare Other | Source: Ambulatory Visit | Attending: Surgery | Admitting: Surgery

## 2016-06-21 ENCOUNTER — Encounter (HOSPITAL_COMMUNITY): Payer: Self-pay | Admitting: *Deleted

## 2016-06-21 ENCOUNTER — Ambulatory Visit (HOSPITAL_COMMUNITY): Payer: Medicare Other | Admitting: Registered Nurse

## 2016-06-21 ENCOUNTER — Encounter (HOSPITAL_COMMUNITY)
Admission: RE | Disposition: A | Discharge status: Home / Self Care | Payer: Self-pay | Source: Ambulatory Visit | Attending: Surgery

## 2016-06-21 DIAGNOSIS — Z9049 Acquired absence of other specified parts of digestive tract: Secondary | ICD-10-CM

## 2016-06-21 DIAGNOSIS — Z792 Long term (current) use of antibiotics: Secondary | ICD-10-CM

## 2016-06-21 DIAGNOSIS — G8929 Other chronic pain: Secondary | ICD-10-CM | POA: Diagnosis present

## 2016-06-21 DIAGNOSIS — M5412 Radiculopathy, cervical region: Secondary | ICD-10-CM | POA: Diagnosis present

## 2016-06-21 DIAGNOSIS — K869 Disease of pancreas, unspecified: Secondary | ICD-10-CM | POA: Diagnosis present

## 2016-06-21 DIAGNOSIS — G4700 Insomnia, unspecified: Secondary | ICD-10-CM

## 2016-06-21 DIAGNOSIS — M549 Dorsalgia, unspecified: Secondary | ICD-10-CM

## 2016-06-21 DIAGNOSIS — K432 Incisional hernia without obstruction or gangrene: Principal | ICD-10-CM | POA: Insufficient documentation

## 2016-06-21 DIAGNOSIS — Z886 Allergy status to analgesic agent status: Secondary | ICD-10-CM

## 2016-06-21 DIAGNOSIS — M159 Polyosteoarthritis, unspecified: Secondary | ICD-10-CM | POA: Diagnosis present

## 2016-06-21 DIAGNOSIS — Z7952 Long term (current) use of systemic steroids: Secondary | ICD-10-CM

## 2016-06-21 DIAGNOSIS — Z79899 Other long term (current) drug therapy: Secondary | ICD-10-CM

## 2016-06-21 DIAGNOSIS — Z8673 Personal history of transient ischemic attack (TIA), and cerebral infarction without residual deficits: Secondary | ICD-10-CM

## 2016-06-21 DIAGNOSIS — M419 Scoliosis, unspecified: Secondary | ICD-10-CM | POA: Diagnosis present

## 2016-06-21 DIAGNOSIS — F419 Anxiety disorder, unspecified: Secondary | ICD-10-CM | POA: Diagnosis present

## 2016-06-21 DIAGNOSIS — D329 Benign neoplasm of meninges, unspecified: Secondary | ICD-10-CM | POA: Diagnosis present

## 2016-06-21 DIAGNOSIS — H918X3 Other specified hearing loss, bilateral: Secondary | ICD-10-CM | POA: Diagnosis present

## 2016-06-21 DIAGNOSIS — K589 Irritable bowel syndrome without diarrhea: Secondary | ICD-10-CM | POA: Diagnosis present

## 2016-06-21 DIAGNOSIS — K5900 Constipation, unspecified: Secondary | ICD-10-CM | POA: Diagnosis present

## 2016-06-21 DIAGNOSIS — Z79810 Long term (current) use of selective estrogen receptor modulators (SERMs): Secondary | ICD-10-CM

## 2016-06-21 DIAGNOSIS — K4090 Unilateral inguinal hernia, without obstruction or gangrene, not specified as recurrent: Secondary | ICD-10-CM

## 2016-06-21 DIAGNOSIS — I10 Essential (primary) hypertension: Secondary | ICD-10-CM | POA: Diagnosis present

## 2016-06-21 DIAGNOSIS — Z885 Allergy status to narcotic agent status: Secondary | ICD-10-CM

## 2016-06-21 DIAGNOSIS — Z888 Allergy status to other drugs, medicaments and biological substances status: Secondary | ICD-10-CM

## 2016-06-21 DIAGNOSIS — Z96651 Presence of right artificial knee joint: Secondary | ICD-10-CM | POA: Diagnosis present

## 2016-06-21 DIAGNOSIS — Z8719 Personal history of other diseases of the digestive system: Secondary | ICD-10-CM

## 2016-06-21 HISTORY — DX: Personal history of other medical treatment: Z92.89

## 2016-06-21 HISTORY — PX: VENTRAL HERNIA REPAIR: SHX424

## 2016-06-21 HISTORY — PX: INSERTION OF MESH: SHX5868

## 2016-06-21 SURGERY — REPAIR, HERNIA, VENTRAL, LAPAROSCOPIC
Anesthesia: General | Site: Abdomen

## 2016-06-21 MED ORDER — ACETAMINOPHEN 650 MG RE SUPP: 650.0000 mg | RECTAL | Status: DC | PRN

## 2016-06-21 MED ORDER — LACTATED RINGERS IV BOLUS (SEPSIS)
1000.0000 mL | Freq: Three times a day (TID) | INTRAVENOUS | Status: AC | PRN
  Administered 2016-06-21: 1000 mL via INTRAVENOUS
  Filled 2016-06-21: qty 1000

## 2016-06-21 MED ORDER — SODIUM CHLORIDE 0.9 % IV SOLN: 250.0000 mL | INTRAVENOUS | Status: DC | PRN

## 2016-06-21 MED ORDER — MENTHOL 3 MG MT LOZG: 1.0000 | LOZENGE | OROMUCOSAL | Status: DC | PRN

## 2016-06-21 MED ORDER — BISACODYL 10 MG RE SUPP: 10.0000 mg | Freq: Two times a day (BID) | RECTAL | Status: DC | PRN

## 2016-06-21 MED ORDER — SODIUM CHLORIDE 0.9% FLUSH: 3.0000 mL | Freq: Two times a day (BID) | INTRAVENOUS | Status: DC

## 2016-06-21 MED ORDER — ALBUTEROL SULFATE (2.5 MG/3ML) 0.083% IN NEBU: 2.5000 mg | INHALATION_SOLUTION | Freq: Four times a day (QID) | RESPIRATORY_TRACT | Status: DC | PRN

## 2016-06-21 MED ORDER — BUPIVACAINE HCL (PF) 0.25 % IJ SOLN
INTRAMUSCULAR | Status: DC | PRN
  Administered 2016-06-21: 30 mL

## 2016-06-21 MED ORDER — PROPOFOL 10 MG/ML IV BOLUS
INTRAVENOUS | Status: DC | PRN
  Administered 2016-06-21: 160 mg via INTRAVENOUS

## 2016-06-21 MED ORDER — BUPIVACAINE LIPOSOME 1.3 % IJ SUSP
20.0000 mL | INTRAMUSCULAR | Status: DC
  Filled 2016-06-21: qty 20

## 2016-06-21 MED ORDER — SODIUM CHLORIDE 0.9 % IJ SOLN
INTRAMUSCULAR | Status: AC
  Filled 2016-06-21: qty 20

## 2016-06-21 MED ORDER — LACTATED RINGERS IV SOLN
INTRAVENOUS | Status: DC | PRN
  Administered 2016-06-21 (×2): via INTRAVENOUS

## 2016-06-21 MED ORDER — ATENOLOL 50 MG PO TABS
25.0000 mg | ORAL_TABLET | Freq: Every day | ORAL | Status: DC
  Administered 2016-06-22 – 2016-06-24 (×3): 25 mg via ORAL
  Filled 2016-06-21 (×3): qty 1

## 2016-06-21 MED ORDER — ONDANSETRON HCL 4 MG/2ML IJ SOLN: 4.0000 mg | Freq: Once | INTRAMUSCULAR | Status: DC | PRN

## 2016-06-21 MED ORDER — ALUM & MAG HYDROXIDE-SIMETH 200-200-20 MG/5ML PO SUSP: 30.0000 mL | Freq: Four times a day (QID) | ORAL | Status: DC | PRN

## 2016-06-21 MED ORDER — SODIUM CHLORIDE 0.9 % IJ SOLN
INTRAMUSCULAR | Status: DC | PRN
  Administered 2016-06-21: 20 mL

## 2016-06-21 MED ORDER — LIDOCAINE 2% (20 MG/ML) 5 ML SYRINGE
INTRAMUSCULAR | Status: DC | PRN
  Administered 2016-06-21: 1.5 mg/kg/h via INTRAVENOUS

## 2016-06-21 MED ORDER — GABAPENTIN 300 MG PO CAPS: 300.0000 mg | ORAL_CAPSULE | ORAL | Status: DC

## 2016-06-21 MED ORDER — CEFAZOLIN SODIUM-DEXTROSE 2-4 GM/100ML-% IV SOLN
2.0000 g | INTRAVENOUS | Status: AC
  Administered 2016-06-21: 2 g via INTRAVENOUS
  Filled 2016-06-21: qty 100

## 2016-06-21 MED ORDER — VITAMIN C 500 MG PO TABS
250.0000 mg | ORAL_TABLET | Freq: Every day | ORAL | Status: DC
  Administered 2016-06-21 – 2016-06-24 (×4): 250 mg via ORAL
  Filled 2016-06-21 (×4): qty 1

## 2016-06-21 MED ORDER — PHENOL 1.4 % MT LIQD
2.0000 | OROMUCOSAL | Status: DC | PRN
  Filled 2016-06-21: qty 177

## 2016-06-21 MED ORDER — LIDOCAINE 2% (20 MG/ML) 5 ML SYRINGE
INTRAMUSCULAR | Status: AC
  Filled 2016-06-21: qty 10

## 2016-06-21 MED ORDER — FENTANYL CITRATE (PF) 100 MCG/2ML IJ SOLN
INTRAMUSCULAR | Status: AC
  Filled 2016-06-21: qty 2

## 2016-06-21 MED ORDER — ROCURONIUM BROMIDE 50 MG/5ML IV SOSY
PREFILLED_SYRINGE | INTRAVENOUS | Status: AC
  Filled 2016-06-21: qty 5

## 2016-06-21 MED ORDER — CHLORHEXIDINE GLUCONATE CLOTH 2 % EX PADS: 6.0000 | MEDICATED_PAD | Freq: Once | CUTANEOUS | Status: DC

## 2016-06-21 MED ORDER — SUGAMMADEX SODIUM 500 MG/5ML IV SOLN
INTRAVENOUS | Status: DC | PRN
  Administered 2016-06-21: 150 mg via INTRAVENOUS

## 2016-06-21 MED ORDER — KETAMINE HCL 10 MG/ML IJ SOLN
INTRAMUSCULAR | Status: DC | PRN
  Administered 2016-06-21: 5 mg via INTRAVENOUS
  Administered 2016-06-21 (×2): 10 mg via INTRAVENOUS

## 2016-06-21 MED ORDER — TRAMADOL HCL 50 MG PO TABS: 50.0000 mg | ORAL_TABLET | Freq: Four times a day (QID) | ORAL | 0 refills | Status: AC | PRN

## 2016-06-21 MED ORDER — KETAMINE HCL 10 MG/ML IJ SOLN
INTRAMUSCULAR | Status: AC
  Filled 2016-06-21: qty 1

## 2016-06-21 MED ORDER — FENTANYL CITRATE (PF) 100 MCG/2ML IJ SOLN
INTRAMUSCULAR | Status: DC | PRN
  Administered 2016-06-21 (×2): 50 ug via INTRAVENOUS

## 2016-06-21 MED ORDER — HYDROCODONE-ACETAMINOPHEN 5-325 MG PO TABS: 1.0000 | ORAL_TABLET | ORAL | 0 refills | Status: DC | PRN

## 2016-06-21 MED ORDER — TRAMADOL HCL 50 MG PO TABS: 50.0000 mg | ORAL_TABLET | Freq: Four times a day (QID) | ORAL | 0 refills | Status: DC | PRN

## 2016-06-21 MED ORDER — DEXAMETHASONE SODIUM PHOSPHATE 10 MG/ML IJ SOLN
INTRAMUSCULAR | Status: AC
  Filled 2016-06-21: qty 1

## 2016-06-21 MED ORDER — LACTATED RINGERS IV SOLN: INTRAVENOUS | Status: DC

## 2016-06-21 MED ORDER — SODIUM CHLORIDE 0.9% FLUSH
3.0000 mL | Freq: Two times a day (BID) | INTRAVENOUS | Status: DC
  Administered 2016-06-22 – 2016-06-23 (×3): 3 mL via INTRAVENOUS

## 2016-06-21 MED ORDER — LISINOPRIL 10 MG PO TABS
10.0000 mg | ORAL_TABLET | Freq: Every day | ORAL | Status: DC
  Administered 2016-06-21 – 2016-06-24 (×4): 10 mg via ORAL
  Filled 2016-06-21 (×4): qty 1

## 2016-06-21 MED ORDER — LACTATED RINGERS IR SOLN
Status: DC | PRN
  Administered 2016-06-21: 1000 mL

## 2016-06-21 MED ORDER — ACETAMINOPHEN 500 MG PO TABS
1000.0000 mg | ORAL_TABLET | ORAL | Status: AC
  Administered 2016-06-21: 1000 mg via ORAL
  Filled 2016-06-21: qty 2

## 2016-06-21 MED ORDER — HYOSCYAMINE SULFATE 0.125 MG PO TABS
0.1250 mg | ORAL_TABLET | Freq: Every day | ORAL | Status: DC
  Administered 2016-06-22 – 2016-06-24 (×3): 0.125 mg via ORAL
  Filled 2016-06-21 (×4): qty 1

## 2016-06-21 MED ORDER — FENTANYL CITRATE (PF) 100 MCG/2ML IJ SOLN
25.0000 ug | INTRAMUSCULAR | Status: DC | PRN
  Administered 2016-06-21: 50 ug via INTRAVENOUS
  Administered 2016-06-21 (×2): 25 ug via INTRAVENOUS

## 2016-06-21 MED ORDER — RALOXIFENE HCL 60 MG PO TABS
60.0000 mg | ORAL_TABLET | Freq: Every day | ORAL | Status: DC
  Administered 2016-06-22 – 2016-06-24 (×3): 60 mg via ORAL
  Filled 2016-06-21 (×4): qty 1

## 2016-06-21 MED ORDER — SODIUM CHLORIDE 0.9% FLUSH: 3.0000 mL | INTRAVENOUS | Status: DC | PRN

## 2016-06-21 MED ORDER — DEXAMETHASONE SODIUM PHOSPHATE 10 MG/ML IJ SOLN
INTRAMUSCULAR | Status: DC | PRN
  Administered 2016-06-21: 8 mg via INTRAVENOUS

## 2016-06-21 MED ORDER — PROCHLORPERAZINE EDISYLATE 5 MG/ML IJ SOLN
5.0000 mg | INTRAMUSCULAR | Status: DC | PRN
  Administered 2016-06-21: 10 mg via INTRAVENOUS
  Filled 2016-06-21: qty 2

## 2016-06-21 MED ORDER — CEFAZOLIN SODIUM-DEXTROSE 2-4 GM/100ML-% IV SOLN
INTRAVENOUS | Status: AC
  Filled 2016-06-21: qty 100

## 2016-06-21 MED ORDER — DIPHENHYDRAMINE HCL 50 MG/ML IJ SOLN: 12.5000 mg | Freq: Four times a day (QID) | INTRAMUSCULAR | Status: DC | PRN

## 2016-06-21 MED ORDER — PROPOFOL 10 MG/ML IV BOLUS
INTRAVENOUS | Status: AC
  Filled 2016-06-21: qty 20

## 2016-06-21 MED ORDER — HYDRALAZINE HCL 20 MG/ML IJ SOLN: 5.0000 mg | Freq: Four times a day (QID) | INTRAMUSCULAR | Status: DC | PRN

## 2016-06-21 MED ORDER — EPHEDRINE SULFATE-NACL 50-0.9 MG/10ML-% IV SOSY
PREFILLED_SYRINGE | INTRAVENOUS | Status: DC | PRN
  Administered 2016-06-21 (×3): 10 mg via INTRAVENOUS

## 2016-06-21 MED ORDER — LIP MEDEX EX OINT
1.0000 "application " | TOPICAL_OINTMENT | Freq: Two times a day (BID) | CUTANEOUS | Status: DC
  Administered 2016-06-21 – 2016-06-24 (×5): 1 via TOPICAL
  Filled 2016-06-21: qty 7

## 2016-06-21 MED ORDER — METOPROLOL TARTRATE 5 MG/5ML IV SOLN: 5.0000 mg | Freq: Four times a day (QID) | INTRAVENOUS | Status: DC | PRN

## 2016-06-21 MED ORDER — ONDANSETRON HCL 4 MG/2ML IJ SOLN
INTRAMUSCULAR | Status: DC | PRN
  Administered 2016-06-21: 4 mg via INTRAVENOUS

## 2016-06-21 MED ORDER — CARISOPRODOL 350 MG PO TABS
350.0000 mg | ORAL_TABLET | Freq: Four times a day (QID) | ORAL | Status: DC | PRN
  Administered 2016-06-22 – 2016-06-23 (×3): 350 mg via ORAL
  Filled 2016-06-21 (×3): qty 1

## 2016-06-21 MED ORDER — ROCURONIUM BROMIDE 10 MG/ML (PF) SYRINGE
PREFILLED_SYRINGE | INTRAVENOUS | Status: DC | PRN
  Administered 2016-06-21: 20 mg via INTRAVENOUS
  Administered 2016-06-21: 10 mg via INTRAVENOUS
  Administered 2016-06-21: 40 mg via INTRAVENOUS
  Administered 2016-06-21: 10 mg via INTRAVENOUS

## 2016-06-21 MED ORDER — DOXAZOSIN MESYLATE 2 MG PO TABS
2.0000 mg | ORAL_TABLET | Freq: Every day | ORAL | Status: DC
  Administered 2016-06-22 – 2016-06-24 (×3): 2 mg via ORAL
  Filled 2016-06-21 (×3): qty 1

## 2016-06-21 MED ORDER — DIPHENHYDRAMINE HCL 25 MG PO CAPS
25.0000 mg | ORAL_CAPSULE | Freq: Four times a day (QID) | ORAL | Status: DC | PRN
  Administered 2016-06-21: 25 mg via ORAL
  Filled 2016-06-21: qty 1

## 2016-06-21 MED ORDER — BUPIVACAINE LIPOSOME 1.3 % IJ SUSP
INTRAMUSCULAR | Status: DC | PRN
  Administered 2016-06-21: 20 mL

## 2016-06-21 MED ORDER — ASPIRIN EC 81 MG PO TBEC
81.0000 mg | DELAYED_RELEASE_TABLET | Freq: Every day | ORAL | Status: DC
  Administered 2016-06-21 – 2016-06-24 (×4): 81 mg via ORAL
  Filled 2016-06-21 (×4): qty 1

## 2016-06-21 MED ORDER — POLYETHYLENE GLYCOL 3350 17 G PO PACK: 17.0000 g | PACK | Freq: Two times a day (BID) | ORAL | Status: DC | PRN

## 2016-06-21 MED ORDER — ENALAPRILAT 1.25 MG/ML IV SOLN
0.6250 mg | Freq: Four times a day (QID) | INTRAVENOUS | Status: DC | PRN
  Filled 2016-06-21: qty 1

## 2016-06-21 MED ORDER — ONDANSETRON HCL 4 MG/2ML IJ SOLN
INTRAMUSCULAR | Status: AC
  Filled 2016-06-21: qty 2

## 2016-06-21 MED ORDER — MAGIC MOUTHWASH
15.0000 mL | Freq: Four times a day (QID) | ORAL | Status: DC | PRN
  Filled 2016-06-21: qty 15

## 2016-06-21 MED ORDER — VITAMIN E 180 MG (400 UNIT) PO CAPS
400.0000 [IU] | ORAL_CAPSULE | Freq: Every day | ORAL | Status: DC
  Administered 2016-06-21 – 2016-06-24 (×4): 400 [IU] via ORAL
  Filled 2016-06-21 (×4): qty 1

## 2016-06-21 MED ORDER — LIDOCAINE 2% (20 MG/ML) 5 ML SYRINGE
INTRAMUSCULAR | Status: DC | PRN
  Administered 2016-06-21: 50 mg via INTRAVENOUS

## 2016-06-21 MED ORDER — GABAPENTIN 400 MG PO CAPS
800.0000 mg | ORAL_CAPSULE | Freq: Three times a day (TID) | ORAL | Status: DC
  Administered 2016-06-21 – 2016-06-24 (×9): 800 mg via ORAL
  Filled 2016-06-21 (×9): qty 2

## 2016-06-21 MED ORDER — ACETAMINOPHEN 325 MG PO TABS
650.0000 mg | ORAL_TABLET | ORAL | Status: DC | PRN
  Administered 2016-06-22: 650 mg via ORAL
  Filled 2016-06-21 (×2): qty 2

## 2016-06-21 MED ORDER — SUGAMMADEX SODIUM 200 MG/2ML IV SOLN
INTRAVENOUS | Status: AC
  Filled 2016-06-21: qty 2

## 2016-06-21 MED ORDER — RED YEAST RICE 600 MG PO CAPS: 600.0000 mg | ORAL_CAPSULE | Freq: Every day | ORAL | Status: DC

## 2016-06-21 MED ORDER — BUPIVACAINE HCL (PF) 0.25 % IJ SOLN
INTRAMUSCULAR | Status: AC
  Filled 2016-06-21: qty 30

## 2016-06-21 MED ORDER — FENTANYL CITRATE (PF) 100 MCG/2ML IJ SOLN
25.0000 ug | INTRAMUSCULAR | Status: DC | PRN
  Administered 2016-06-21 (×4): 25 ug via INTRAVENOUS
  Administered 2016-06-22 (×7): 50 ug via INTRAVENOUS
  Administered 2016-06-22: 25 ug via INTRAVENOUS
  Administered 2016-06-23 – 2016-06-24 (×7): 50 ug via INTRAVENOUS
  Filled 2016-06-21 (×19): qty 2

## 2016-06-21 MED ORDER — VITAMIN B-1 100 MG PO TABS
100.0000 mg | ORAL_TABLET | Freq: Every day | ORAL | Status: DC
  Administered 2016-06-21 – 2016-06-24 (×4): 100 mg via ORAL
  Filled 2016-06-21 (×4): qty 1

## 2016-06-21 MED ORDER — SODIUM CHLORIDE 0.9 % IR SOLN
Status: DC | PRN
  Administered 2016-06-21: 1000 mL

## 2016-06-21 MED ORDER — POLYETHYLENE GLYCOL 3350 17 G PO PACK
17.0000 g | PACK | Freq: Two times a day (BID) | ORAL | Status: DC
  Administered 2016-06-21 – 2016-06-24 (×5): 17 g via ORAL
  Filled 2016-06-21 (×5): qty 1

## 2016-06-21 SURGICAL SUPPLY — 39 items
APPLIER CLIP 5 13 M/L LIGAMAX5 (MISCELLANEOUS)
BINDER ABDOMINAL 12 ML 46-62 (SOFTGOODS) ×2 IMPLANT
CABLE HIGH FREQUENCY MONO STRZ (ELECTRODE) ×2 IMPLANT
CHLORAPREP W/TINT 26ML (MISCELLANEOUS) ×2 IMPLANT
CLIP APPLIE 5 13 M/L LIGAMAX5 (MISCELLANEOUS) IMPLANT
COVER SURGICAL LIGHT HANDLE (MISCELLANEOUS) ×2 IMPLANT
DECANTER SPIKE VIAL GLASS SM (MISCELLANEOUS) ×2 IMPLANT
DEVICE SECURE STRAP 25 ABSORB (INSTRUMENTS) ×2 IMPLANT
DEVICE TROCAR PUNCTURE CLOSURE (ENDOMECHANICALS) ×2 IMPLANT
DRAPE WARM FLUID 44X44 (DRAPE) ×2 IMPLANT
DRSG TEGADERM 2-3/8X2-3/4 SM (GAUZE/BANDAGES/DRESSINGS) ×2 IMPLANT
DRSG TEGADERM 4X4.75 (GAUZE/BANDAGES/DRESSINGS) ×2 IMPLANT
ELECT REM PT RETURN 9FT ADLT (ELECTROSURGICAL) ×2
ELECTRODE REM PT RTRN 9FT ADLT (ELECTROSURGICAL) ×1 IMPLANT
GAUZE SPONGE 2X2 8PLY STRL LF (GAUZE/BANDAGES/DRESSINGS) ×1 IMPLANT
GLOVE ECLIPSE 8.0 STRL XLNG CF (GLOVE) ×2 IMPLANT
GLOVE INDICATOR 8.0 STRL GRN (GLOVE) ×2 IMPLANT
GOWN STRL REUS W/TWL XL LVL3 (GOWN DISPOSABLE) ×4 IMPLANT
IRRIG SUCT STRYKERFLOW 2 WTIP (MISCELLANEOUS)
IRRIGATION SUCT STRKRFLW 2 WTP (MISCELLANEOUS) IMPLANT
KIT BASIN OR (CUSTOM PROCEDURE TRAY) ×2 IMPLANT
MARKER SKIN DUAL TIP RULER LAB (MISCELLANEOUS) ×2 IMPLANT
MESH VENTRALIGHT ST 8X10 (Mesh General) ×2 IMPLANT
NEEDLE SPNL 22GX3.5 QUINCKE BK (NEEDLE) IMPLANT
PAD POSITIONING PINK XL (MISCELLANEOUS) ×2 IMPLANT
SCISSORS LAP 5X35 DISP (ENDOMECHANICALS) ×2 IMPLANT
SET IRRIG TUBING LAPAROSCOPIC (IRRIGATION / IRRIGATOR) ×4 IMPLANT
SHEARS HARMONIC ACE PLUS 36CM (ENDOMECHANICALS) ×2 IMPLANT
SLEEVE XCEL OPT CAN 5 100 (ENDOMECHANICALS) ×6 IMPLANT
SPONGE GAUZE 2X2 STER 10/PKG (GAUZE/BANDAGES/DRESSINGS) ×1
STRIP CLOSURE SKIN 1/2X4 (GAUZE/BANDAGES/DRESSINGS) ×2 IMPLANT
SUT MNCRL AB 4-0 PS2 18 (SUTURE) ×4 IMPLANT
SUT PDS AB 1 CT1 27 (SUTURE) ×8 IMPLANT
SUT PROLENE 1 CT 1 30 (SUTURE) ×12 IMPLANT
TOWEL OR 17X26 10 PK STRL BLUE (TOWEL DISPOSABLE) ×2 IMPLANT
TRAY LAPAROSCOPIC (CUSTOM PROCEDURE TRAY) ×2 IMPLANT
TROCAR BLADELESS OPT 5 100 (ENDOMECHANICALS) ×2 IMPLANT
TROCAR XCEL NON-BLD 11X100MML (ENDOMECHANICALS) IMPLANT
TUBING INSUF HEATED (TUBING) ×2 IMPLANT

## 2016-06-21 NOTE — H&P (View-Only) (Signed)
Elizabeth Mullins 05/06/2016 5:03 PM Location: [facility] Patient #: [id number] DOB: [birth year] Widowed / Language: [name] / Race: White Female  Patient Care Team: Loraine Leriche, MD as PCP - General (Internal Medicine) Michael Boston, MD as Consulting Physician (General Surgery)    History of Present Illness  The patient is a 83 year old female who presents with an abdominal mass. Note for "Abdominal mass": Patient comes in today for concern of new abdominal mass.  Pleasant 82 year old female. Head diverticulitis with an abscess. Underwent robotically assisted sigmoid colectomy earlier this year. Recovered rather well. Patient's been more active and helping with moving. She is no some lumps on her right side. It does not hurt. She has noted some pain on her left lower abdomen as well. Usually more with activity level. Usually goes away. She has not been to compliant on her MiraLAX. Sometimes she feels more discomfort when she is constipated. Then a gets better. No nausea or vomiting. No fevers or chills. Energy level good. She does not smoke. Remaining active.    10/18/2015  PATIENT: [name] 83 y.o. female  Patient Care Team: Loraine Leriche, MD as PCP - General (Internal Medicine) Michael Boston, MD as Consulting Physician (General Surgery)  PRE-OPERATIVE DIAGNOSIS: Persistent sigmoid diverticulitis with recurrent abscesses  POST-OPERATIVE DIAGNOSIS: Persistent sigmoid diverticulitis with recurrent abscesses   PROCEDURE:   XI ROBOT ASSISTED LOW ANTERIOR RECTOSIGMOID RESECTION Robotic lysis of adhesions x 16min Robotic mobilization of the splenic flexure of the colon RIGID PROCTOSCOPY  SURGEON: Michael Boston, MD  ASSISTANT: Leighton Ruff, MD  ANESTHESIA: local and general  EBL: Total I/O In: 3000 [I.V.:3000] Out: 600 [Urine:300; Blood:300]  Delay start of Pharmacological VTE agent (>24hrs) due to surgical blood loss or  risk of bleeding: no  DRAINS: (19Fr) Blake drain(s) in the PELVIS  SPECIMEN: Source of Specimen:   1. Rectosigmoid colon with severe diverticulitis. Open end is proximal.  2. Anastomotic rings. Blue stitch is proximal anastomotic ring.  DISPOSITION OF SPECIMEN: PATHOLOGY  COUNTS: YES  PLAN OF CARE: Admit to inpatient  PATIENT DISPOSITION: PACU - hemodynamically stable.  INDICATION:   Pleasant elderly woman struggling with diverticulitis with recurrent episodes of pain. Developed abscess formation. Percutaneously drained. Recommendation made for surgery. Drain partially dislodged. New abscess. A drain. Better. I recommended surgical resection. Noted possibility need of ostomy. In discussion with the patient and daughter. Patient anxious but consolable.  The anatomy & physiology of the digestive tract was discussed. The pathophysiology was discussed. Natural history risks without surgery was discussed. I worked to give an overview of the disease and the frequent need to have multispecialty involvement. I feel the risks of no intervention will lead to serious problems that outweigh the operative risks; therefore, I recommended a partial colectomy to remove the pathology. Laparoscopic & open techniques were discussed.  Risks such as bleeding, infection, abscess, leak, reoperation, possible ostomy, hernia, heart attack, death, and other risks were discussed. I noted a good likelihood this will help address the problem. Goals of post-operative recovery were discussed as well. We will work to minimize complications. Educational materials on the pathology had been given in the office. Questions were answered.   The patient & daughter expressed understanding & wished to proceed with surgery.  OR FINDINGS:  Patient had very torturous inflamed corkscrew sigmoid colon. Very dense adhesion to left anterior pelvic rim. Fallopian tubes on both sides as well as  uterus and ovaries and densely adherent to the liver. Most likely  location of walled off abscess. Very foreshortened left colon mesentery requiring splenic flexure mobilization.  No obvious metastatic disease on visceral parietal peritoneum or liver.  The anastomosis rests 12 cm from the anal verge by rigid proctoscopy.  Diagnosis 1. Colon, segmental resection - DIVERTICULITIS WITH PERFORATION AND PERICOLONIC ABSCESS. - NO EVIDENCE OF MALIGNANCY. 2. Colon, resection margin (donut), anastomosis proxmial - UNREMARKABLE COLON. - NO INFLAMMATION OR MALIGNANCY. 3. Colon, resection margin (donut), anastomosis distal end - UNREMARKABLE COLON. - NO INFLAMMATION OR MALIGNANCY. Claudette Laws MD Pathologist, Electronic Signature (Case signed 10/20/2015) Specimen Eugenio Dollins and Clinical Information Specimen(s) Obtained: 1. Colon, segmental resection 2. Colon, resection margin (donut), anastomosis proxmial 3. Colon, resection margin (donut), anastomosis distal end Specimen Clinical Information 1. persistent sigmoid colectomy rigid proctoscopy (kp) Ramiro Pangilinan 1. Specimen: Intact portion of sigmoid colon with an open proximal margin and stapled distal margin. There is also a focally disrupted, 2.5 x 1.5 cm area located 8 cm from the proximal margin. Length: 27 cm in length and averaging 3.5 cm in diameter. Serosa: ragged dusky red-brown and disrupted. Contents: Opening reveals minimal fecal material. Mucosa/Wall: Glistening, tan-pink mucosal folds with a stenotic lumen and wall ranging from 0.5 to 1.5 cm in thickness. Sectioning reveals moderate to severe diverticular disease with a possible perforated diverticula in the disrupted area of the serosa. Also identified in this area is a irregularly shaped, 4.0 x 1.5 x 1.5 cm area of ragged red 1 of 2 FINAL for Louderback, Jazzie [id number]) Marlon Vonruden(continued) brown nodularity which contains an ovoid tan-white cut surface. Lymph nodes: Three lymph nodes are  grossly palpable in the attached fat ranging from 0.3 to 0.7 cm. Block Summary: A= proximal margin. B= distal margin. C= area of disrupted serosa. D-E= representative diverticula F-H= area of nodular ovoid tan white tissue attached to a disrupted area. I= two lymph nodes. J= larger lymph node bisected. 10 blocks total. 2. Received in formalin is a 2.5 x 2.0 x 1.0 cm ovoid, annular tan-pink tissue with blue suture material designating the proximal end. The specimen has a granular, tan-pink mucosa and a 0.4 cm lumen. Margin is taken en face and submitted in block 2A. 3. Received in formalin is a 2.5 x 1.5 x 1.2 cm ovoid portion of glistening, tan-pink mucosa with an open end and a stapled end. Representative sections of the margin are submitted in block 3A. (KF:gt, 10/20/15) Report signed out from the following location(s) Technical component and interpretation was performed at [facility] [place], [place], [place] [address]. CLIA #: [id number],   Problem List/Past Medical [name], MD; 05/06/2016 5:15 PM) ABSCESS OF SIGMOID COLON DUE TO DIVERTICULITIS (K57.20)  ENCOUNTER FOR PREOPERATIVE EXAMINATION FOR GENERAL SURGICAL PROCEDURE (Z01.818)  COLONIC FISTULA (K63.2)  S/P COLECTOMY (Z90.49)  VISIT FOR WOUND CHECK (Z51.89)  HX DIVERTICULITIS OF COLON - F/U PRN (Z87.19)   Past Surgical History [name], MD; 05/06/2016 5:15 PM) Appendectomy  Breast Biopsy  Right. Hysterectomy (not due to cancer) - Partial  Knee Surgery  Right. Tonsillectomy   Diagnostic Studies History [name], MD; 05/06/2016 5:15 PM) Colonoscopy  5-10 years ago  Allergies Benjiman Core, CMA; 05/06/2016 5:09 PM) OXYCODONE  Codeine Phosphate *ANALGESICS - OPIOID*  Demerol *ANALGESICS - OPIOID*  Dilaudid *ANALGESICS - OPIOID*  Morphine Sulfate (Concentrate) *ANALGESICS - OPIOID*  FentaNYL *ANALGESICS - OPIOID*  NSAIDs  Allergies Reconciled    Medication History (Armen Glenn, CMA; 05/06/2016 5:13 PM) ALPRAZolam (0.5MG  Tablet, Oral) Active. Carisoprodol (350MG  Tablet, Oral)  Active. Doxazosin Mesylate (2MG  Tablet, Oral) Active. Atenolol (50MG  Tablet, Oral) Active. Vigamox (0.5% Solution, Ophthalmic) Active. Raloxifene HCl (60MG  Tablet, Oral) Active. Gabapentin (800MG  Tablet, Oral) Active. PrednisoLONE Acetate (1% Suspension, Ophthalmic) Active. Medications Reconciled  Social History [name], MD; 05/06/2016 5:15 PM) Caffeine use  Coffee. No alcohol use  No drug use  Tobacco use  Never smoker.  Family History [name], MD; 05/06/2016 5:15 PM) Arthritis  Daughter, Mother, Sister. Hypertension  Mother. Prostate Cancer  Father.  Pregnancy / Birth History [name], MD; 05/06/2016 5:15 PM) Age at menarche  38 years.  Other Problems [name], MD; 05/06/2016 5:15 PM) Back Pain  Diverticulosis  High blood pressure   Vitals (Armen Glenn CMA; 05/06/2016 5:06 PM) 05/06/2016 5:05 PM Weight: 140.38 lb Height: 59in Body Surface Area: 1.59 m Body Mass Index: 28.35 kg/m  Temp.: 98.4F  Pulse: 83 (Regular)  P.OX: 95% (Room air) BP: 126/72 (Sitting, Left Arm, Standard)       Physical Exam [name], MD; 05/06/2016 5:15 PM) General Mental Status-Alert. General Appearance-Not in acute distress. Voice-Normal. Note: Relaxed. Nontoxic.   Integumentary Global Assessment Normal Exam - Distribution of scalp and body hair is normal. General Characteristics Overall Skin Surface - no rashes and no suspicious lesions.  Head and Neck Head-normocephalic, atraumatic with no lesions or palpable masses. Face Global Assessment - atraumatic, no absence of expression. Neck Global Assessment - no abnormal movements, no decreased range of motion. Trachea-midline. Thyroid Gland Characteristics - non-tender.  Eye Eyeball - Left-Extraocular  movements intact, No Nystagmus. Eyeball - Right-Extraocular movements intact, No Nystagmus. Upper Eyelid - Left-No Cyanotic. Upper Eyelid - Right-No Cyanotic.  Chest and Lung Exam Inspection Accessory muscles - No use of accessory muscles in breathing.  Abdomen Note: Incisions with normal healing ridges. No hernia. No cellulitis. No guarding/rebound tenderness   Peripheral Vascular Upper Extremity Inspection - Left - Not Gangrenous, No Petechiae. Right - Not Gangrenous, No Petechiae.  Neurologic Neurologic evaluation reveals -normal attention span and ability to concentrate, able to name objects and repeat phrases. Appropriate fund of knowledge and normal coordination.  Neuropsychiatric Mental status exam performed with findings of-able to articulate well with normal speech/language, rate, volume and coherence and no evidence of hallucinations, delusions, obsessions or homicidal/suicidal ideation. Orientation-oriented X3.  Musculoskeletal Global Assessment Gait and Station - normal gait and station.  Lymphatic General Lymphatics Description - No Generalized lymphadenopathy.    Assessment & PlanINCISIONAL HERNIA, WITHOUT OBSTRUCTION OR GANGRENE (K43.2) Impression: Small but definite incisional hernia at the right sided extraction point. Standard of care would be repair of hernia. Stitches failed, so therefore do mesh. Underlay repair.  Because it is small and not bothersome, she wishes to hold off on any surgery. Should it become larger or bother her, she will reconsider and let us know. Current Plans Pt Education - [facility] Free Text Education/Instructions: discussed with patient and provided information. LLQ PAIN (R10.32) Impression: Mild but definite left lower quadrant pain. Perhaps related to constipation. I stressed that she needs to be more regular on her MiraLAX to avoid constipation and discomfort.  Another possibility is muscle wall strain since it seems  to be activity related. Recommended heat and anti-inflammatories. It her kidneys, would do Tylenol. That should go away by 3 months. If it is worsening, more suspicious for hernia.  Another possibility is a left inguinal hernia. Nothing definite on exam. Should she have a hernia repair of her incisional hernia, would do diagnostic laparoscopy to  look on that side and fix it if I find it. If that is negative, that is reassuring. Current Plans Pt Education - CCS Pain control - tylenol only: discussed with patient and provided information. ENCOUNTER FOR PREOPERATIVE EXAMINATION FOR GENERAL SURGICAL PROCEDURE (Z01.818) Current Plans Written instructions provided The anatomy & physiology of the abdominal wall was discussed. The pathophysiology of hernias was discussed. Natural history risks without surgery including progeressive enlargement, pain, incarceration, & strangulation was discussed. Contributors to complications such as smoking, obesity, diabetes, prior surgery, etc were discussed.  I feel the risks of no intervention will lead to serious problems that outweigh the operative risks; therefore, I recommended surgery to reduce and repair the hernia. I explained laparoscopic techniques with possible need for an open approach. I noted the probable use of mesh to patch and/or buttress the hernia repair  Risks such as bleeding, infection, abscess, need for further treatment, heart attack, death, and other risks were discussed. I noted a good likelihood this will help address the problem. Goals of post-operative recovery were discussed as well. Possibility that this will not correct all symptoms was explained. I stressed the importance of low-impact activity, aggressive pain control, avoiding constipation, & not pushing through pain to minimize risk of post-operative chronic pain or injury. Possibility of reherniation especially with smoking, obesity, diabetes, immunosuppression, and other health  conditions was discussed. We will work to minimize complications.  An educational handout further explaining the pathology & treatment options was given as well. Questions were answered. The patient expresses understanding & wishes to proceed with surgery.  Pt Education - [facility] Hernia Post-Op HCI (Garrie Woodin): discussed with patient and provided information. Pt Education - [facility] Pain Control (Lenzie Montesano) Pt Education - Pamphlet Given - Laparoscopic Hernia Repair: discussed with patient and provided information. HX DIVERTICULITIS OF COLON - F/U PRN (Z87.19) Current Plans Return to clinic as needed.  Soreness, decreased appetite, and poor energy level are common problems after surgery. While many people can struggle with a bad day, these concerns should gradually fade away or at least improve. Much of your recovery depends on your health & the severity of your operation. Please call if you have any further questions / concerns related to surgery.  Increase activity as tolerated to regular everyday activity. Consider daily low impact exercise every day such as walking an hour a day.  Do not push through pain. If it hurts to do it, then don't do it.  Diet as tolerated. Low fat high fiber diet ideal. 30 g fiber a day ideal. Consider taking a daily fiber supplement to keep your bowels regular.  Followup with your primary care physician for other health issues as would normally be done.  Consider screening for malignancies (breast, prostate, colon, melanoma, etc) as appropriate. Discuss with you primary care physician.  Pt Education - C[facility] Diverticular Disease (AT) Pt Education - [facility] Good Bowel Health (Izen Petz)  Addendum: Patient wishes to reconsider surgery.  Plan laparoscopic hernia repair with underlay mesh.  Palpation versus overnight stenting depending how she does  [name], M.D., F.A.C.S. Gastrointestinal and Minimally Invasive Surgery [facility] [address] [place], [place] [address] [phone] Main / Paging

## 2016-06-21 NOTE — Op Note (Signed)
06/21/2016  PATIENT:  Elizabeth Mullins  83 y.o. female  Patient Care Team: Loraine Leriche, MD as PCP - General (Internal Medicine) Michael Boston, MD as Consulting Physician (General Surgery)  PRE-OPERATIVE DIAGNOSIS:  Incisional hernia in abdomen  POST-OPERATIVE DIAGNOSIS:    Incisional hernia in right lower quadrant abdomen Right inguinal hernia  PROCEDURE:    LAPAROSCOPIC VENTRAL WALL HERNIA REPAIR LAPAROSCOPIC RIGHT INGUINAL HERNIA REPAIR INSERTION OF MESH  SURGEON:  [name], MD  ASSISTANT: Edward Qualia, PA-S, [employer]   ANESTHESIA:     General  Local anesthesia field block: (0.25% bupivacaine & liposomal  Bupivacaine [Experel])  EBL:  Total I/O In: 1000 [I.V.:1000] Out: 250 [Urine:200; Blood:50]  Per anesthesia record  Delay start of Pharmacological VTE agent (>24hrs) due to surgical blood loss or risk of bleeding:  no  DRAINS: none   SPECIMEN:  No Specimen  DISPOSITION OF SPECIMEN:  N/A  COUNTS:  YES  PLAN OF CARE: Admit for overnight observation  PATIENT DISPOSITION:  PACU - hemodynamically stable.  INDICATION: Pleasant patient has developed a ventral wall abdominal hernia.   Recommendation was made for surgical repair:  The anatomy & physiology of the abdominal wall was discussed. The pathophysiology of hernias was discussed. Natural history risks without surgery including progeressive enlargement, pain, incarceration & strangulation was discussed. Contributors to complications such as smoking, obesity, diabetes, prior surgery, etc were discussed.  I feel the risks of no intervention will lead to serious problems that outweigh the operative risks; therefore, I recommended surgery to reduce and repair the hernia. I explained laparoscopic techniques with possible need for an open approach. I noted the probable use of mesh to patch and/or buttress the hernia repair  Risks such as bleeding, infection, abscess, need for further treatment, heart  attack, death, and other risks were discussed. I noted a good likelihood this will help address the problem. Goals of post-operative recovery were discussed as well. Possibility that this will not correct all symptoms was explained. I stressed the importance of low-impact activity, aggressive pain control, avoiding constipation, & not pushing through pain to minimize risk of post-operative chronic pain or injury. Possibility of reherniation especially with smoking, obesity, diabetes, immunosuppression, and other health conditions was discussed. We will work to minimize complications.  An educational handout further explaining the pathology & treatment options was given as well. Questions were answered. The patient expresses understanding & wishes to proceed with surgery.   OR FINDINGS: 6 x 5 cm incisional hernia at extraction point from prior right colectomy already dilated.  Small but definite right indirect inguinal hernia with preperitoneal fat.  Type of repair: Laparoscopic underlay repair   Placement of mesh: Intraperitoneal underlay on medial half and preperitoneal along pelvis and right flank   Name of mesh: Bard Ventralight dual sided (polypropylene / Seprafilm)  Size of mesh: 25x20cm  Orientation: Oblique  Mesh overlap:  5-7cm   DESCRIPTION:   Informed consent was confirmed. The patient underwent general anaesthesia without difficulty. The patient was positioned appropriately.  Placed on a beanbag slightly 45 up with careful positioning and kidney rest to help flex of the right flank.   VTE prevention in place. The patient's abdomen was clipped, prepped, & draped in a sterile fashion. Surgical timeout confirmed our plan.  The patient was positioned in reverse Trendelenburg. Abdominal entry was gained using optical entry technique in the left upper abdomen. Entry was clean. I induced carbon dioxide insufflation. Camera inspection revealed no injury. Extra ports were carefully  placed  under direct laparoscopic visualization.   I could see the hernia on the prietal peritoneum under the abdominal wall.  I did laparoscopic lysis of adhesions to expose the entire anterior abdominal wall.  I went into the preperitoneal plane just lateral to the hernia itself.  Continue that towards the posterior axillary line, rotating the right colon off in a lateral to medial fashion.  I exposed mildly tortuous external iliac artery.  Reduced a small but definite indirect hernia.  Came down towards the obturator foramen.  No definite hernia there.  No femoral hernia.  I primarily used focused sharp dissection.    I made sure hemostasis was good.    I mapped out the region using a needle passer.   To ensure that I would have at least 5 cm radial coverage outside of the incisional hernia defect as well as cover the right indirect inguinal hernia defect, I chose a 25x20cm dual sided mesh.  I placed #1 Prolene stitches around the superior and medial edges of the circumference of the ellipse about every 5 cm = 9 total.  I rolled the mesh & placed into the peritoneal cavity through the incisional hernia defect.  I unrolled the mesh and positioned it obliquely such that the most inferior point laid over the right pelvic brim covering up the operating room femoral foramina and direct space.  Covered the indirect inguinal hernia.  The other corner of the ellipse pointed in the epigastric region.  I secured the mesh to cover up the hernia defect using a laparoscopic suture passer to pass the tails of the Prolene through the abdominal wall & tagged them with clamps for good transfascial suturing.  I started out in four corners to make sure I had the mesh centered under the hernia defect appropriately, and then proceeded to work in quadrants.     We evacuated CO2 & desufflated the abdomen.  I tied the fascial stitches down. I closed the fascial defect that I placed the mesh through using #1 PDS interrupted transverse  stitches primarily.  I reinsufflated the abdomen. The mesh provided at least circumferential coverage around the entire region of hernia defects.  I secured the mesh centrally with an additional trans fascial stitch in & out the mesh using #1 PDS under laparoscopic visualization x 2.   I placed some additional #1 Prolene sutures along the right iliac crest and right posterior axillary line to better secure the posterior lateral and right groin regions.   I used these extra laparoscopically directed sutures to help re-tack the peritoneum up to cover the lateral half of the mesh along the preperitoneal space.  I tacked the edges & central part of the mesh to the peritoneum/posterior rectus fascia with SecureStrap absorbable tacks.   I did reinspection. Hemostasis was good. Mesh laid well. I completed a broad field block of local anesthesia at fascial stitch sites & fascial closure areas.    Capnoperitoneum was evacuated. Ports were removed. The skin was closed with Monocryl at the port sites and Steri-Strips on the fascial stitch puncture sites.  Patient is being extubated to go to the recovery room.  I discussed operative findings, updated the patient's status, discussed probable steps to recovery, and gave postoperative recommendations to the Patient's daughter.  Recommendations were made.  Questions were answered.  She expressed understanding & appreciation.  [name], M.D., F.A.C.S. Gastrointestinal and Minimally Invasive Surgery [facility] [address] [place], [place] [address] [phone]  [phone] Main / Paging  06/21/2016 10:35 AM

## 2016-06-21 NOTE — Anesthesia Postprocedure Evaluation (Addendum)
Anesthesia Post Note  Patient: Shoniqua Evjen  Procedure(s) Performed: Procedure(s) (LRB): LAPAROSCOPIC VENTRAL WALL HERNIA REPAIR WITH ERAS PATHWAY (N/A) INSERTION OF MESH (N/A)  Patient location during evaluation: PACU Anesthesia Type: General Level of consciousness: awake, awake and alert and oriented Pain management: pain level controlled Vital Signs Assessment: post-procedure vital signs reviewed and stable Respiratory status: spontaneous breathing, nonlabored ventilation and respiratory function stable Cardiovascular status: blood pressure returned to baseline Anesthetic complications: no       Last Vitals:  Vitals:   06/21/16 1416 06/21/16 1509  BP: 139/71 (!) 151/80  Pulse: 76 81  Resp: 13 13  Temp: 36.3 C 36.4 C    Last Pain:  Vitals:   06/21/16 1509  TempSrc: Oral  PainSc:                  Micalah Cabezas COKER

## 2016-06-21 NOTE — Discharge Instructions (Signed)
[facility]ERNIA REPAIR: POST OP INSTRUCTIONS ° °###################################################################### ° °EAT °Gradually transition to a high fiber diet with a fiber supplement over the next few weeks after discharge.  Start with a pureed / full liquid diet (see below) ° °WALK °Walk an hour a day.  Control your pain to do that.   ° °CONTROL PAIN °Control pain so that you can walk, sleep, tolerate sneezing/coughing, go up/down stairs. ° °HAVE A BOWEL MOVEMENT DAILY °Keep your bowels regular to avoid problems.  OK to try a laxative to override constipation.  OK to use an antidairrheal to slow down diarrhea.  Call if not better after 2 tries ° °CALL IF YOU HAVE PROBLEMS/CONCERNS °Call if you are still struggling despite following these instructions. °Call if you have concerns not answered by these instructions ° °###################################################################### ° ° ° °1. DIET: Follow a light bland diet the first 24 hours after arrival home, such as soup, liquids, crackers, etc.  Be sure to include lots of fluids daily.  Avoid fast food or heavy meals as your are more likely to get nauseated.  Eat a low fat the next few days after surgery. °2. Take your usually prescribed home medications unless otherwise directed. °3. PAIN CONTROL: °a. Pain is best controlled by a usual combination of three different methods TOGETHER: °i. Ice/Heat °ii. Over the counter pain medication °iii. Prescription pain medication °b. Most patients will experience some swelling and bruising around the hernia(s) such as the bellybutton, groins, or old incisions.  Ice packs or heating pads (30-60 minutes up to 6 times a day) will help. Use ice for the first few days to help decrease swelling and bruising, then switch to heat to help relax tight/sore spots and speed recovery.  Some people prefer to use ice alone, heat alone, alternating between ice & heat.  Experiment to what works for you.  Swelling and bruising can take  several weeks to resolve.   °c. It is helpful to take an over-the-counter pain medication regularly for the first few weeks.  Choose one of the following that works best for you: °i. Naproxen (Aleve, etc)  Two 220mg tabs twice a day °ii. Ibuprofen (Advil, etc) Three 200mg tabs four times a day (every meal & bedtime) °iii. Acetaminophen (Tylenol, etc) 325-650mg four times a day (every meal & bedtime) °d. A  prescription for pain medication should be given to you upon discharge.  Take your pain medication as prescribed.  °i. If you are having problems/concerns with the prescription medicine (does not control pain, nausea, vomiting, rash, itching, etc), please call us [phone] to see if we need to switch you to a different pain medicine that will work better for you and/or control your side effect better. °ii. If you need a refill on your pain medication, please contact your pharmacy.  They will contact our office to request authorization. Prescriptions will not be filled after 5 pm or on week-ends. °4. Avoid getting constipated.  Between the surgery and the pain medications, it is common to experience some constipation.  Increasing fluid intake and taking a fiber supplement (such as Metamucil, Citrucel, FiberCon, MiraLax, etc) 1-2 times a day regularly will usually help prevent this problem from occurring.  A mild laxative (prune juice, Milk of Magnesia, MiraLax, etc) should be taken according to package directions if there are no bowel movements after 48 hours.   °5. Wash / shower every day.  You may shower over the dressings as they are waterproof.   °6. Remove   your waterproof bandages 5 days after surgery.  You may leave the incision open to air.  You may replace a dressing/Band-Aid to cover the incision for comfort if you wish.  Continue to shower over incision(s) after the dressing is off. ° ° ° °7. ACTIVITIES as tolerated:   °a. You may resume regular (light) daily activities beginning the next day--such  as daily self-care, walking, climbing stairs--gradually increasing activities as tolerated.  If you can walk 30 minutes without difficulty, it is safe to try more intense activity such as jogging, treadmill, bicycling, low-impact aerobics, swimming, etc. °b. Save the most intensive and strenuous activity for last such as sit-ups, heavy lifting, contact sports, etc  Refrain from any heavy lifting or straining until you are off narcotics for pain control.   °c. DO NOT PUSH THROUGH PAIN.  Let pain be your guide: If it hurts to do something, don't do it.  Pain is your body warning you to avoid that activity for another week until the pain goes down. °d. You may drive when you are no longer taking prescription pain medication, you can comfortably wear a seatbelt, and you can safely maneuver your car and apply brakes. °e. You may have sexual intercourse when it is comfortable.  °8. FOLLOW UP in our office °a. Please call [facility] at [phone] to set up an appointment to see your surgeon in the office for a follow-up appointment approximately 2-3 weeks after your surgery. °b. Make sure that you call for this appointment the day you arrive home to insure a convenient appointment time. °9.  IF YOU HAVE DISABILITY OR FAMILY LEAVE FORMS, BRING THEM TO THE OFFICE FOR PROCESSING.  DO NOT GIVE THEM TO YOUR DOCTOR. ° °WHEN TO CALL US [phone]: °1. Poor pain control °2. Reactions / problems with new medications (rash/itching, nausea, etc)  °3. Fever over 101.5 F (38.5 C) °4. Inability to urinate °5. Nausea and/or vomiting °6. Worsening swelling or bruising °7. Continued bleeding from incision. °8. Increased pain, redness, or drainage from the incision ° ° The clinic staff is available to answer your questions during regular business hours (8:30am-5pm).  Please don’t hesitate to call and ask to speak to one of our nurses for clinical concerns.  ° If you have a medical emergency, go to the nearest emergency room or call  911. ° A surgeon from [facility] is always on call at the hospitals in [place] ° °[facility] °[address], [address], [place], [place]  [address] ? ° [address] [address], [place],    [address] °MAIN: [phone] ? TOLL FREE: [phone] ? FAX: [phone] °[email or web address] ° ° °

## 2016-06-21 NOTE — Interval H&P Note (Signed)
History and Physical Interval Note:  06/21/2016 7:12 AM  Elizabeth Mullins  has presented today for surgery, with the diagnosis of Incisional hernia in abdomen  The various methods of treatment have been discussed with the patient and family. After consideration of risks, benefits and other options for treatment, the patient has consented to  Procedure(s): [place] (N/A) INSERTION OF MESH (N/A) as a surgical intervention .  The patient's history has been reviewed, patient examined, no change in status, stable for surgery.  I have reviewed the patient's chart and labs.  Questions were answered to the patient's satisfaction.     [name]

## 2016-06-21 NOTE — Anesthesia Preprocedure Evaluation (Addendum)
Anesthesia Evaluation  Patient identified by MRN, date of birth, ID band Patient awake    Reviewed: Allergy & Precautions, NPO status , Patient's Chart, lab work & pertinent test results  Airway Mallampati: II  TM Distance: >3 FB     Dental  (+) Teeth Intact, Dental Advisory Given   Pulmonary    breath sounds clear to auscultation       Cardiovascular hypertension,  Rhythm:Regular Rate:Normal     Neuro/Psych    GI/Hepatic   Endo/Other    Renal/GU      Musculoskeletal   Abdominal   Peds  Hematology   Anesthesia Other Findings   Reproductive/Obstetrics                             Anesthesia Physical Anesthesia Plan  ASA: III  Anesthesia Plan: General   Post-op Pain Management:    Induction: Intravenous  Airway Management Planned: Oral ETT  Additional Equipment:   Intra-op Plan:   Post-operative Plan: Extubation in OR  Informed Consent: I have reviewed the patients History and Physical, chart, labs and discussed the procedure including the risks, benefits and alternatives for the proposed anesthesia with the patient or authorized representative who has indicated his/her understanding and acceptance.   Dental advisory given  Plan Discussed with: CRNA and Anesthesiologist  Anesthesia Plan Comments:         Anesthesia Quick Evaluation

## 2016-06-21 NOTE — Progress Notes (Signed)
PHARMACIST - PHYSICIAN ORDER COMMUNICATION  CONCERNING: P&T Medication Policy on Herbal Medications  DESCRIPTION:  This patient's order for: Red yeast rice capsules  has been noted.  This product(s) is classified as an "herbal" or natural product. Due to a lack of definitive safety studies or FDA approval, nonstandard manufacturing practices, plus the potential risk of unknown drug-drug interactions while on inpatient medications, the Pharmacy and Therapeutics Committee does not permit the use of "herbal" or natural products of this type within [facility].   ACTION TAKEN: The pharmacy department is unable to verify this order at this time and your patient has been informed of this safety policy. Please reevaluate patient's clinical condition at discharge and address if the herbal or natural product(s) should be resumed at that time.

## 2016-06-21 NOTE — Transfer of Care (Signed)
Immediate Anesthesia Transfer of Care Note  Patient: Elizabeth Mullins  Procedure(s) Performed: Procedure(s): [place] (N/A) INSERTION OF MESH (N/A)  Patient Location: PACU  Anesthesia Type:General  Level of Consciousness: awake, alert  and oriented  Airway & Oxygen Therapy: Patient Spontanous Breathing and Patient connected to face mask oxygen  Post-op Assessment: Report given to RN and Post -op Vital signs reviewed and stable  Post vital signs: Reviewed and stable  Last Vitals:  Vitals:   06/21/16 0606  BP: 139/76  Pulse: 80  Resp: 16  Temp: 36.6 C    Last Pain:  Vitals:   06/21/16 0612  TempSrc:   PainSc: 5       Patients Stated Pain Goal: 2 ([id number])  Complications: No apparent anesthesia complications

## 2016-06-21 NOTE — Anesthesia Procedure Notes (Signed)
Procedure Name: Intubation Date/Time: 06/21/2016 7:36 AM Performed by: Noralyn Pick D Pre-anesthesia Checklist: Patient identified, Emergency Drugs available, Suction available and Patient being monitored Patient Re-evaluated:Patient Re-evaluated prior to inductionOxygen Delivery Method: Circle system utilized Preoxygenation: Pre-oxygenation with 100% oxygen Intubation Type: IV induction Ventilation: Mask ventilation without difficulty Laryngoscope Size: Mac and 3 Grade View: Grade II Tube type: Oral Tube size: 7.5 mm Number of attempts: 1 Airway Equipment and Method: Stylet Placement Confirmation: ETT inserted through vocal cords under direct vision,  positive ETCO2 and breath sounds checked- equal and bilateral Secured at: 21 cm Tube secured with: Tape Dental Injury: Teeth and Oropharynx as per pre-operative assessment

## 2016-06-22 DIAGNOSIS — Z888 Allergy status to other drugs, medicaments and biological substances status: Secondary | ICD-10-CM | POA: Diagnosis not present

## 2016-06-22 DIAGNOSIS — F419 Anxiety disorder, unspecified: Secondary | ICD-10-CM | POA: Diagnosis present

## 2016-06-22 DIAGNOSIS — Z7952 Long term (current) use of systemic steroids: Secondary | ICD-10-CM | POA: Diagnosis not present

## 2016-06-22 DIAGNOSIS — Z79899 Other long term (current) drug therapy: Secondary | ICD-10-CM | POA: Diagnosis not present

## 2016-06-22 DIAGNOSIS — K589 Irritable bowel syndrome without diarrhea: Secondary | ICD-10-CM | POA: Diagnosis present

## 2016-06-22 DIAGNOSIS — K5900 Constipation, unspecified: Secondary | ICD-10-CM | POA: Diagnosis present

## 2016-06-22 DIAGNOSIS — G4700 Insomnia, unspecified: Secondary | ICD-10-CM | POA: Diagnosis present

## 2016-06-22 DIAGNOSIS — M5412 Radiculopathy, cervical region: Secondary | ICD-10-CM | POA: Diagnosis present

## 2016-06-22 DIAGNOSIS — M159 Polyosteoarthritis, unspecified: Secondary | ICD-10-CM | POA: Diagnosis present

## 2016-06-22 DIAGNOSIS — Z885 Allergy status to narcotic agent status: Secondary | ICD-10-CM | POA: Diagnosis not present

## 2016-06-22 DIAGNOSIS — H918X3 Other specified hearing loss, bilateral: Secondary | ICD-10-CM | POA: Diagnosis present

## 2016-06-22 DIAGNOSIS — Z886 Allergy status to analgesic agent status: Secondary | ICD-10-CM | POA: Diagnosis not present

## 2016-06-22 DIAGNOSIS — Z8719 Personal history of other diseases of the digestive system: Secondary | ICD-10-CM

## 2016-06-22 DIAGNOSIS — Z792 Long term (current) use of antibiotics: Secondary | ICD-10-CM | POA: Diagnosis not present

## 2016-06-22 DIAGNOSIS — Z79810 Long term (current) use of selective estrogen receptor modulators (SERMs): Secondary | ICD-10-CM | POA: Diagnosis not present

## 2016-06-22 DIAGNOSIS — K869 Disease of pancreas, unspecified: Secondary | ICD-10-CM | POA: Diagnosis present

## 2016-06-22 DIAGNOSIS — R1904 Left lower quadrant abdominal swelling, mass and lump: Secondary | ICD-10-CM | POA: Diagnosis present

## 2016-06-22 DIAGNOSIS — Z96651 Presence of right artificial knee joint: Secondary | ICD-10-CM | POA: Diagnosis present

## 2016-06-22 DIAGNOSIS — Z9049 Acquired absence of other specified parts of digestive tract: Secondary | ICD-10-CM | POA: Diagnosis not present

## 2016-06-22 DIAGNOSIS — G8929 Other chronic pain: Secondary | ICD-10-CM | POA: Diagnosis present

## 2016-06-22 DIAGNOSIS — D329 Benign neoplasm of meninges, unspecified: Secondary | ICD-10-CM | POA: Diagnosis present

## 2016-06-22 DIAGNOSIS — K432 Incisional hernia without obstruction or gangrene: Secondary | ICD-10-CM | POA: Diagnosis present

## 2016-06-22 DIAGNOSIS — M419 Scoliosis, unspecified: Secondary | ICD-10-CM | POA: Diagnosis present

## 2016-06-22 DIAGNOSIS — I10 Essential (primary) hypertension: Secondary | ICD-10-CM | POA: Diagnosis present

## 2016-06-22 DIAGNOSIS — K4090 Unilateral inguinal hernia, without obstruction or gangrene, not specified as recurrent: Secondary | ICD-10-CM | POA: Diagnosis present

## 2016-06-22 DIAGNOSIS — Z8673 Personal history of transient ischemic attack (TIA), and cerebral infarction without residual deficits: Secondary | ICD-10-CM | POA: Diagnosis not present

## 2016-06-22 MED ORDER — ALPRAZOLAM 0.25 MG PO TABS
0.2500 mg | ORAL_TABLET | Freq: Every evening | ORAL | Status: DC | PRN
  Administered 2016-06-22 – 2016-06-23 (×2): 0.25 mg via ORAL
  Filled 2016-06-22 (×2): qty 1

## 2016-06-22 NOTE — Care Management Obs Status (Signed)
[facility] NOTIFICATION   Patient Details  Name: Elizabeth Mullins MRN: [id number] Date of Birth: 1931/10/29   Medicare Observation Status Notification Given:  Yes    Erenest Rasher, RN 06/22/2016, 10:14 AM

## 2016-06-22 NOTE — Progress Notes (Signed)
Patient ID: Elizabeth Mullins, female   DOB: Mar 26, 1932, 83 y.o.   MRN: [id number] [facility] Progress Note:   1 Day Post-Op  Subjective: Mental status is clear; she is having a lot of abdominal and ASIS pain Objective: Vital signs in last 24 hours: Temp:  [97.4 F (36.3 C)-98.7 F (37.1 C)] 98.7 F (37.1 C) (02/03 1013) Pulse Rate:  [66-93] 91 (02/03 1013) Resp:  [13-19] 16 (02/03 1013) BP: (97-151)/(45-95) 124/55 (02/03 1013) SpO2:  [97 %-100 %] 100 % (02/03 1013)  Intake/Output from previous day: 02/02 0701 - 02/03 0700 In: 2930 [P.O.:580; I.V.:1600; IV Piggyback:750] Out: 900 [Urine:850; Blood:50] Intake/Output this shift: Total I/O In: 120 [P.O.:120] Out: -   Physical Exam: Work of breathing is not labored.  Pain is worse this morning and is effecting her mobility  Lab Results:  No results found for this or any previous visit (from the past 48 hour(s)).  Radiology/Results: No results found.  Anti-infectives: Anti-infectives    Start     Dose/Rate Route Frequency Ordered Stop   06/21/16 0546  ceFAZolin (ANCEF) IVPB 2g/100 mL premix     2 g 200 mL/hr over 30 Minutes Intravenous On call to O.R. 06/21/16 0546 06/21/16 0738      Assessment/Plan: Problem List: Patient Active Problem List   Diagnosis Date Noted  . Incisional hernia RLQ s/p lap repair w mesh 06/21/2016 06/21/2016  . Right inguinal hernia s/p lap repair w mesh 06/21/2016 06/21/2016  . Insomnia 06/21/2016  . TIA (transient ischemic attack) 06/03/2016  . Cervical pain (neck) 10/19/2015  . Diverticulitis of large intestine with perforation and abscess without bleeding 10/18/2015  . Anxiety 10/12/2015  . Diverticulitis of colon with perforation 08/24/2015  . Abnormal urinalysis 08/24/2015  . ?? Pancreatic mass 08/24/2015  . IBS (irritable bowel syndrome) 08/24/2015  . Inflammatory arthropathy 08/24/2015  . Chronic back pain 08/24/2015  . Cochlear hearing loss, bilateral 06/08/2015  . Diverticulitis  large intestine 05/25/2015  . DD (diverticular disease) 10/31/2014  . Generalized osteoarthritis of multiple sites 10/31/2014  . Headache disorder 10/25/2014  . Meningioma ([place]) 10/25/2014  . Essential hypertension 10/04/2013  . Excess weight 10/04/2013  . Cervical radiculopathy 10/04/2013  . Degeneration of intervertebral disc of lumbar region 09/22/2013  . Lumbar radiculopathy 09/22/2013    Unable to go home.  Observe and treat pain.   1 Day Post-Op    LOS: 0 days   Matt B. Hassell Done, MD, [facility] [phone] beeper [phone]  06/22/2016 10:25 AM

## 2016-06-22 NOTE — Care Management Note (Signed)
Case Management Note  Patient Details  Name: Elizabeth Mullins MRN: [id number] Date of Birth: 27-Jun-1931  Subjective/Objective:   Ventral Hernia and Right Inguinal Hernia Repair                 Action/Plan: Discharge Planning: NCM spoke to pt and dtr, Manuela Schwartz at bedside. Dtr lives in home with pt. Pt was independent prior to hospital stay. Will continue to follow for dc needs. No needs anticipated.   PCP Loraine Leriche  Expected Discharge Date:  06/22/16               Expected Discharge Plan:  Home/Self Care  In-House Referral:  NA  Discharge planning Services  CM Consult  Post Acute Care Choice:  NA Choice offered to:  NA  DME Arranged:  N/A DME Agency:  NA  HH Arranged:  NA HH Agency:  NA  Status of Service:  Completed, signed off  If discussed at [place] of Stay Meetings, dates discussed:    Additional Comments:  Erenest Rasher, RN 06/22/2016, 10:15 AM

## 2016-06-23 LAB — CBC
HCT: 29.2 % — ABNORMAL LOW (ref 36.0–46.0)
Hemoglobin: 9.7 g/dL — ABNORMAL LOW (ref 12.0–15.0)
MCH: 30.5 pg (ref 26.0–34.0)
MCHC: 33.2 g/dL (ref 30.0–36.0)
MCV: 91.8 fL (ref 78.0–100.0)
PLATELETS: 249 10*3/uL (ref 150–400)
RBC: 3.18 MIL/uL — ABNORMAL LOW (ref 3.87–5.11)
RDW: 12.9 % (ref 11.5–15.5)
WBC: 7.6 10*3/uL (ref 4.0–10.5)

## 2016-06-23 LAB — CREATININE, SERUM
Creatinine, Ser: 0.62 mg/dL (ref 0.44–1.00)
GFR calc non Af Amer: >60 mL/min (ref >60)

## 2016-06-23 MED ORDER — HEPARIN SODIUM (PORCINE) 5000 UNIT/ML IJ SOLN
5000.0000 [IU] | Freq: Three times a day (TID) | INTRAMUSCULAR | Status: DC
  Administered 2016-06-23 – 2016-06-24 (×4): 5000 [IU] via SUBCUTANEOUS
  Filled 2016-06-23 (×6): qty 1

## 2016-06-23 NOTE — Progress Notes (Signed)
Patient ID: Elizabeth Mullins, female   DOB: April 09, 1932, 83 y.o.   MRN: [id number] [facility] Progress Note:   2 Days Post-Op  Subjective: Mental status is clear.  Pain is better but significant Objective: Vital signs in last 24 hours: Temp:  [98 F (36.7 C)-99 F (37.2 C)] 98 F (36.7 C) (02/04 0446) Pulse Rate:  [91-98] 98 (02/04 0446) Resp:  [16-18] 18 (02/04 0446) BP: (123-157)/(55-76) 157/76 (02/04 0446) SpO2:  [94 %-100 %] 94 % (02/04 0446)  Intake/Output from previous day: 02/03 0701 - 02/04 0700 In: 483 [P.O.:480; I.V.:3] Out: 1350 [Urine:1350] Intake/Output this shift: Total I/O In: -  Out: 200 [Urine:200]  Physical Exam: Work of breathing is not labored.  Using IS.  Incisions OK  Lab Results:  No results found for this or any previous visit (from the past 48 hour(s)).  Radiology/Results: No results found.  Anti-infectives: Anti-infectives    Start     Dose/Rate Route Frequency Ordered Stop   06/21/16 0546  ceFAZolin (ANCEF) IVPB 2g/100 mL premix     2 g 200 mL/hr over 30 Minutes Intravenous On call to O.R. 06/21/16 0546 06/21/16 0738      Assessment/Plan: Problem List: Patient Active Problem List   Diagnosis Date Noted  . H/O ventral hernia 06/22/2016  . Incisional hernia RLQ s/p lap repair w mesh 06/21/2016 06/21/2016  . Right inguinal hernia s/p lap repair w mesh 06/21/2016 06/21/2016  . Insomnia 06/21/2016  . TIA (transient ischemic attack) 06/03/2016  . Cervical pain (neck) 10/19/2015  . Diverticulitis of large intestine with perforation and abscess without bleeding 10/18/2015  . Anxiety 10/12/2015  . Diverticulitis of colon with perforation 08/24/2015  . Abnormal urinalysis 08/24/2015  . ?? Pancreatic mass 08/24/2015  . IBS (irritable bowel syndrome) 08/24/2015  . Inflammatory arthropathy 08/24/2015  . Chronic back pain 08/24/2015  . Cochlear hearing loss, bilateral 06/08/2015  . Diverticulitis large intestine 05/25/2015  . DD (diverticular  disease) 10/31/2014  . Generalized osteoarthritis of multiple sites 10/31/2014  . Headache disorder 10/25/2014  . Meningioma ([place]) 10/25/2014  . Essential hypertension 10/04/2013  . Excess weight 10/04/2013  . Cervical radiculopathy 10/04/2013  . Degeneration of intervertebral disc of lumbar region 09/22/2013  . Lumbar radiculopathy 09/22/2013    Pain better but she would have difficulty managing at home.   2 Days Post-Op    LOS: 1 day   Matt B. Hassell Done, MD, [facility] [phone] beeper [phone]  06/23/2016 8:43 AM

## 2016-06-24 ENCOUNTER — Encounter (HOSPITAL_COMMUNITY): Payer: Self-pay | Admitting: Surgery

## 2016-06-24 MED ORDER — HYDROCODONE-ACETAMINOPHEN 5-325 MG PO TABS
1.0000 | ORAL_TABLET | ORAL | Status: DC | PRN
  Administered 2016-06-24: 2 via ORAL
  Filled 2016-06-24 (×2): qty 2

## 2016-06-24 MED ORDER — OXYCODONE HCL 5 MG PO TABS: 5.0000 mg | ORAL_TABLET | ORAL | Status: DC | PRN

## 2016-06-24 NOTE — Discharge Summary (Signed)
Physician Discharge Summary  Patient ID: Elizabeth Mullins MRN: [id number] DOB/AGE: [birth year]  83 y.o.  Admit date: 06/21/2016 Discharge date:   Patient Care Team: Loraine Leriche, MD as PCP - General (Internal Medicine) Michael Boston, MD as Consulting Physician (General Surgery)  Discharge Diagnoses:  Principal Problem:   Incisional hernia RLQ s/p lap repair w mesh 06/21/2016 Active Problems:   IBS (irritable bowel syndrome)   Chronic back pain   Anxiety   Right inguinal hernia s/p lap repair w mesh 06/21/2016   Insomnia   H/O ventral hernia   POST-OPERATIVE DIAGNOSIS:    Incisional hernia in right lower quadrant abdomen Right inguinal hernia  PROCEDURE:    LAPAROSCOPIC VENTRAL WALL HERNIA REPAIR LAPAROSCOPIC RIGHT INGUINAL HERNIA REPAIR INSERTION OF MESH  SURGEON:    Surgeon(s): Michael Boston, MD  Consults: None  Hospital Course:   The patient underwent the surgery above.  Postoperatively, the patient gradually mobilized and advanced to a solid diet.  Pain and other symptoms were treated aggressively.    By the time of discharge, the patient was walking well the hallways, eating food, having flatus.  Pain was well-controlled on an oral medications.  Based on meeting discharge criteria and continuing to recover, I felt it was safe for the patient to be discharged from the hospital to further recover with close followup. Postoperative recommendations were discussed in detail.  They are written as well.  Discharged Condition: good  Disposition:  Follow-up Information    Oleg Oleson C., MD. Schedule an appointment as soon as possible for a visit in 2 weeks.   Specialty:  General Surgery Why:  To follow up after your operation, To follow up after your hospital stay Contact information: [place]  [phone]           01-Home or Self Care  Discharge Instructions    Call MD for:    Complete by:  As directed    FEVER  >101.5 F (Temperatures <101.544F occasionally happen and are not significant)   Call MD for:    Complete by:  As directed    FEVER >101.5 F (Temperatures <101.544F occasionally happen and are not significant)   Call MD for:  extreme fatigue    Complete by:  As directed    Call MD for:  extreme fatigue    Complete by:  As directed    Call MD for:  persistant dizziness or light-headedness    Complete by:  As directed    Call MD for:  persistant dizziness or light-headedness    Complete by:  As directed    Call MD for:  persistant nausea and vomiting    Complete by:  As directed    Call MD for:  persistant nausea and vomiting    Complete by:  As directed    Call MD for:  redness, tenderness, or signs of infection (pain, swelling, redness, odor or green/yellow discharge around incision site)    Complete by:  As directed    Call MD for:  redness, tenderness, or signs of infection (pain, swelling, redness, odor or green/yellow discharge around incision site)    Complete by:  As directed    Call MD for:  severe uncontrolled pain    Complete by:  As directed    Call MD for:  severe uncontrolled pain    Complete by:  As directed    Diet - low sodium heart healthy    Complete by:  As  directed    Follow a light diet the first few days at home.  Start with a bland diet such as soups, liquids, starchy foods, low fat foods, etc.   If you feel full, bloated, or constipated, stay on a full liquid or pureed/blenderized diet for a few days until you feel better and no longer constipated. Gradually get back to a regular solid diet.  Avoid fast food or heavy meals the first week as you are more likely to get nauseated.   Diet - low sodium heart healthy    Complete by:  As directed    Follow a light diet the first few days at home.  Start with a bland diet such as soups, liquids, starchy foods, low fat foods, etc.   If you feel full, bloated, or constipated, stay on a full liquid or pureed/blenderized diet  for a few days until you feel better and no longer constipated. Gradually get back to a regular solid diet.  Avoid fast food or heavy meals the first week as you are more likely to get nauseated.   Discharge instructions    Complete by:  As directed    One the day of your discharge from the hospital (or the next business weekday), please call [facility] to set up or confirm an appointment to see your surgeon in the office for a follow-up appointment.  Usually it is 2-3 weeks after your surgery.  Other concerns If you are not getting better after two weeks or are noticing you are getting worse, contact our office [phone] for further advice.  We may need to adjust your medications, re-evaluate you in the office, send you to the emergency room, or see what other things we can do to help. The clinic staff is available to answer your questions during regular business hours (8:30am-5pm).  Please don't hesitate to call and ask to speak to one of our nurses for clinical concerns.    A surgeon from [facility] is always on call at the hospitals 24 hours/day If you have a medical emergency, go to the nearest emergency room or call 911.   Discharge instructions    Complete by:  As directed    One the day of your discharge from the hospital (or the next business weekday), please call [facility] to set up or confirm an appointment to see your surgeon in the office for a follow-up appointment.  Usually it is 2-3 weeks after your surgery.  Other concerns If you are not getting better after two weeks or are noticing you are getting worse, contact our office [phone] for further advice.  We may need to adjust your medications, re-evaluate you in the office, send you to the emergency room, or see what other things we can do to help. The clinic staff is available to answer your questions during regular business hours (8:30am-5pm).  Please don't hesitate to call and  ask to speak to one of our nurses for clinical concerns.    A surgeon from [facility] is always on call at the hospitals 24 hours/day If you have a medical emergency, go to the nearest emergency room or call 911.   Driving Restrictions    Complete by:  As directed    You may drive when you are no longer taking prescription pain medication, you can comfortably wear a seatbelt, and you can safely maneuver your car and apply brakes.   Driving Restrictions  Complete by:  As directed    You may drive when you are no longer taking prescription pain medication, you can comfortably wear a seatbelt, and you can safely maneuver your car and apply brakes.   Increase activity slowly    Complete by:  As directed    Increase activity slowly    Complete by:  As directed    Lifting restrictions    Complete by:  As directed    You may resume regular (light) daily activities beginning the next day-such as daily self-care, walking, climbing stairs-gradually increasing activities as tolerated.   If you can walk 30 minutes without difficulty, it is safe to try more intense activity such as jogging, treadmill, bicycling, low-impact aerobics, swimming, etc. Save the most intensive and strenuous activity for last such as sit-ups, heavy lifting, contact sports, etc   Refrain from any heavy lifting or straining until you are off narcotics for pain control.   DO NOT PUSH THROUGH PAIN.   Let pain be your guide: If it hurts to do something, don't do it.   Pain is your body warning you to avoid that activity for another week until the pain goes down.   Lifting restrictions    Complete by:  As directed    You may resume regular (light) daily activities beginning the next day-such as daily self-care, walking, climbing stairs-gradually increasing activities as tolerated.   If you can walk 30 minutes without difficulty, it is safe to try more intense activity such as jogging, treadmill, bicycling, low-impact  aerobics, swimming, etc. Save the most intensive and strenuous activity for last such as sit-ups, heavy lifting, contact sports, etc   Refrain from any heavy lifting or straining until you are off narcotics for pain control.   DO NOT PUSH THROUGH PAIN.   Let pain be your guide: If it hurts to do something, don't do it.   Pain is your body warning you to avoid that activity for another week until the pain goes down.   May shower / Bathe    Complete by:  As directed    Wash / shower every day.  You may shower over the dressings as they are waterproof.  Continue to shower over incision(s) after the dressing is off.   May shower / Bathe    Complete by:  As directed    Wash / shower every day.  You may shower over the dressings as they are waterproof.  Continue to shower over incision(s) after the dressing is off.   May walk up steps    Complete by:  As directed    May walk up steps    Complete by:  As directed    Remove dressing in 72 hours    Complete by:  As directed    Remove your waterproof bandages 3-5 days after surgery.   You may leave the incision(s) open to air.   You may replace a dressing/Band-Aid to cover the incision for comfort if you wish.   Remove dressing in 72 hours    Complete by:  As directed    Remove your waterproof bandages 3-5 days after surgery.   You may leave the incision(s) open to air.   You may replace a dressing/Band-Aid to cover the incision for comfort if you wish.   Sexual Activity Restrictions    Complete by:  As directed    You may have sexual intercourse when it is comfortable. If it hurts to do something, stop.  Sexual Activity Restrictions    Complete by:  As directed    You may have sexual intercourse when it is comfortable. If it hurts to do something, stop.      Allergies as of 06/24/2016      Reactions   Valsartan-hydrochlorothiazide    Low sodium, high potassium   Codeine Nausea And Vomiting, Other (See Comments)   Not a true allergy she  needs nausea medication if given this medication.   Demerol [meperidine] Nausea And Vomiting, Other (See Comments)   Not a true allergy she needs nausea medication if given this medication.   Morphine And Related Nausea And Vomiting, Other (See Comments)   Not a true allergy she needs nausea medication if given this medication.   Nsaids Nausea And Vomiting, Other (See Comments)   Not a true allergy she needs nausea medication if given this medication.      Medication List    TAKE these medications   aspirin 81 MG EC tablet Take 1 tablet (81 mg total) by mouth daily.   atenolol 50 MG tablet Commonly known as:  TENORMIN Take 25 mg by mouth daily.   carisoprodol 350 MG tablet Commonly known as:  SOMA Take 350 mg by mouth 4 (four) times daily as needed for muscle spasms.   doxazosin 2 MG tablet Commonly known as:  CARDURA Take 1 tablet (2 mg total) by mouth daily.   gabapentin 800 MG tablet Commonly known as:  NEURONTIN Take 800 mg by mouth 3 (three) times daily.   HYDROcodone-acetaminophen 5-325 MG tablet Commonly known as:  NORCO/VICODIN Take 1-2 tablets by mouth every 4 (four) hours as needed for moderate pain or severe pain.   hyoscyamine 0.125 MG tablet Commonly known as:  LEVSIN, ANASPAZ Take 1 tablet by mouth daily.   lisinopril 10 MG tablet Commonly known as:  PRINIVIL,ZESTRIL Take 1 tablet (10 mg total) by mouth daily.   menthol-cetylpyridinium 3 MG lozenge Commonly known as:  CEPACOL Take 1 lozenge (3 mg total) by mouth as needed for sore throat.   polyethylene glycol packet Commonly known as:  MIRALAX / GLYCOLAX Take 17 g by mouth daily.   raloxifene 60 MG tablet Commonly known as:  EVISTA Take 60 mg by mouth daily.   Red Yeast Rice 600 MG Caps Take 600 mg by mouth daily.   thiamine 100 MG tablet Commonly known as:  VITAMIN B-1 Take 100 mg by mouth daily.   traMADol 50 MG tablet Commonly known as:  ULTRAM Take 1-2 tablets (50-100 mg total) by  mouth every 6 (six) hours as needed for moderate pain (joint pain). What changed:  how much to take  when to take this  reasons to take this   vitamin C 100 MG tablet Take 250 mg by mouth daily.   vitamin E 400 UNIT capsule Generic drug:  vitamin E Take 400 Units by mouth daily.       Significant Diagnostic Studies:  Results for orders placed or performed during the hospital encounter of 06/21/16 (from the past 72 hour(s))  CBC     Status: Abnormal   Collection Time: 06/23/16  8:56 AM  Result Value Ref Range   WBC 7.6 4.0 - 10.5 K/uL   RBC 3.18 (L) 3.87 - 5.11 MIL/uL   Hemoglobin 9.7 (L) 12.0 - 15.0 g/dL   HCT 29.2 (L) 36.0 - 46.0 %   MCV 91.8 78.0 - 100.0 fL   MCH 30.5 26.0 - 34.0 pg   MCHC  33.2 30.0 - 36.0 g/dL   RDW 12.9 11.5 - 15.5 %   Platelets 249 150 - 400 K/uL  Creatinine, serum     Status: None   Collection Time: 06/23/16  8:56 AM  Result Value Ref Range   Creatinine, Ser 0.62 0.44 - 1.00 mg/dL   GFR calc non Af Amer >60 >60 mL/min   GFR calc Af Amer >60 >60 mL/min    Comment: (NOTE) The eGFR has been calculated using the CKD EPI equation. This calculation has not been validated in all clinical situations. eGFR's persistently <60 mL/min signify possible Chronic Kidney Disease.     No results found.  Discharge Exam: Blood pressure (!) 145/79, pulse 98, temperature 98.5 F (36.9 C), temperature source Oral, resp. rate (!) 22, height _0  (1.499 m), weight 63 kg (139 lb), SpO2 96 %.  General: Pt awake/alert/oriented x4 in No acute distress.  Smiling, chatty Eyes: PERRL, normal EOM.  Sclera clear.  No icterus Neuro: CN II-XII intact w/o focal sensory/motor deficits. Lymph: No head/neck/groin lymphadenopathy Psych:  No delerium/psychosis/paranoia HENT: Normocephalic, Mucus membranes moist.  No thrush Neck: Supple, No tracheal deviation Chest: CBTA anteriorly. No chest wall pain w good excursion CV:  Pulses intact.  Regular rhythm MS: Normal AROM  mjr joints.  No obvious deformity Abdomen: Soft.  Nondistended.  Mildly tender at incisions only.  No evidence of peritonitis.  No incarcerated hernias. Ext:  SCDs BLE.  No mjr edema.  No cyanosis Skin: No petechiae / purpura  Past Medical History:  Diagnosis Date  . Allergic headache   . Arthritis    "all over"  . Chronic back pain    "top of my neck to bottom of my feet" (08/25/2015)  . Diverticulitis   . H/O drainage of abscess    left buttocks area -closed drainage bag in place 10-12-15   . History of blood transfusion    58 years ago   . Hypertension   . Impaired mobility    "cervical neck- range of motion limitations"  . Scoliosis   . TIA (transient ischemic attack) 06/03/2016   admitted with symtoms of tia all tests normal all symptoms resolved    Past Surgical History:  Procedure Laterality Date  . APPENDECTOMY  1960s  . COLON SURGERY  10/18/2015  . CT GUIDE ABSCESS DRAINAGE  ([place] HX)  08/25/2015   pelvic diverticular abscess  . DILATION AND CURETTAGE OF UTERUS    . IR GENERIC HISTORICAL  09/20/2015   IR RADIOLOGIST EVAL & MGMT 09/20/2015 Monia Sabal, PA-C [facility] INTERV RAD  . IR GENERIC HISTORICAL  10/03/2015   IR RADIOLOGIST EVAL & MGMT 10/03/2015 [facility] INTERV RAD  . JOINT REPLACEMENT  2002   Dr Gladstone Lighter  . TONSILLECTOMY    . TOTAL KNEE ARTHROPLASTY Right   . VAGINAL HYSTERECTOMY  1973   "partial"    Social History   Social History  . Marital status: Widowed    Spouse name: N/A  . Number of children: N/A  . Years of education: N/A   Occupational History  . Not on file.   Social History Main Topics  . Smoking status: Never Smoker  . Smokeless tobacco: Never Used  . Alcohol use No  . Drug use: No  . Sexual activity: No   Other Topics Concern  . Not on file   Social History Narrative   Lives with daughter in [place], [place] who has been staying with her since mid 2017. Both originally from [place],  [place], moved in 2014-2015.    History reviewed. No  pertinent family history.  Current Facility-Administered Medications  Medication Dose Route Frequency Provider Last Rate Last Dose  . 0.9 %  sodium chloride infusion  250 mL Intravenous PRN Michael Boston, MD      . 0.9 %  sodium chloride infusion  250 mL Intravenous PRN Michael Boston, MD      . acetaminophen (TYLENOL) tablet 650 mg  650 mg Oral Q4H PRN Michael Boston, MD   650 mg at 06/22/16 1434   Or  . acetaminophen (TYLENOL) suppository 650 mg  650 mg Rectal Q4H PRN Michael Boston, MD      . albuterol (PROVENTIL) (2.5 MG/3ML) 0.083% nebulizer solution 2.5 mg  2.5 mg Nebulization Q6H PRN Michael Boston, MD      . ALPRAZolam Duanne Moron) tablet 0.25 mg  0.25 mg Oral QHS PRN Johnathan Hausen, MD   0.25 mg at 06/23/16 2210  . alum & mag hydroxide-simeth (MAALOX/MYLANTA) 200-200-20 MG/5ML suspension 30 mL  30 mL Oral Q6H PRN Michael Boston, MD      . aspirin EC tablet 81 mg  81 mg Oral Daily Michael Boston, MD   81 mg at 06/23/16 1007  . atenolol (TENORMIN) tablet 25 mg  25 mg Oral Daily Michael Boston, MD   25 mg at 06/23/16 1008  . bisacodyl (DULCOLAX) suppository 10 mg  10 mg Rectal Q12H PRN Michael Boston, MD      . carisoprodol (SOMA) tablet 350 mg  350 mg Oral QID PRN Michael Boston, MD   350 mg at 06/23/16 2210  . diphenhydrAMINE (BENADRYL) capsule 25 mg  25 mg Oral Q6H PRN Michael Boston, MD   25 mg at 06/21/16 2357  . diphenhydrAMINE (BENADRYL) injection 12.5-25 mg  12.5-25 mg Intravenous Q6H PRN Michael Boston, MD      . doxazosin (CARDURA) tablet 2 mg  2 mg Oral Daily Michael Boston, MD   2 mg at 06/23/16 1021  . enalaprilat (VASOTEC) injection 0.625-1.25 mg  0.625-1.25 mg Intravenous Q6H PRN Michael Boston, MD      . fentaNYL (SUBLIMAZE) injection 25-50 mcg  25-50 mcg Intravenous Q2H PRN Michael Boston, MD   50 mcg at 06/24/16 7893  . gabapentin (NEURONTIN) capsule 800 mg  800 mg Oral TID Michael Boston, MD   800 mg at 06/23/16 2136  . heparin injection 5,000 Units  5,000 Units Subcutaneous Q8H Johnathan Hausen, MD   5,000  Units at 06/24/16 8101  . hydrALAZINE (APRESOLINE) injection 5-20 mg  5-20 mg Intravenous Q6H PRN Michael Boston, MD      . hyoscyamine Burnard Leigh, ANASPAZ) tablet 0.125 mg  0.125 mg Oral Daily Michael Boston, MD   0.125 mg at 06/23/16 1009  . lip balm (CARMEX) ointment 1 application  1 application Topical BID Michael Boston, MD   1 application at 75/10/25 1010  . lisinopril (PRINIVIL,ZESTRIL) tablet 10 mg  10 mg Oral Daily Michael Boston, MD   10 mg at 06/23/16 1008  . magic mouthwash  15 mL Oral QID PRN Michael Boston, MD      . menthol-cetylpyridinium (CEPACOL) lozenge 3 mg  1 lozenge Oral PRN Michael Boston, MD      . metoprolol (LOPRESSOR) injection 5 mg  5 mg Intravenous Q6H PRN Michael Boston, MD      . oxyCODONE (Oxy IR/ROXICODONE) immediate release tablet 5-10 mg  5-10 mg Oral Q4H PRN Michael Boston, MD      . phenol [facility]) mouth spray 2 spray  2 spray Mouth/Throat PRN Michael Boston, MD      . polyethylene glycol (MIRALAX / GLYCOLAX) packet 17 g  17 g Oral BID Michael Boston, MD   17 g at 06/23/16 0955  . polyethylene glycol (MIRALAX / GLYCOLAX) packet 17 g  17 g Oral Q12H PRN Michael Boston, MD      . prochlorperazine (COMPAZINE) injection 5-10 mg  5-10 mg Intravenous Q4H PRN Michael Boston, MD   10 mg at 06/21/16 1315  . raloxifene (EVISTA) tablet 60 mg  60 mg Oral Daily Michael Boston, MD   60 mg at 06/23/16 1008  . sodium chloride flush (NS) 0.9 % injection 3 mL  3 mL Intravenous Q12H Michael Boston, MD   Stopped at 06/22/16 2300  . sodium chloride flush (NS) 0.9 % injection 3 mL  3 mL Intravenous PRN Michael Boston, MD      . sodium chloride flush (NS) 0.9 % injection 3 mL  3 mL Intravenous Q12H Michael Boston, MD   3 mL at 06/23/16 2137  . sodium chloride flush (NS) 0.9 % injection 3 mL  3 mL Intravenous PRN Michael Boston, MD      . thiamine (VITAMIN B-1) tablet 100 mg  100 mg Oral Daily Michael Boston, MD   100 mg at 06/23/16 1007  . vitamin C (ASCORBIC ACID) tablet 250 mg  250 mg Oral Daily Michael Boston, MD    250 mg at 06/23/16 1007  . vitamin E capsule 400 Units  400 Units Oral Daily Michael Boston, MD   400 Units at 06/23/16 1008     Allergies  Allergen Reactions  . Valsartan-Hydrochlorothiazide     Low sodium, high potassium  . Codeine Nausea And Vomiting and Other (See Comments)    Not a true allergy she needs nausea medication if given this medication.  . Demerol [Meperidine] Nausea And Vomiting and Other (See Comments)    Not a true allergy she needs nausea medication if given this medication.   . Morphine And Related Nausea And Vomiting and Other (See Comments)    Not a true allergy she needs nausea medication if given this medication.   . Nsaids Nausea And Vomiting and Other (See Comments)    Not a true allergy she needs nausea medication if given this medication.     Signed: Edward Qualia, PA-S [facility]  _0 @  06/24/2016, 8:12 AM

## 2016-07-04 ENCOUNTER — Encounter (HOSPITAL_COMMUNITY): Payer: Self-pay | Admitting: Surgery

## 2017-01-09 NOTE — Addendum Note (Signed)
Addendum  created 01/09/17 1037 by Roberts Gaudy, MD   Sign clinical note

## 2017-07-14 HISTORY — PX: EYE SURGERY: SHX253

## 2018-01-13 IMAGING — CT CT ABD-PELV W/ CM
2 of 5 series · 16 of 46 positions shown, 18 images · IV contrast (Omni 300)
Comparison: 07/16/2015

CLINICAL DATA: Left lower quadrant abdominal pain

EXAM:
CT ABDOMEN AND PELVIS WITH CONTRAST
TECHNIQUE: Multidetector CT imaging of the abdomen and pelvis was performed
using the standard protocol following bolus administration of
intravenous contrast.
CONTRAST:  1 BO3T09-CYY IOPAMIDOL (BO3T09-CYY) INJECTION 61%

[Series 2: a/p w/ 5mm · axial · 0.63mm/px · z∈[+802,+1167]mm · 13 of 83 slices shown, 15 images]
[im 5/83  soft-tissue]
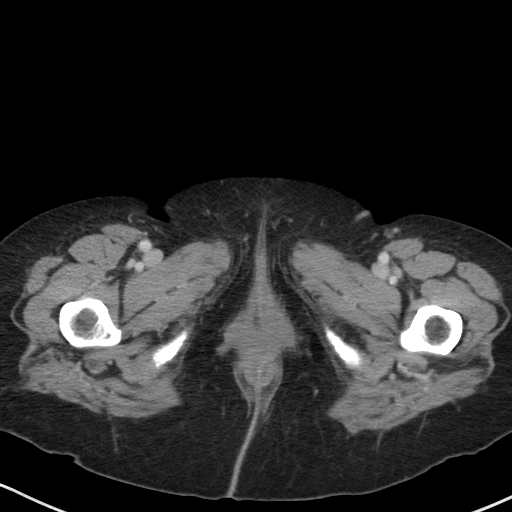
[im 5/83  bone]
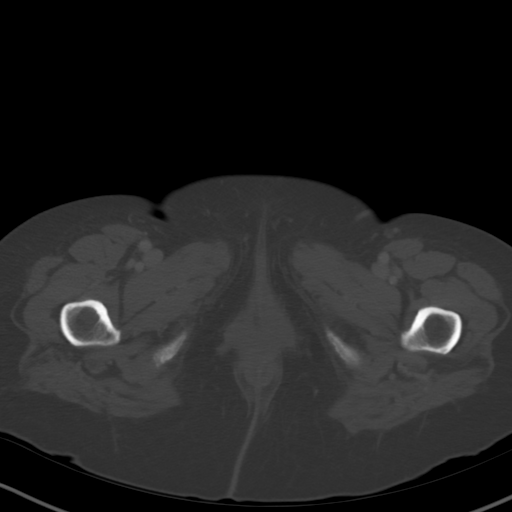
[im 10/83  soft-tissue]
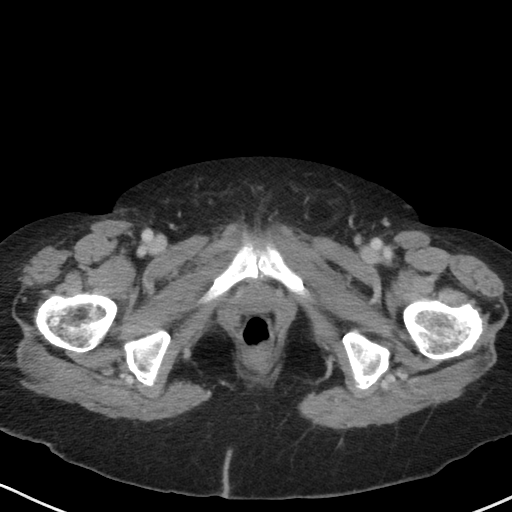
[im 19/83  soft-tissue]
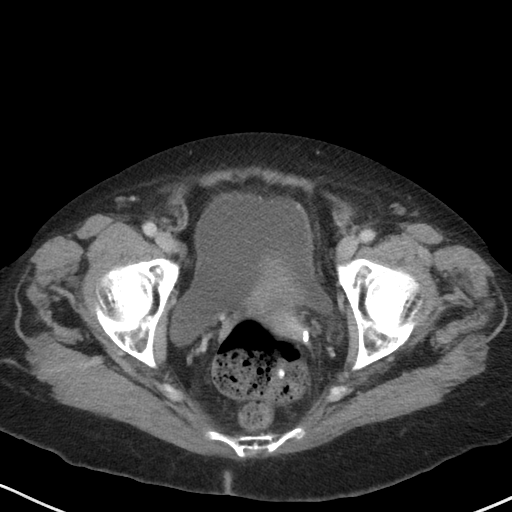
[im 23/83  soft-tissue]
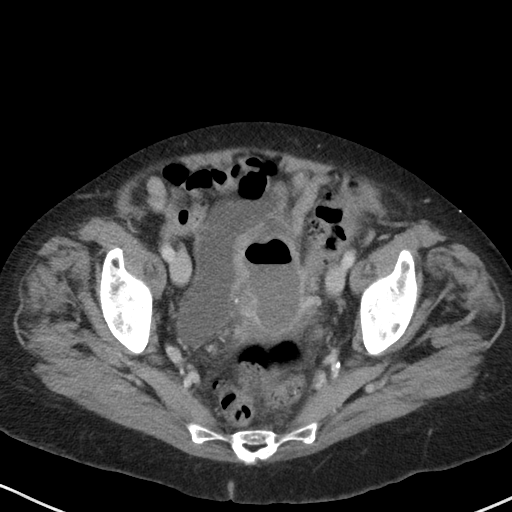
[im 28/83  soft-tissue]
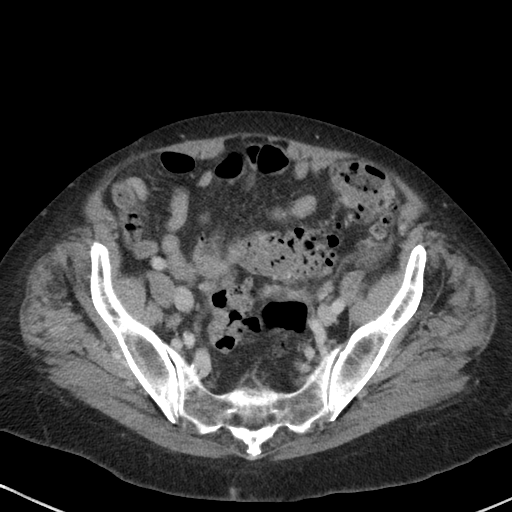
[im 37/83  soft-tissue]
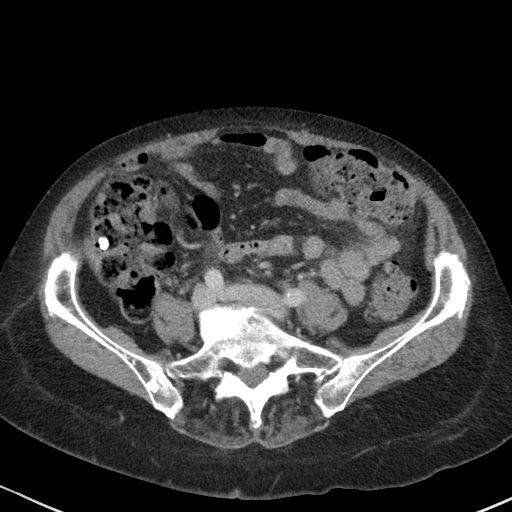
[im 42/83  soft-tissue]
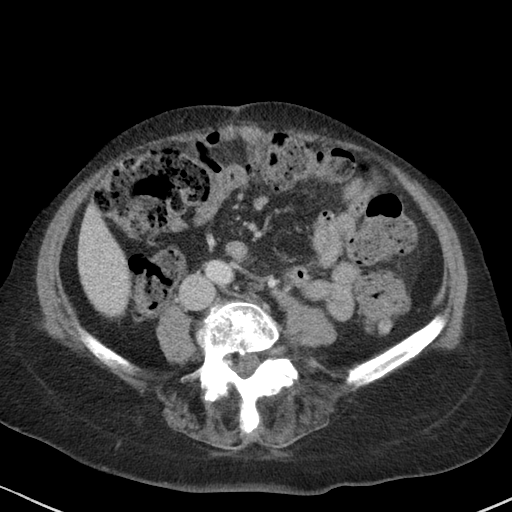
[im 46/83  soft-tissue]
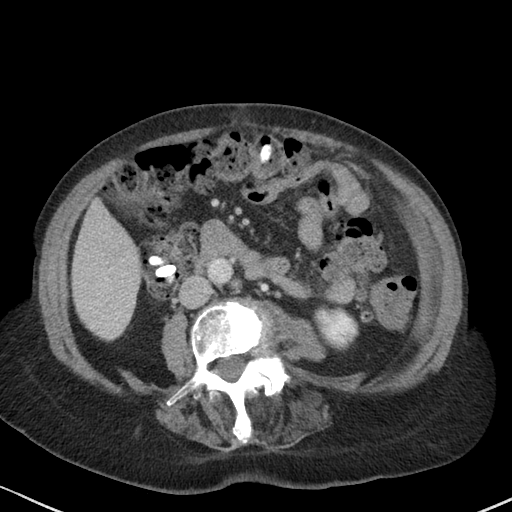
[im 55/83  soft-tissue]
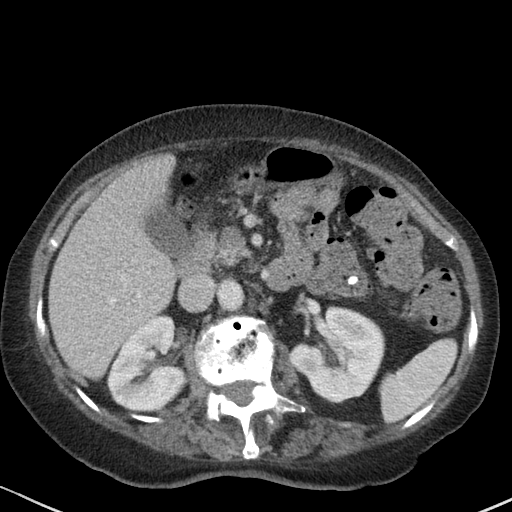
[im 55/83  bone]
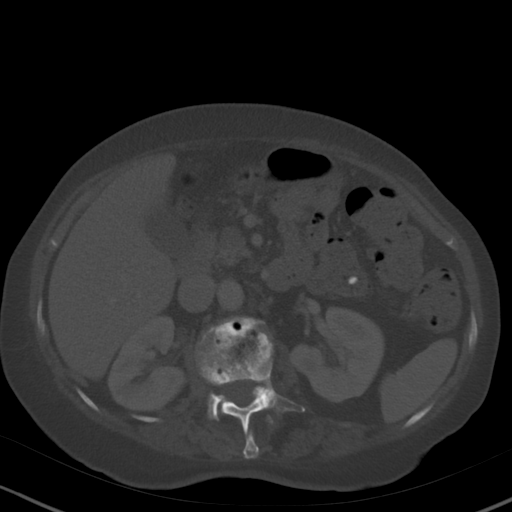
[im 60/83  soft-tissue]
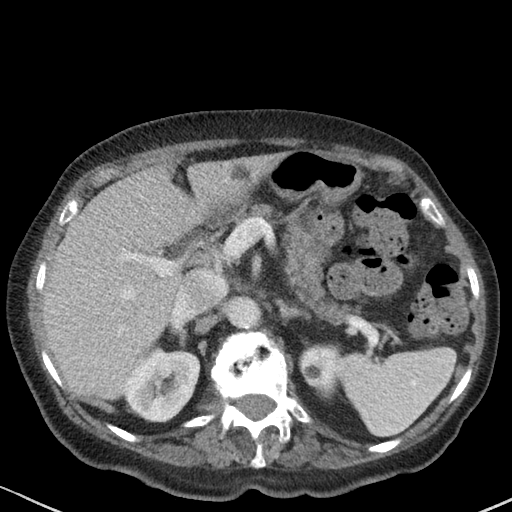
[im 64/83  soft-tissue]
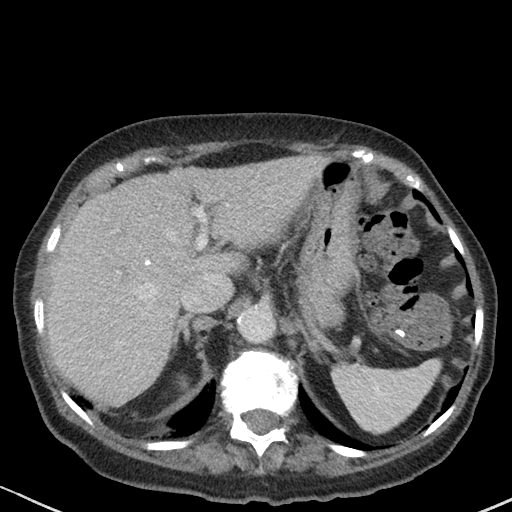
[im 73/83  soft-tissue]
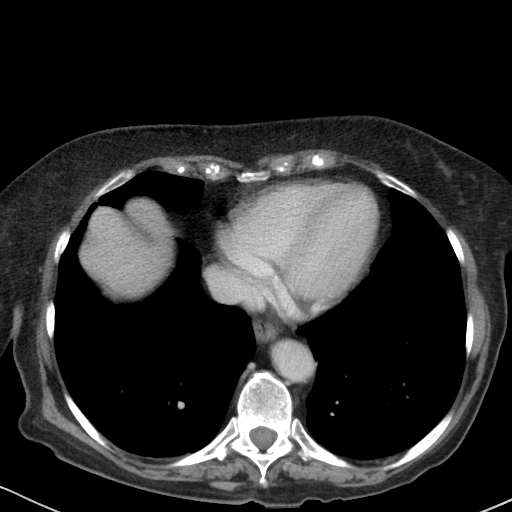
[im 78/83  soft-tissue]
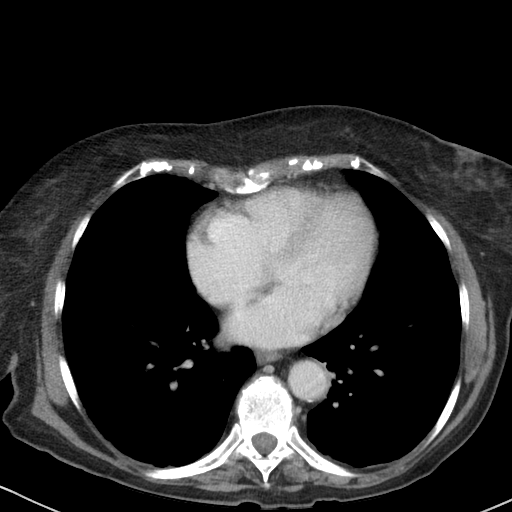

[Series 5: a/p w/ cor · coronal · 0.83mm/px · 3 of 143 slices shown]
[im 48/143  soft-tissue]
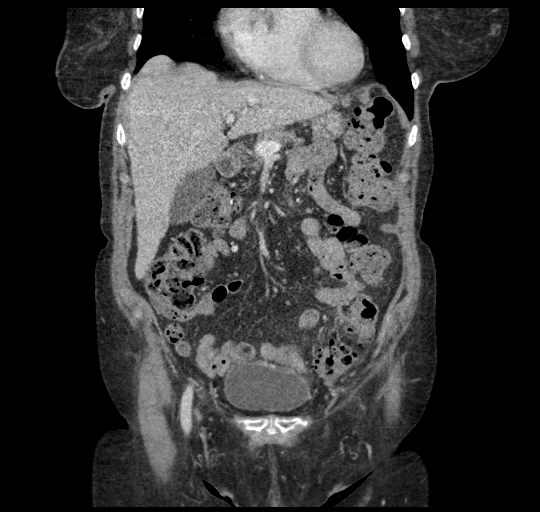
[im 64/143  soft-tissue]
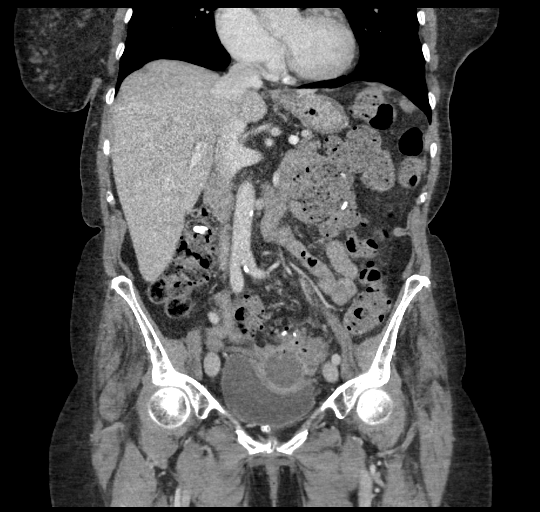
[im 79/143  soft-tissue]
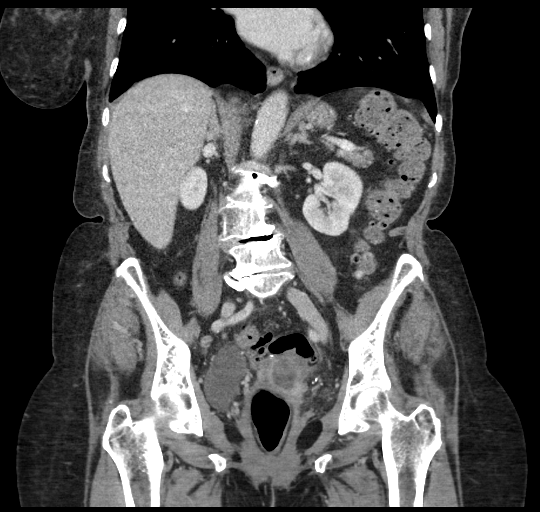

[16 of 46 positions shown; findings below may reference images not displayed]

FINDINGS: Lower chest and abdominal wall: Fatty Bochdalek's hernia on the
right

Hepatobiliary: Cystic density segment 2 lesion measuring 13 mm.
Coarse parenchymal calcifications considered incidental.No evidence
of biliary obstruction or stone.

Pancreas: Ovoid low-density at the pancreatic neck measuring 21 mm
in axial dimension, favor cystic pancreatic mass over focal
dilatation the common bile duct given the normal diameter distally
the pancreatic head. No malignant type enhancement or nodule.

Spleen: Unremarkable.

Adrenals/Urinary Tract: Negative adrenals. No hydronephrosis or
stone. 1 cm upper pole cyst on the left. Right renal cortical
scarring and thinning. Distorted urinary bladder via abscess. No
pneumaturia or bladder wall thickening to suggest fistula.

Reproductive:Hysterectomy.

Stomach/Bowel: Inflammatory changes around the sigmoid colon have
improved, but there is a new 60 x 33 x 35 mm thick walled gas and
fluid collection interposed between the sigmoid colon and bladder.
Given previous findings and extensive diverticulosis, this is likely
diverticular abscess. Large stool volume on a chronic basis. No
pericecal inflammation.

Vascular/Lymphatic: No acute vascular abnormality. No mass or
adenopathy.

Peritoneal: No ascites or pneumoperitoneum.

Musculoskeletal: No acute abnormalities. Advanced disc and facet
degeneration.
IMPRESSION: 1. 6 cm abscess between the sigmoid colon and bladder, presumably
diverticular given recent CT findings.
2. 21 mm cystic pancreatic head mass which could be re-evaluated in
1 year by MRI or CT. Additional chronic findings are described above

## 2018-01-14 IMAGING — CT CT IMAGE GUIDED DRAINAGE BY PERCUTANEOUS CATHETER
1 series · 14 of 32 positions shown, 18 images · non-contrast
Comparison: none

INDICATION: 83-year-old with diverticular pelvic abscess.
TECHNIQUE: Informed written consent was obtained from the patient after a
thorough discussion of the procedural risks, benefits and
alternatives. All questions were addressed. Maximal Sterile Barrier
Technique was utilized including caps, mask, sterile gowns, sterile
gloves, sterile drape, hand hygiene and skin antiseptic. A timeout
was performed prior to the initiation of the procedure.

[Series 3: i-spiral 5.0 b40f · axial · 0.88mm/px · z∈[-161,-11]mm · 14 of 49 slices shown, 18 images]
[im 4/49  soft-tissue]
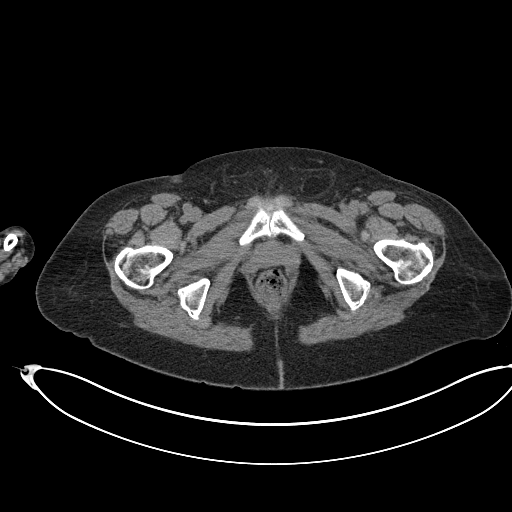
[im 4/49  bone]
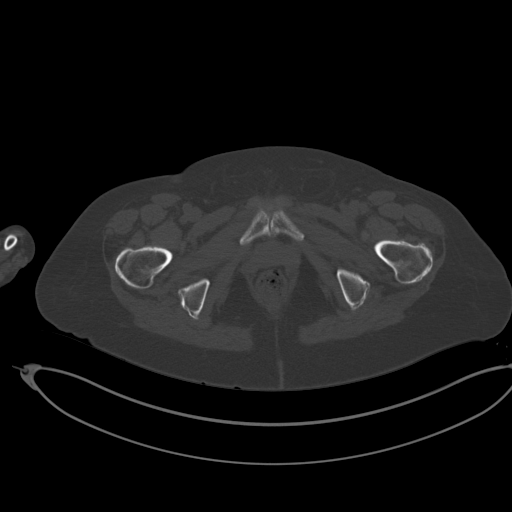
[im 7/49  soft-tissue]
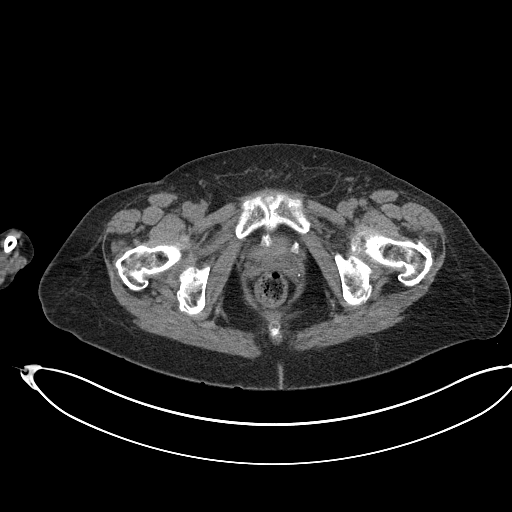
[im 11/49  soft-tissue]
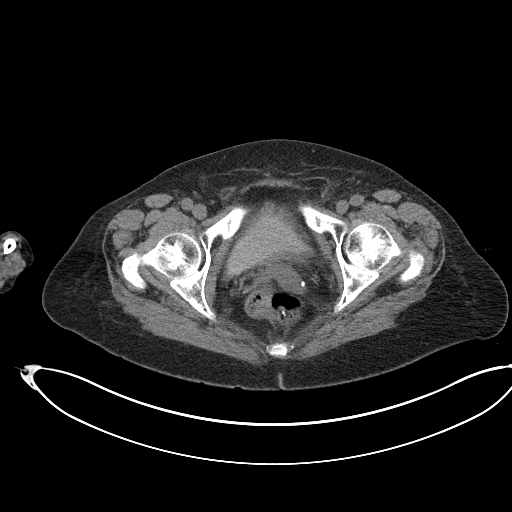
[im 14/49  soft-tissue]
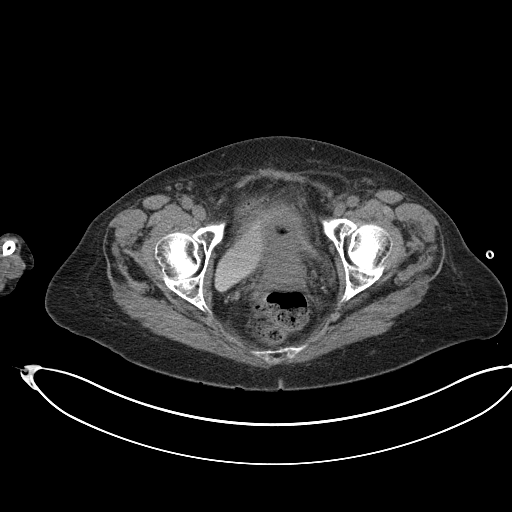
[im 19/49  soft-tissue]
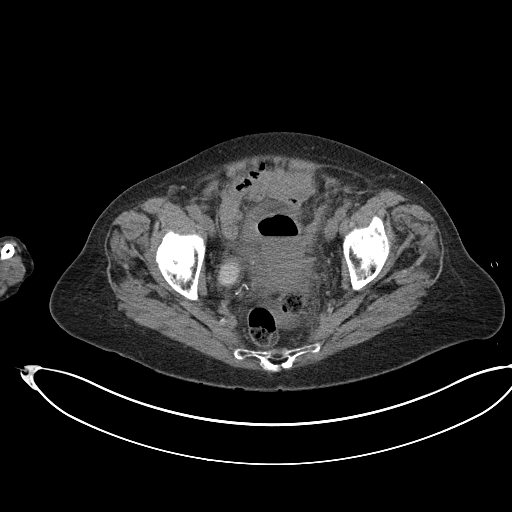
[im 22/49  soft-tissue]
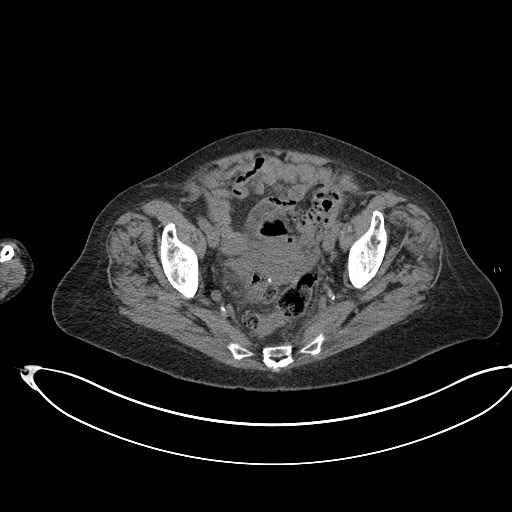
[im 27/49  soft-tissue]
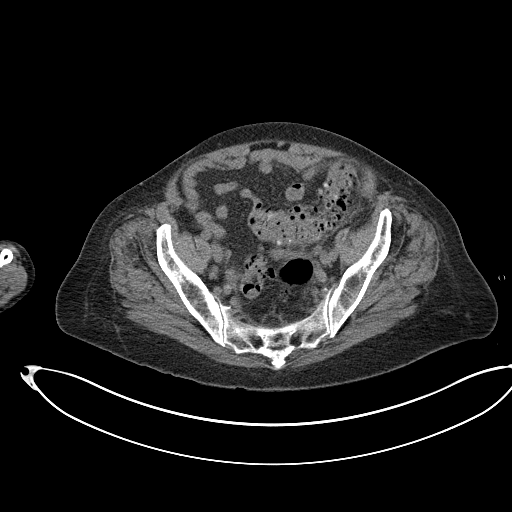
[im 30/49  soft-tissue]
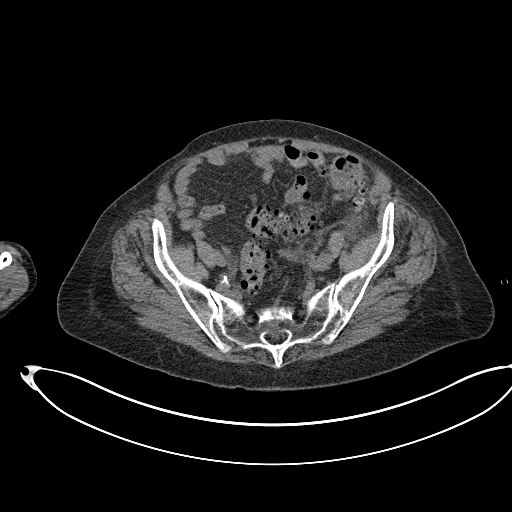
[im 35/49  soft-tissue]
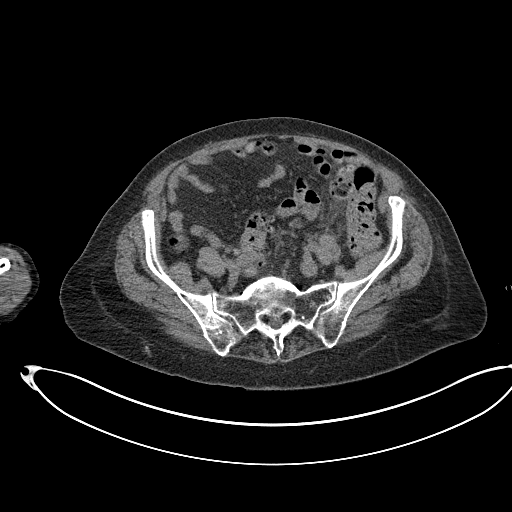
[im 35/49  bone]
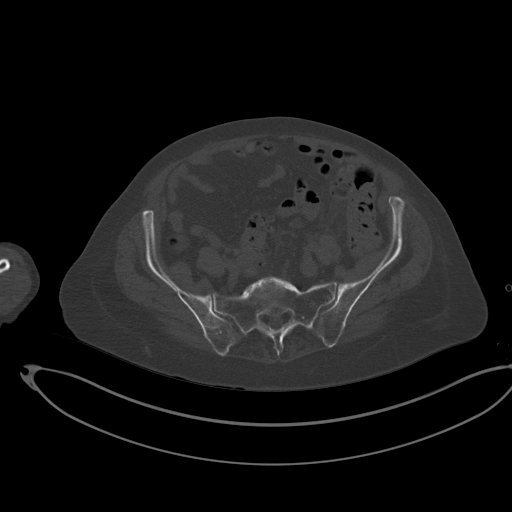
[im 38/49  soft-tissue]
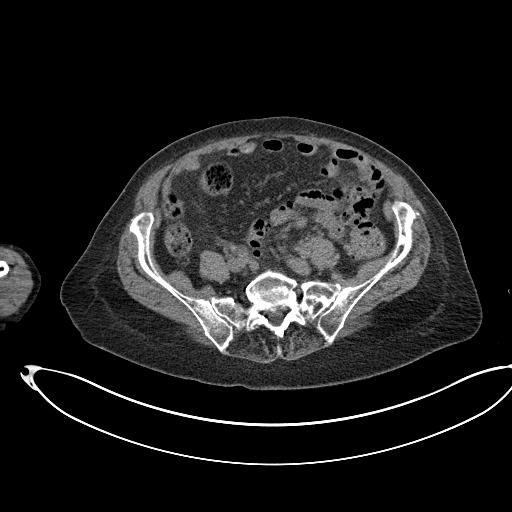
[im 42/49  soft-tissue]
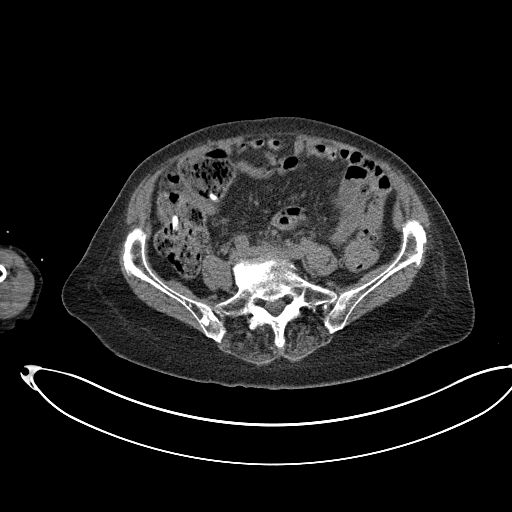
[im 42/49  lung]
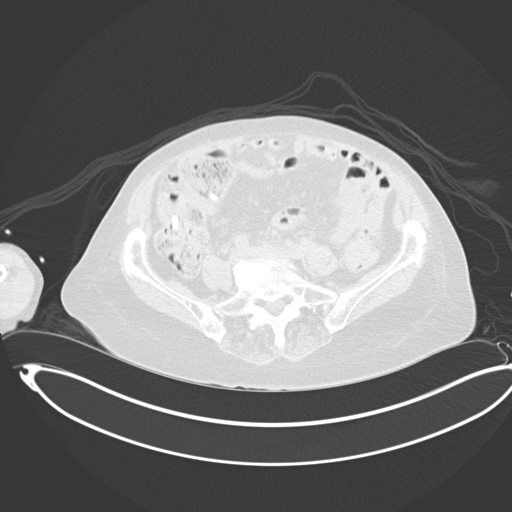
[im 44/49  lung]
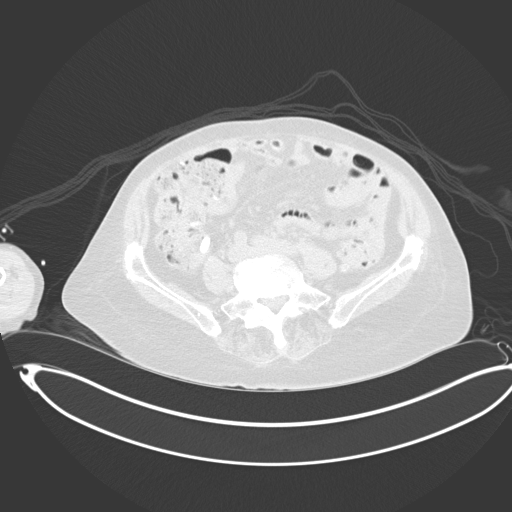
[im 45/49  soft-tissue]
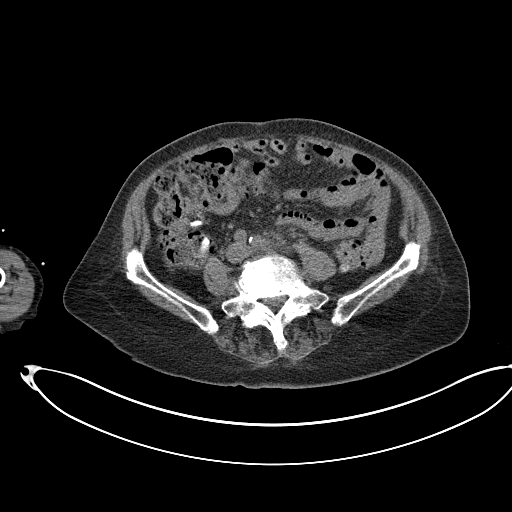
[im 45/49  lung]
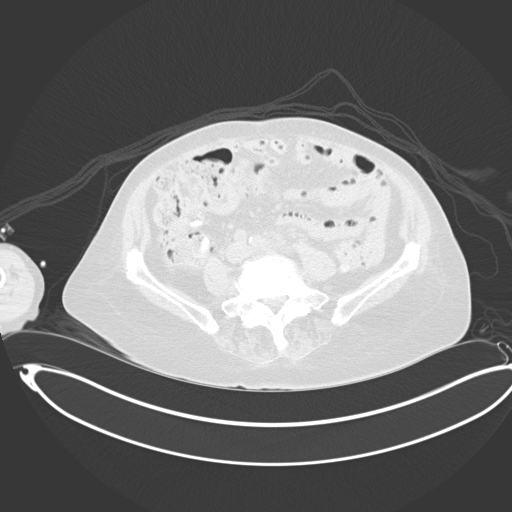
[im 47/49  lung]
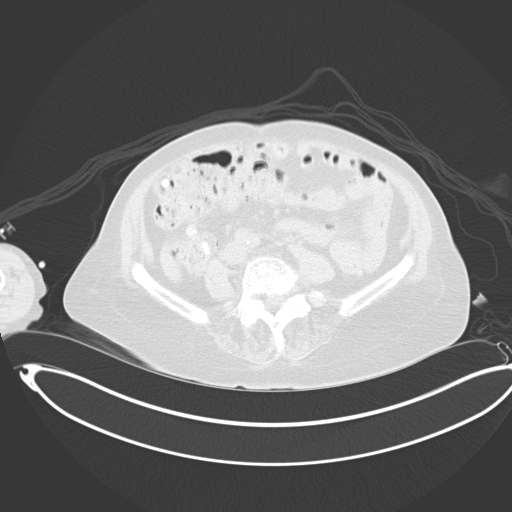

[14 of 32 positions shown; findings below may reference images not displayed]

EXAM:
CT GUIDED DRAINAGE OF PELVIC ABSCESS

MEDICATIONS:
The patient is currently admitted to the hospital and receiving
intravenous antibiotics. The antibiotics were administered within an
appropriate time frame prior to the initiation of the procedure.

ANESTHESIA/SEDATION:
2.0 mg IV Versed 100 mcg IV Fentanyl

Moderate Sedation Time:  44 minutes

The patient was continuously monitored during the procedure by the
interventional radiology nurse under my direct supervision.

COMPLICATIONS:
None immediate.
PROCEDURE:
Patient was initially placed in supine position and CT images
through the pelvis were obtained. A safe percutaneous window was not
identified for drainage of the abscess from an anterior approach.
Subsequently, patient was placed prone and repeat images through the
pelvis were obtained. A percutaneous window from the left
transgluteal approach was selected. The left side of the back was
prepped with chlorhexidine and a sterile field was created. Skin was
anesthetized with 1% lidocaine. Using CT guidance, an 18 gauge
trocar needle was directed into the air-fluid collection just above
the vaginal cuff. Needle was advanced into the air-fluid collection.
Fluid was unable to be aspirated from this needle. A wire was
advanced to confirm placement within the abscess. The tract was
dilated to accommodate a 10.2 French multipurpose drain. 20 mL of
[place] colored thick fluid was removed. Follow up CT images were
obtained. Catheter was sutured to the skin and a bandage was placed.
Catheter was attached to a suction bulb.
FINDINGS: Air-fluid collection anterior to the rectum and just above the
vaginal cuff. Catheter was successfully placed within this
collection from a left transgluteal approach. Decompression of the
abscess after catheter placement. 20 mL of thick gray colored fluid
was removed. Fluid was sent for culture.
IMPRESSION: Successful CT-guided drain placement in the pelvic abscess
collection.

## 2018-01-26 IMAGING — RF DG SINUS / FISTULA TRACT / ABSCESSOGRAM
10 of 11 series · 19 of 23 positions shown · non-contrast
Comparison: none

CLINICAL DATA: Diverticulitis of large intestine with abscess
without bleeding/intra abdominal abscess. Percutaneous drain
catheter placed in diverticular abscess 08/25/2015. Minimal output.

EXAM:
ABSCESS DRAIN CATHETER INJECTION UNDER FLUOROSCOPY
TECHNIQUE: The procedure, risks (including but not limited to bleeding,
infection, organ damage ), benefits, and alternatives were explained
to the patient. Questions regarding the procedure were encouraged
and answered. The patient understands and consents to the procedure.

[Series 1: run · 1 of 1 slices shown (1 of 10)]
[im 1/1]
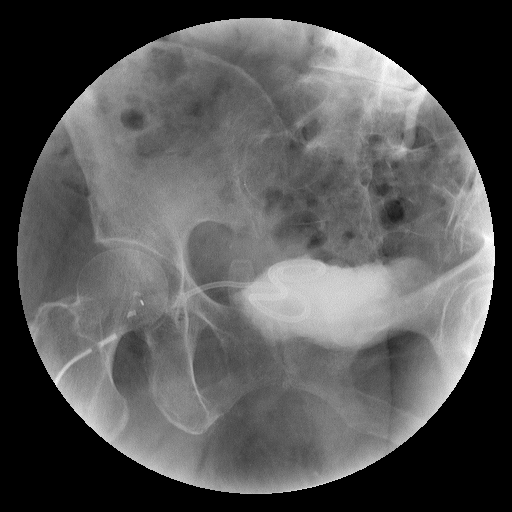

[Series 2: run · 1 of 1 slices shown (2 of 10)]
[im 1/1]
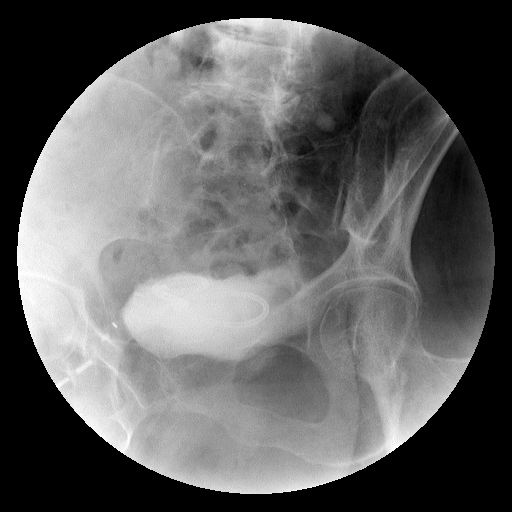

[Series 4: run · 1 of 1 slices shown (3 of 10)]
[im 1/1]
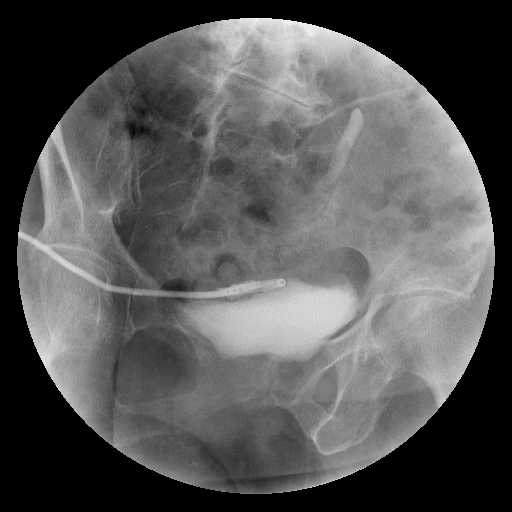

[Series 5: run · 1 of 1 slices shown (4 of 10)]
[im 1/1]
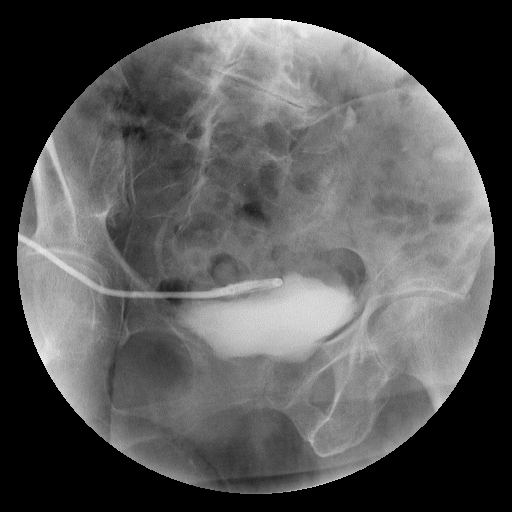

[Series 6: run · 1 of 1 slices shown (5 of 10)]
[im 1/1]
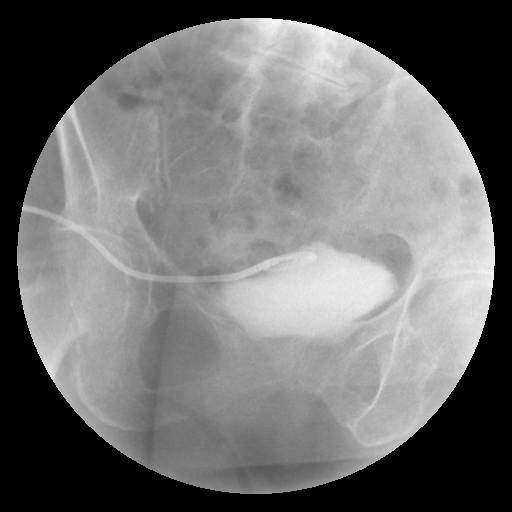

[Series 7: run · 4 of 5 slices shown (6 of 10)]
[im 1/5]
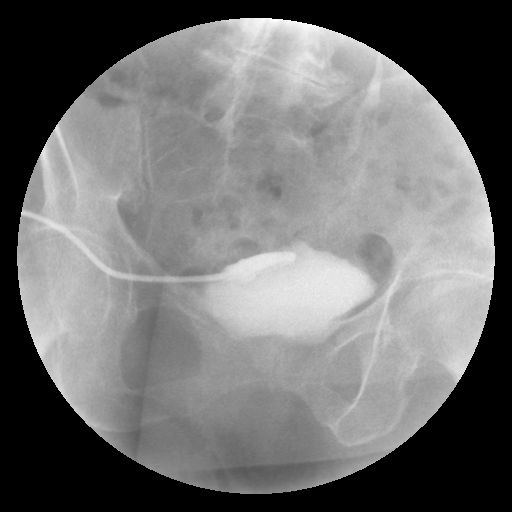
[im 2/5]
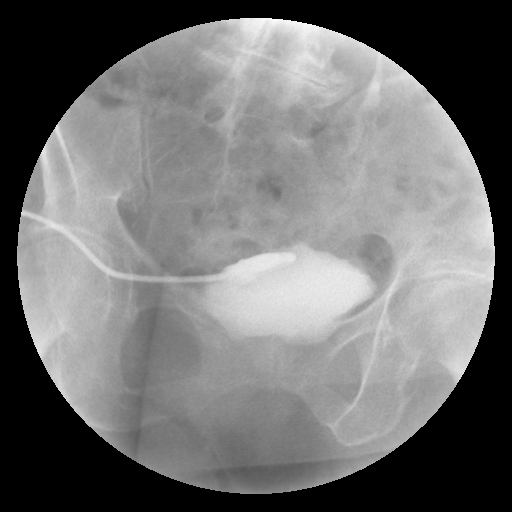
[im 4/5]
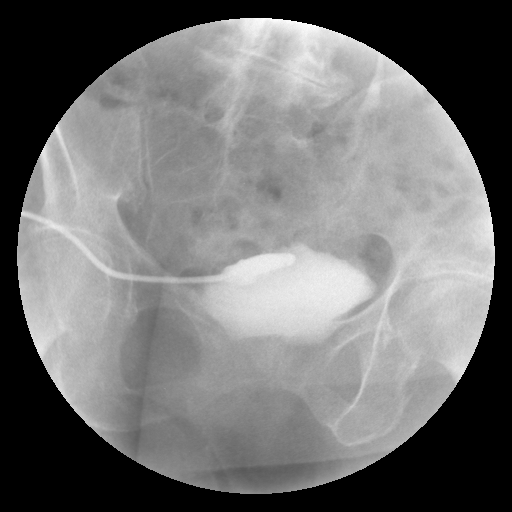
[im 5/5]
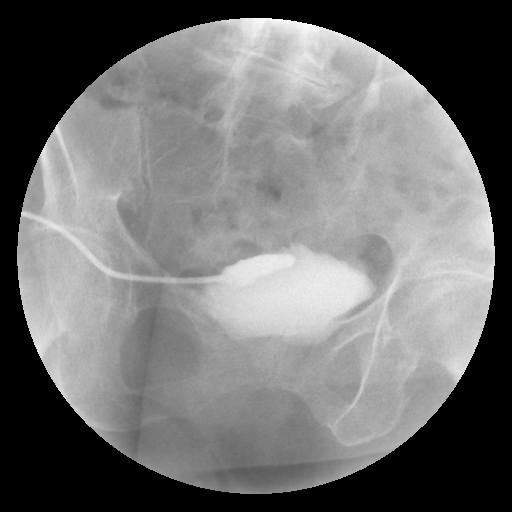

[Series 8: run · 1 of 1 slices shown (7 of 10)]
[im 1/1]
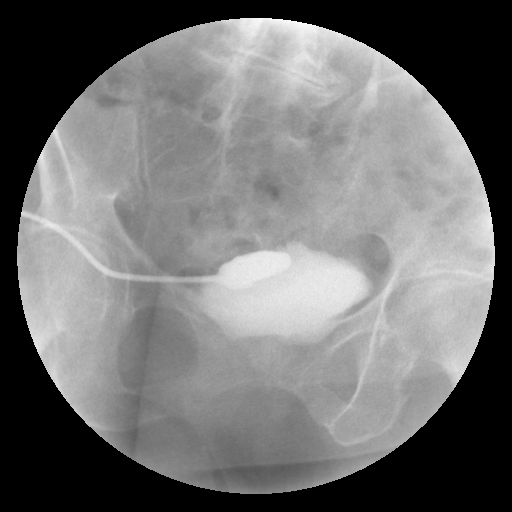

[Series 9: run · 4 of 5 slices shown (8 of 10)]
[im 1/5]
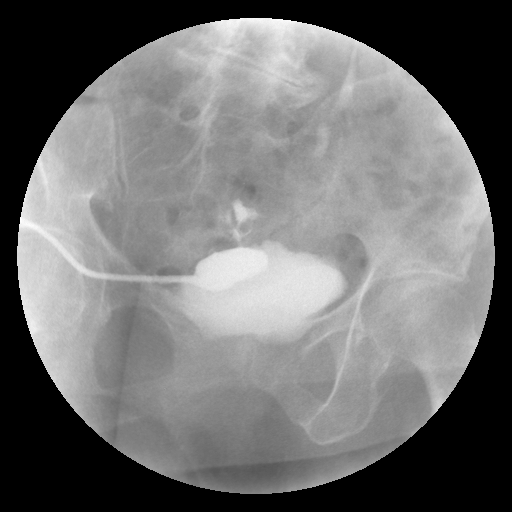
[im 2/5]
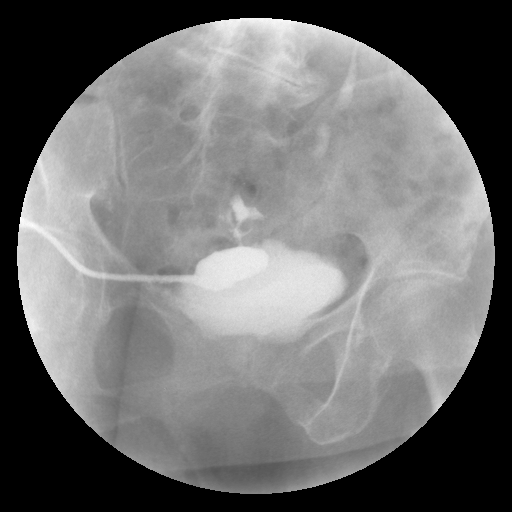
[im 4/5]
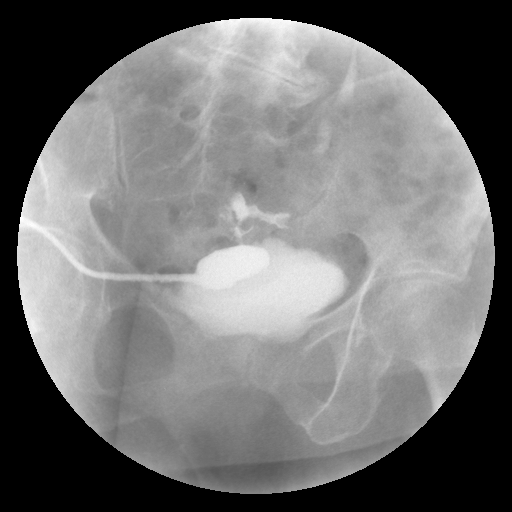
[im 5/5]
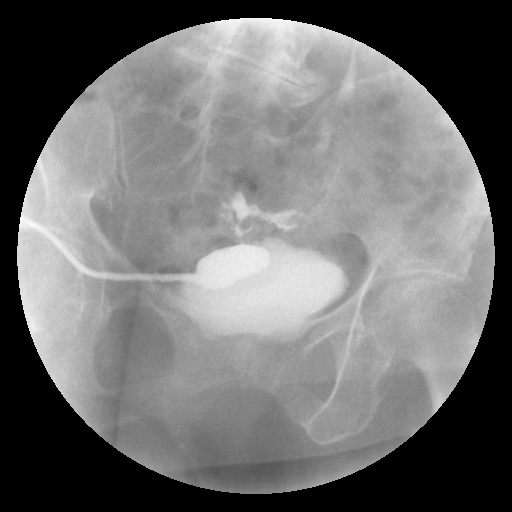

[Series 10: run · 4 of 5 slices shown (9 of 10)]
[im 1/5]
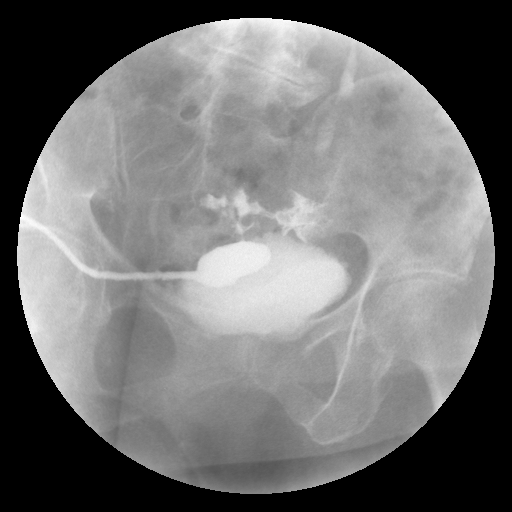
[im 2/5]
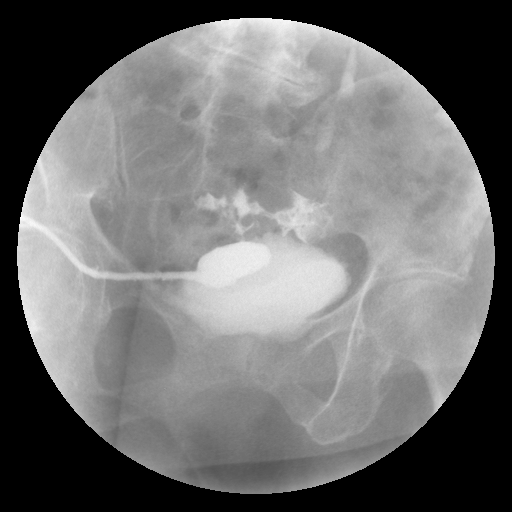
[im 3/5]
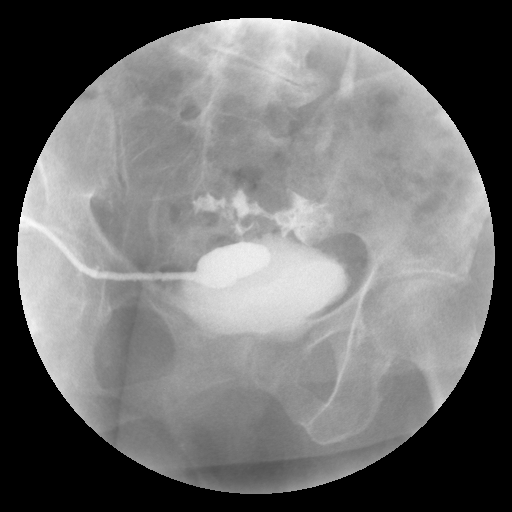
[im 5/5]
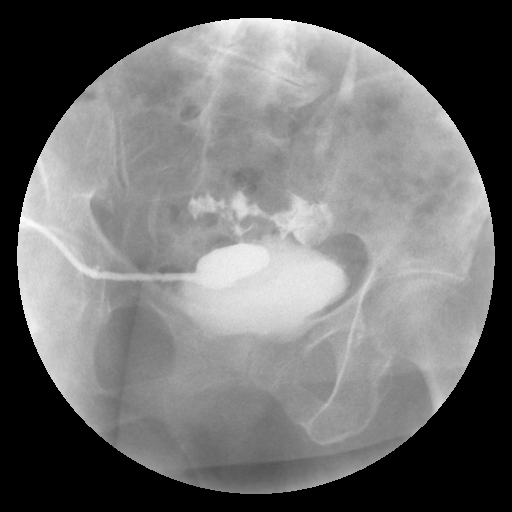

[Series 11: run · 1 of 1 slices shown (10 of 10)]
[im 1/1]
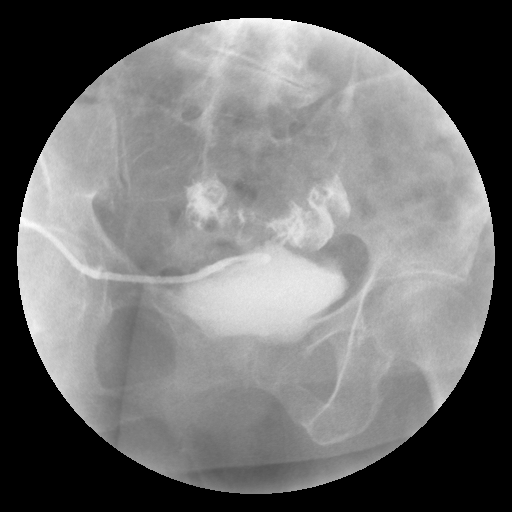

[19 of 23 positions shown; findings below may reference images not displayed]

Survey fluoroscopic inspection reveals stable position of the
pigtail drain catheter in the left pelvis..

Injection initially demonstrated occlusion of the drain catheter.
This was cleared with gentle aspiration of sterile saline using a 3
mL syringe. Contrast injection under fluoroscopy demonstrated no
significant residual abscess cavity. However, there was
opacification of a patent fistula to the mid sigmoid colon.
IMPRESSION: 1. Interval resolution of diverticular abscess, with persistent
fistula to mid sigmoid colon.

## 2018-04-10 ENCOUNTER — Emergency Department (HOSPITAL_COMMUNITY): Payer: Medicare Other

## 2018-04-10 ENCOUNTER — Encounter (HOSPITAL_COMMUNITY): Payer: Self-pay | Admitting: Emergency Medicine

## 2018-04-10 ENCOUNTER — Other Ambulatory Visit: Payer: Self-pay

## 2018-04-10 ENCOUNTER — Emergency Department (HOSPITAL_COMMUNITY)
Admission: EM | Admit: 2018-04-10 | Discharge: 2018-04-10 | Disposition: A | Discharge status: Home / Self Care | Payer: Medicare Other | Attending: Emergency Medicine | Admitting: Emergency Medicine

## 2018-04-10 DIAGNOSIS — I10 Essential (primary) hypertension: Secondary | ICD-10-CM | POA: Diagnosis not present

## 2018-04-10 DIAGNOSIS — Z79899 Other long term (current) drug therapy: Secondary | ICD-10-CM | POA: Diagnosis not present

## 2018-04-10 DIAGNOSIS — Z7982 Long term (current) use of aspirin: Secondary | ICD-10-CM | POA: Diagnosis not present

## 2018-04-10 DIAGNOSIS — I493 Ventricular premature depolarization: Secondary | ICD-10-CM | POA: Diagnosis not present

## 2018-04-10 DIAGNOSIS — Z96651 Presence of right artificial knee joint: Secondary | ICD-10-CM | POA: Diagnosis not present

## 2018-04-10 DIAGNOSIS — R0602 Shortness of breath: Secondary | ICD-10-CM | POA: Diagnosis present

## 2018-04-10 LAB — CBC
HEMATOCRIT: 40.2 % (ref 36.0–46.0)
HEMOGLOBIN: 12.4 g/dL (ref 12.0–15.0)
MCH: 30.3 pg (ref 26.0–34.0)
MCHC: 30.8 g/dL (ref 30.0–36.0)
MCV: 98.3 fL (ref 80.0–100.0)
Platelets: 222 10*3/uL (ref 150–400)
RBC: 4.09 MIL/uL (ref 3.87–5.11)
RDW: 14.7 % (ref 11.5–15.5)
WBC: 6 10*3/uL (ref 4.0–10.5)
nRBC: 0 % (ref 0.0–0.2)

## 2018-04-10 LAB — COMPREHENSIVE METABOLIC PANEL
ALK PHOS: 59 U/L (ref 38–126)
ALT: 16 U/L (ref 0–44)
ANION GAP: 10 (ref 5–15)
AST: 28 U/L (ref 15–41)
Albumin: 3.8 g/dL (ref 3.5–5.0)
BILIRUBIN TOTAL: 0.5 mg/dL (ref 0.3–1.2)
BUN: 26 mg/dL — ABNORMAL HIGH (ref 8–23)
CALCIUM: 9.5 mg/dL (ref 8.9–10.3)
CO2: 24 mmol/L (ref 22–32)
CREATININE: 0.77 mg/dL (ref 0.44–1.00)
Chloride: 101 mmol/L (ref 98–111)
Glucose, Bld: 113 mg/dL — ABNORMAL HIGH (ref 70–99)
Potassium: 4.2 mmol/L (ref 3.5–5.1)
Sodium: 135 mmol/L (ref 135–145)
TOTAL PROTEIN: 6.6 g/dL (ref 6.5–8.1)

## 2018-04-10 LAB — I-STAT TROPONIN, ED: TROPONIN I, POC: 0 ng/mL (ref 0.00–0.08)

## 2018-04-10 NOTE — Discharge Instructions (Addendum)
Continue taking home medications as prescribed.  If your blood pressure becomes elevated (over 180/120) and you are having chest discomfort or feeling like your heart is racing, you may take another atenolol.  Follow up with your primary care doctor and/or the cardiologist for further management of your blood pressure.  Return to the ER if you develop chest pain, difficulty breathing, numbness/tingling, or with any new, worsening, or concerning symptoms.

## 2018-04-10 NOTE — ED Triage Notes (Signed)
Pt arrived [facility] from her primary care doctors office with c/o sob chest pain that started this afternoon pt was given 0.1 clonidine by Dr. Gabriel Carina for HTN initially 196/106 Last BP 126/74 P 70 O2 : 97% RA

## 2018-04-10 NOTE — ED Provider Notes (Addendum)
[facility] EMERGENCY DEPARTMENT Provider Note   CSN: [id number] Arrival date & time: 04/10/18  1932     History   Chief Complaint Chief Complaint  Patient presents with  . Shortness of Breath  . Chest Pain    HPI Elizabeth Mullins is a 84 y.o. female presenting for evaluation of chest pain and hypertension.  Patient states for the past 2 days, she has been feeling chest discomfort.  Additionally, she her blood pressure has been elevated these past few days.  She feels like her heart is racing.  She reports a history of hypertension for which she takes atenolol and lisinopril, which she has been taking as prescribed.  She denies any recent change in her medication.  She denies fevers, chills, cough, nausea, vomiting, abdominal pain, urinary symptoms, normal bowel movements.  She denies leg pain or swelling.  She denies headache, vision changes, slurred speech, numbness/tingling, or weakness of any extremity.  She denies change in her diet recently.  She denies known trigger for her high blood pressure.  Patient followed up with urgent care tonight, where it was found that her blood pressure was 200/100.  Considering her age, hypertension, and chest discomfort, patient was given clonidine and transferred to the emergency room.  Upon arrival to the ER, patient reports complete resolution of symptoms, no longer feeling like her heart is racing or having any chest discomfort.  She denies a history of heart problems or ACS.  Denies a family history of ACS.  She denies tobacco use.  She takes 2 Excedrin daily for aspirin and acetaminophen dosing, has been doing this for years.  She is not on any blood thinners.   Additional history obtained from EMS.  Per EMS, patient was noted to have some PVCs, but EKG otherwise reassuring.  Blood pressure at its highest was 400 systolic with EMS  HPI  Past Medical History:  Diagnosis Date  . Allergic headache   . Arthritis    "all over"  .  Chronic back pain    "top of my neck to bottom of my feet" (08/25/2015)  . Diverticulitis   . H/O drainage of abscess    left buttocks area -closed drainage bag in place 10-12-15   . History of blood transfusion    58 years ago   . Hypertension   . Impaired mobility    "cervical neck- range of motion limitations"  . Scoliosis   . TIA (transient ischemic attack) 06/03/2016   admitted with symtoms of tia all tests normal all symptoms resolved    Patient Active Problem List   Diagnosis Date Noted  . H/O ventral hernia 06/22/2016  . Incisional hernia RLQ s/p lap repair w mesh 06/21/2016 06/21/2016  . Right inguinal hernia s/p lap repair w mesh 06/21/2016 06/21/2016  . Insomnia 06/21/2016  . TIA (transient ischemic attack) 06/03/2016  . Cervical pain (neck) 10/19/2015  . Diverticulitis of large intestine with perforation and abscess without bleeding 10/18/2015  . Anxiety 10/12/2015  . Diverticulitis of colon with perforation 08/24/2015  . Abnormal urinalysis 08/24/2015  . ?? Pancreatic mass 08/24/2015  . IBS (irritable bowel syndrome) 08/24/2015  . Inflammatory arthropathy 08/24/2015  . Chronic back pain 08/24/2015  . Cochlear hearing loss, bilateral 06/08/2015  . Diverticulitis large intestine 05/25/2015  . DD (diverticular disease) 10/31/2014  . Generalized osteoarthritis of multiple sites 10/31/2014  . Headache disorder 10/25/2014  . Meningioma ([place]) 10/25/2014  . Essential hypertension 10/04/2013  . Excess weight 10/04/2013  .  Cervical radiculopathy 10/04/2013  . Degeneration of intervertebral disc of lumbar region 09/22/2013  . Lumbar radiculopathy 09/22/2013    Past Surgical History:  Procedure Laterality Date  . APPENDECTOMY  1960s  . COLON SURGERY  10/18/2015  . CT GUIDE ABSCESS DRAINAGE  ([place] [facility])  08/25/2015   pelvic diverticular abscess  . DILATION AND CURETTAGE OF UTERUS    . INSERTION OF MESH N/A 06/21/2016   Procedure: INSERTION OF MESH;  Surgeon: Michael Boston, MD;   Location: [facility];  Service: General;  Laterality: N/A;  . IR GENERIC HISTORICAL  09/20/2015   IR RADIOLOGIST EVAL & MGMT 09/20/2015 Monia Sabal, PA-C [facility] INTERV RAD  . IR GENERIC HISTORICAL  10/03/2015   IR RADIOLOGIST EVAL & MGMT 10/03/2015 [facility] INTERV RAD  . JOINT REPLACEMENT  2002   Dr Gladstone Lighter  . TONSILLECTOMY    . TOTAL KNEE ARTHROPLASTY Right   . VAGINAL HYSTERECTOMY  1973   "partial"  . VENTRAL HERNIA REPAIR N/A 06/21/2016   Procedure: LAPAROSCOPIC VENTRAL WALL HERNIA REPAIR WITH ERAS PATHWAY;  Surgeon: Michael Boston, MD;  Location: [facility]S;  Service: General;  Laterality: N/A;     OB History   None      Home Medications    Prior to Admission medications   Medication Sig Start Date End Date Taking? Authorizing Provider  Ascorbic Acid (VITAMIN C) 100 MG tablet Take 250 mg by mouth daily.    [provider]  aspirin 81 MG EC tablet Take 1 tablet (81 mg total) by mouth daily. 06/06/16   Asencion Partridge, MD  atenolol (TENORMIN) 50 MG tablet Take 25 mg by mouth daily.     [provider]  carisoprodol (SOMA) 350 MG tablet Take 350 mg by mouth 4 (four) times daily as needed for muscle spasms.    [provider]  doxazosin (CARDURA) 2 MG tablet Take 1 tablet (2 mg total) by mouth daily. 10/30/15   Michael Boston, MD  gabapentin (NEURONTIN) 800 MG tablet Take 800 mg by mouth 3 (three) times daily.    [provider]  HYDROcodone-acetaminophen (NORCO/VICODIN) 5-325 MG tablet Take 1-2 tablets by mouth every 4 (four) hours as needed for moderate pain or severe pain. 06/21/16   Michael Boston, MD  hyoscyamine (LEVSIN, ANASPAZ) 0.125 MG tablet Take 1 tablet by mouth daily. 04/19/16   [provider]  lisinopril (PRINIVIL,ZESTRIL) 10 MG tablet Take 1 tablet (10 mg total) by mouth daily. 06/06/16   Asencion Partridge, MD  menthol-cetylpyridinium (CEPACOL) 3 MG lozenge Take 1 lozenge (3 mg total) by mouth as needed for sore throat. Patient not taking: Reported on  06/18/2016 06/05/16   Asencion Partridge, MD  polyethylene glycol [facility] Floria Raveling) packet Take 17 g by mouth daily.    [provider]  raloxifene (EVISTA) 60 MG tablet Take 60 mg by mouth daily.    [provider]  Red Yeast Rice 600 MG CAPS Take 600 mg by mouth daily.    [provider]  thiamine (VITAMIN B-1) 100 MG tablet Take 100 mg by mouth daily.    [provider]  vitamin E (VITAMIN E) 400 UNIT capsule Take 400 Units by mouth daily.    [provider]    Family History No family history on file.  Social History Social History   Tobacco Use  . Smoking status: Never Smoker  . Smokeless tobacco: Never Used  Substance Use Topics  . Alcohol use: No  . Drug use: No  Allergies   Valsartan-hydrochlorothiazide; Codeine; Demerol [meperidine]; Morphine and related; and Nsaids   Review of Systems Review of Systems  Cardiovascular: Positive for palpitations.       Chest discomfort  All other systems reviewed and are negative.    Physical Exam Updated Vital Signs BP 136/63   Pulse 71   Temp 98.3 F (36.8 C) (Oral)   Resp 12   SpO2 97%   Physical Exam  Constitutional: She is oriented to person, place, and time. She appears well-developed and well-nourished. No distress.  Elderly female resting comfortably in the bed in no acute distress  HENT:  Head: Normocephalic and atraumatic.  Eyes: Pupils are equal, round, and reactive to light. Conjunctivae and EOM are normal.  EOMI and PERRLA. No nystagmus  Neck: Normal range of motion. Neck supple.  Cardiovascular: Normal rate, regular rhythm and intact distal pulses.  Pulmonary/Chest: Effort normal and breath sounds normal. No respiratory distress. She has no wheezes. She exhibits no tenderness.  Speaking in full sentences.  Clear lung sounds in all fields.  No chest wall tenderness.  Abdominal: Soft. She exhibits no distension and no mass. There is no tenderness. There is no  rebound and no guarding.  Musculoskeletal: Normal range of motion.  Strength intact x4.  Sensation intact x4.  Radial and pedal pulses intact bilaterally.  Soft compartments.  Neurological: She is alert and oriented to person, place, and time.  No obvious neurologic deficit.  CN intact.  Nose to finger intact.  Grip strength intact.  Fine movement and coordination intact.  Skin: Skin is warm and dry.  Psychiatric: She has a normal mood and affect.  Nursing note and vitals reviewed.    ED Treatments / Results  Labs (all labs ordered are listed, but only abnormal results are displayed) Labs Reviewed  COMPREHENSIVE METABOLIC PANEL - Abnormal; Notable for the following components:      Result Value   Glucose, Bld 113 (*)    BUN 26 (*)    All other components within normal limits  CBC  URINALYSIS, ROUTINE W REFLEX MICROSCOPIC  I-STAT TROPONIN, ED    EKG EKG Interpretation  Date/Time:  Friday April 10 2018 19:44:30 EST Ventricular Rate:  77 PR Interval:    QRS Duration: 83 QT Interval:  371 QTC Calculation: 420 R Axis:   53 Text Interpretation:  Sinus rhythm Confirmed by Nat Christen [phone]) on 04/10/2018 8:54:21 PM   Radiology Dg Chest 2 View  Result Date: 04/10/2018 CLINICAL DATA:  High blood pressure with tachycardia and shortness of breath. EXAM: CHEST - 2 VIEW COMPARISON:  Abdominal CT 10/03/2015. FINDINGS: The heart size and mediastinal contours are normal. There is probable mild emphysema and mild bibasilar atelectasis. No confluent airspace opacity, pleural effusion or pneumothorax. There is a possible bone island in the proximal right humerus and a mid thoracic compression deformity which does not appear acute. Mild degenerative changes are present in the thoracic spine. IMPRESSION: Limited comparison studies. No suspected acute findings. Nonacute appearing midthoracic compression deformity. Electronically Signed   By: Richardean Sale M.D.   On: 04/10/2018 20:51     Procedures .Critical Care Performed by: Franchot Heidelberg, PA-C Authorized by: Franchot Heidelberg, PA-C   Critical care provider statement:    Critical care time (minutes):  50   Critical care time was exclusive of:  Separately billable procedures and treating other patients and teaching time   Critical care was necessary to treat or prevent imminent or life-threatening deterioration of the following  conditions:  Respiratory failure   Critical care was time spent personally by me on the following activities:  Blood draw for specimens, development of treatment plan with patient or surrogate, discussions with consultants, evaluation of patient's response to treatment, examination of patient, obtaining history from patient or surrogate, ordering and performing treatments and interventions, ordering and review of laboratory studies, ordering and review of radiographic studies, pulse oximetry, re-evaluation of patient's condition and review of old charts   I assumed direction of critical care for this patient from another provider in my specialty: no   Comments:     Pt presenting with wheezing and decreased air movement. Continuous nebulizer given. Pt with persistent hypoxia requiring O2.    (including critical care time)  Medications Ordered in ED Medications - No data to display   Initial Impression / Assessment and Plan / ED Course  I have reviewed the triage vital signs and the nursing notes.  Pertinent labs & imaging results that were available during my care of the patient were reviewed by me and considered in my medical decision making (see chart for details).     Pt presenting for evaluation of high blood pressure and chest discomfort.  Physical exam reassuring, blood pressure improving, 509 systolic on arrival.  Patient symptoms have completely resolved, no further chest discomfort or palpitations.  On exam, patient without neurologic deficit.  While no signs of endorgan damage at  this time, patient was having symptoms, so will obtain basic blood work, troponin, EKG, and chest x-ray and reassess.  Labs reassuring, troponin negative.  Creatinine stable.  Chest x-ray viewed interpreted by me, no pneumonia, pneumothorax, effusion, or cardiomegaly.  EKG without STEMI or concerning findings.  No PVCs noted.  Doubt hypertensive emergency at this time.  Doubt ACS.  Doubt emergent medical cause requiring admission.  Case discussed with attending, Dr. Lacinda Axon evaluated the patient.   Discussed findings with patient and family.  Discussed importance of follow-up for further management of her blood pressure.  Patient's family requesting follow-up with cardiology, considering patient had PVCs and chest discomfort. will have patient follow-up with cardiology as able.  Return precautions given.  At this time, patient appears safe for discharge.  Return precautions given.  Patient states she understands and agrees to plan.   Final Clinical Impressions(s) / ED Diagnoses   Final diagnoses:  Hypertension, unspecified type  PVC (premature ventricular contraction)    ED Discharge Orders    None       Franchot Heidelberg, PA-C 04/10/18 [id number]    Franchot Heidelberg, PA-C 04/10/18 2239    Nat Christen, MD 04/11/18 [phone]

## 2018-04-10 NOTE — ED Notes (Signed)
Pt's daughter stepped to nurses station to inform this EMT that they had decided to opt for finding an outpatient cardiologist over an admission for the current condition following a conversation with Lacinda Axon, MD [place]. Lacinda Axon, MD notified of pt request.

## 2018-07-13 ENCOUNTER — Other Ambulatory Visit (HOSPITAL_COMMUNITY): Payer: Self-pay | Admitting: *Deleted

## 2018-07-13 NOTE — Patient Instructions (Addendum)
Elizabeth Mullins    Your procedure is scheduled on: 07-15-2018  Report to [facility] Main  Entrance  Report to [place] at 530 AM    Call this number if you have problems the morning of surgery [phone]    Remember: Do not eat food or drink liquids :After Midnight. BRUSH YOUR TEETH MORNING OF SURGERY AND RINSE YOUR MOUTH OUT, NO CHEWING GUM CANDY OR MINTS.     Take these medicines the morning of surgery with A SIP OF WATER:  ESCITALOPRAM (LEXAPRO), GABAPENTIN (NEURONTIN), ES TYLENOL IF NEEDED                               You may not have any metal on your body including hair pins and              piercings  Do not wear jewelry, make-up, lotions, powders or perfumes, deodorant             Do not wear nail polish.  Do not shave  48 hours prior to surgery.              Men may shave face and neck.   Do not bring valuables to the hospital. [place].  Contacts, dentures or bridgework may not be worn into surgery.  Leave suitcase in the car. After surgery it may be brought to your room.                  Please read over the following fact sheets you were given: _____________________________________________________________________             [facility] - Preparing for Surgery Before surgery, you can play an important role.  Because skin is not sterile, your skin needs to be as free of germs as possible.  You can reduce the number of germs on your skin by washing with CHG (chlorahexidine gluconate) soap before surgery.  CHG is an antiseptic cleaner which kills germs and bonds with the skin to continue killing germs even after washing. Please DO NOT use if you have an allergy to CHG or antibacterial soaps.  If your skin becomes reddened/irritated stop using the CHG and inform your nurse when you arrive at Short Stay. Do not shave (including legs and underarms) for at least 48 hours prior to the first CHG shower.   You may shave your face/neck. Please follow these instructions carefully:  1.  Shower with CHG Soap the night before surgery and the  morning of Surgery.  2.  If you choose to wash your hair, wash your hair first as usual with your  normal  shampoo.  3.  After you shampoo, rinse your hair and body thoroughly to remove the  shampoo.                           4.  Use CHG as you would any other liquid soap.  You can apply chg directly  to the skin and wash                       Gently with a scrungie or clean washcloth.  5.  Apply the CHG Soap  to your body ONLY FROM THE NECK DOWN.   Do not use on face/ open                           Wound or open sores. Avoid contact with eyes, ears mouth and genitals (private parts).                       Wash face,  Genitals (private parts) with your normal soap.             6.  Wash thoroughly, paying special attention to the area where your surgery  will be performed.  7.  Thoroughly rinse your body with warm water from the neck down.  8.  DO NOT shower/wash with your normal soap after using and rinsing off  the CHG Soap.                9.  Pat yourself dry with a clean towel.            10.  Wear clean pajamas.            11.  Place clean sheets on your bed the night of your first shower and do not  sleep with pets. Day of Surgery : Do not apply any lotions/deodorants the morning of surgery.  Please wear clean clothes to the hospital/surgery center.  FAILURE TO FOLLOW THESE INSTRUCTIONS MAY RESULT IN THE CANCELLATION OF YOUR SURGERY PATIENT SIGNATURE_________________________________  NURSE SIGNATURE__________________________________  ________________________________________________________________________   Adam Phenix  An incentive spirometer is a tool that can help keep your lungs clear and active. This tool measures how well you are filling your lungs with each breath. Taking long deep breaths may help reverse or decrease the chance of  developing breathing (pulmonary) problems (especially infection) following:  A long period of time when you are unable to move or be active. BEFORE THE PROCEDURE   If the spirometer includes an indicator to show your best effort, your nurse or respiratory therapist will set it to a desired goal.  If possible, sit up straight or lean slightly forward. Try not to slouch.  Hold the incentive spirometer in an upright position. INSTRUCTIONS FOR USE  1. Sit on the edge of your bed if possible, or sit up as far as you can in bed or on a chair. 2. Hold the incentive spirometer in an upright position. 3. Breathe out normally. 4. Place the mouthpiece in your mouth and seal your lips tightly around it. 5. Breathe in slowly and as deeply as possible, raising the piston or the ball toward the top of the column. 6. Hold your breath for 3-5 seconds or for as long as possible. Allow the piston or ball to fall to the bottom of the column. 7. Remove the mouthpiece from your mouth and breathe out normally. 8. Rest for a few seconds and repeat Steps 1 through 7 at least 10 times every 1-2 hours when you are awake. Take your time and take a few normal breaths between deep breaths. 9. The spirometer may include an indicator to show your best effort. Use the indicator as a goal to work toward during each repetition. 10. After each set of 10 deep breaths, practice coughing to be sure your lungs are clear. If you have an incision (the cut made at the time of surgery), support your incision when coughing by placing a pillow or rolled up towels firmly  against it. Once you are able to get out of bed, walk around indoors and cough well. You may stop using the incentive spirometer when instructed by your caregiver.  RISKS AND COMPLICATIONS  Take your time so you do not get dizzy or light-headed.  If you are in pain, you may need to take or ask for pain medication before doing incentive spirometry. It is harder to take a  deep breath if you are having pain. AFTER USE  Rest and breathe slowly and easily.  It can be helpful to keep track of a log of your progress. Your caregiver can provide you with a simple table to help with this. If you are using the spirometer at home, follow these instructions: [place] IF:   You are having difficultly using the spirometer.  You have trouble using the spirometer as often as instructed.  Your pain medication is not giving enough relief while using the spirometer.  You develop fever of 100.5 F (38.1 C) or higher. SEEK IMMEDIATE MEDICAL CARE IF:   You cough up bloody sputum that had not been present before.  You develop fever of 102 F (38.9 C) or greater.  You develop worsening pain at or near the incision site. MAKE SURE YOU:   Understand these instructions.  Will watch your condition.  Will get help right away if you are not doing well or get worse. Document Released: 09/16/2006 Document Revised: 07/29/2011 Document Reviewed: 11/17/2006 ExitCare Patient Information 2014 Exit[employer], [place].   ________________________________________________________________________  WHAT IS A BLOOD TRANSFUSION? Blood Transfusion Information  A transfusion is the replacement of blood or some of its parts. Blood is made up of multiple cells which provide different functions.  Red blood cells carry oxygen and are used for blood loss replacement.  White blood cells fight against infection.  Platelets control bleeding.  Plasma helps clot blood.  Other blood products are available for specialized needs, such as hemophilia or other clotting disorders. BEFORE THE TRANSFUSION  Who gives blood for transfusions?   Healthy volunteers who are fully evaluated to make sure their blood is safe. This is blood bank blood. Transfusion therapy is the safest it has ever been in the practice of medicine. Before blood is taken from a donor, a complete history is taken to make  sure that person has no history of diseases nor engages in risky social behavior (examples are intravenous drug use or sexual activity with multiple partners). The donor's travel history is screened to minimize risk of transmitting infections, such as malaria. The donated blood is tested for signs of infectious diseases, such as HIV and hepatitis. The blood is then tested to be sure it is compatible with you in order to minimize the chance of a transfusion reaction. If you or a relative donates blood, this is often done in anticipation of surgery and is not appropriate for emergency situations. It takes many days to process the donated blood. RISKS AND COMPLICATIONS Although transfusion therapy is very safe and saves many lives, the main dangers of transfusion include:   Getting an infectious disease.  Developing a transfusion reaction. This is an allergic reaction to something in the blood you were given. Every precaution is taken to prevent this. The decision to have a blood transfusion has been considered carefully by your caregiver before blood is given. Blood is not given unless the benefits outweigh the risks. AFTER THE TRANSFUSION  Right after receiving a blood transfusion, you will usually feel much better and  more energetic. This is especially true if your red blood cells have gotten low (anemic). The transfusion raises the level of the red blood cells which carry oxygen, and this usually causes an energy increase.  The nurse administering the transfusion will monitor you carefully for complications. HOME CARE INSTRUCTIONS  No special instructions are needed after a transfusion. You may find your energy is better. Speak with your caregiver about any limitations on activity for underlying diseases you may have. SEEK MEDICAL CARE IF:   Your condition is not improving after your transfusion.  You develop redness or irritation at the intravenous (IV) site. SEEK IMMEDIATE MEDICAL CARE IF:   Any of the following symptoms occur over the next 12 hours:  Shaking chills.  You have a temperature by mouth above 102 F (38.9 C), not controlled by medicine.  Chest, back, or muscle pain.  People around you feel you are not acting correctly or are confused.  Shortness of breath or difficulty breathing.  Dizziness and fainting.  You get a rash or develop hives.  You have a decrease in urine output.  Your urine turns a dark color or changes to pink, red, or brown. Any of the following symptoms occur over the next 10 days:  You have a temperature by mouth above 102 F (38.9 C), not controlled by medicine.  Shortness of breath.  Weakness after normal activity.  The white part of the eye turns yellow (jaundice).  You have a decrease in the amount of urine or are urinating less often.  Your urine turns a dark color or changes to pink, red, or brown. Document Released: 05/03/2000 Document Revised: 07/29/2011 Document Reviewed: 12/21/2007 [facility] Patient Information 2014 [place], [place].  _______________________________________________________________________

## 2018-07-13 NOTE — Progress Notes (Signed)
CARDIAC CLEARANCE BRITTANY SPENCER PA 06-17-2018 ON CHART MEDICAL CLEARANCE DR Steele Sizer 03-10-18 ON CHART ECHO 06-04-16 Epic CHEST XRAY 04-10-28 Epic  EKG 04-10-18 Epic EKG DR Steele Sizer 03-10-18 ON CHART

## 2018-07-14 ENCOUNTER — Other Ambulatory Visit: Payer: Self-pay

## 2018-07-14 ENCOUNTER — Encounter (HOSPITAL_COMMUNITY)
Admission: RE | Admit: 2018-07-14 | Discharge: 2018-07-14 | Disposition: A | Discharge status: Home / Self Care | Payer: Medicare Other | Source: Ambulatory Visit | Attending: Orthopedic Surgery | Admitting: Orthopedic Surgery

## 2018-07-14 ENCOUNTER — Encounter (HOSPITAL_COMMUNITY): Payer: Self-pay

## 2018-07-14 ENCOUNTER — Encounter (HOSPITAL_COMMUNITY): Payer: Self-pay | Admitting: Anesthesiology

## 2018-07-14 DIAGNOSIS — Z01812 Encounter for preprocedural laboratory examination: Secondary | ICD-10-CM

## 2018-07-14 LAB — SURGICAL PCR SCREEN
MRSA, PCR: NEGATIVE
Staphylococcus aureus: NEGATIVE

## 2018-07-14 LAB — PROTIME-INR
INR: 0.9 (ref 0.8–1.2)
Prothrombin Time: 11.9 seconds (ref 11.4–15.2)

## 2018-07-14 LAB — APTT: aPTT: 27 seconds (ref 24–36)

## 2018-07-14 MED ORDER — BUPIVACAINE LIPOSOME 1.3 % IJ SUSP
20.0000 mL | Freq: Once | INTRAMUSCULAR | Status: DC
  Filled 2018-07-14: qty 20

## 2018-07-14 NOTE — H&P (Signed)
TOTAL KNEE ADMISSION H&P  Patient is being admitted for left total knee arthroplasty.  Subjective:  Chief Complaint:left knee pain.  HPI: Elizabeth Mullins, 85 y.o. female, has a history of pain and functional disability in the left knee due to arthritis and has failed non-surgical conservative treatments for greater than 12 weeks to includeNSAID's and/or analgesics, corticosteriod injections, flexibility and strengthening excercises and activity modification.  Onset of symptoms was gradual, starting 5 years ago with gradually worsening course since that time. The patient noted no past surgery on the left knee(s).  Patient currently rates pain in the left knee(s) at 8 out of 10 with activity. Patient has night pain, worsening of pain with activity and weight bearing, pain that interferes with activities of daily living, pain with passive range of motion, crepitus and joint swelling.  Patient has evidence of periarticular osteophytes and joint space narrowing by imaging studies.There is no active infection.  Patient Active Problem List   Diagnosis Date Noted  . H/O ventral hernia 06/22/2016  . Incisional hernia RLQ s/p lap repair w mesh 06/21/2016 06/21/2016  . Right inguinal hernia s/p lap repair w mesh 06/21/2016 06/21/2016  . Insomnia 06/21/2016  . TIA (transient ischemic attack) 06/03/2016  . Cervical pain (neck) 10/19/2015  . Diverticulitis of large intestine with perforation and abscess without bleeding 10/18/2015  . Anxiety 10/12/2015  . Diverticulitis of colon with perforation 08/24/2015  . Abnormal urinalysis 08/24/2015  . ?? Pancreatic mass 08/24/2015  . IBS (irritable bowel syndrome) 08/24/2015  . Inflammatory arthropathy 08/24/2015  . Chronic back pain 08/24/2015  . Cochlear hearing loss, bilateral 06/08/2015  . Diverticulitis large intestine 05/25/2015  . DD (diverticular disease) 10/31/2014  . Generalized osteoarthritis of multiple sites 10/31/2014  . Headache disorder 10/25/2014   . Meningioma ([place]) 10/25/2014  . Essential hypertension 10/04/2013  . Excess weight 10/04/2013  . Cervical radiculopathy 10/04/2013  . Degeneration of intervertebral disc of lumbar region 09/22/2013  . Lumbar radiculopathy 09/22/2013   Past Medical History:  Diagnosis Date  . Allergic headache   . Arthritis    "all over"  . Chronic back pain    "top of my neck to bottom of my feet" (08/25/2015)  . Diverticulitis   . H/O drainage of abscess    left buttocks area -closed drainage bag in place 10-12-15   . History of blood transfusion    58 years ago   . Hypertension   . Impaired mobility    "cervical neck- range of motion limitations"  . Scoliosis   . TIA (transient ischemic attack) 06/03/2016   admitted with symtoms of tia all tests normal all symptoms resolved    Past Surgical History:  Procedure Laterality Date  . APPENDECTOMY  1960s  . COLON SURGERY  10/18/2015  . CT GUIDE ABSCESS DRAINAGE  ([place] HX)  08/25/2015   pelvic diverticular abscess  . DILATION AND CURETTAGE OF UTERUS    . EYE SURGERY Right 07/14/2017   CATARACT  . INSERTION OF MESH N/A 06/21/2016   Procedure: INSERTION OF MESH;  Surgeon: Michael Boston, MD;  Location: [facility] ORS;  Service: General;  Laterality: N/A;  . IR GENERIC HISTORICAL  09/20/2015   IR RADIOLOGIST EVAL & MGMT 09/20/2015 Monia Sabal, PA-C [facility] INTERV RAD  . IR GENERIC HISTORICAL  10/03/2015   IR RADIOLOGIST EVAL & MGMT 10/03/2015 [facility] INTERV RAD  . JOINT REPLACEMENT  2002   Dr Gladstone Lighter  . TONSILLECTOMY    . TOTAL KNEE ARTHROPLASTY Right 2002  .  VAGINAL HYSTERECTOMY  1973   "partial"  . VENTRAL HERNIA REPAIR N/A 06/21/2016   Procedure: LAPAROSCOPIC VENTRAL WALL HERNIA REPAIR WITH ERAS PATHWAY;  Surgeon: Michael Boston, MD;  Location: [facility] ORS;  Service: General;  Laterality: N/A;    Current Facility-Administered Medications  Medication Dose Route Frequency Provider Last Rate Last Dose  . [START ON 07/15/2018] bupivacaine liposome (EXPAREL) 1.3 %  injection 266 mg  20 mL Other Once Latanya Maudlin, MD       Current Outpatient Medications  Medication Sig Dispense Refill Last Dose  . aspirin-acetaminophen-caffeine (EXCEDRIN MIGRAINE) 250-250-65 MG tablet Take 1 tablet by mouth 2 (two) times daily.     Marland Kitchen atenolol (TENORMIN) 25 MG tablet Take 25 mg by mouth every evening.  1   . B Complex-C (B-COMPLEX WITH VITAMIN C) tablet Take 1 tablet by mouth daily.     . beta carotene w/minerals (OCUVITE) tablet Take 1 tablet by mouth daily.     . carisoprodol (SOMA) 350 MG tablet Take 350 mg by mouth at bedtime.    06/20/2016 at 2030  . doxazosin (CARDURA) 1 MG tablet Take 1 mg by mouth at bedtime.     Marland Kitchen escitalopram (LEXAPRO) 20 MG tablet Take 20 mg by mouth daily.     Marland Kitchen gabapentin (NEURONTIN) 800 MG tablet Take 800 mg by mouth 2 (two) times daily.    06/21/2016 at 0730  . lisinopril (PRINIVIL,ZESTRIL) 40 MG tablet Take 40 mg by mouth daily.     . Omega-3 Fatty Acids (FISH OIL PO) Take 1 capsule by mouth daily.     . polyethylene glycol powder (GLYCOLAX/MIRALAX) powder Take 17 g by mouth daily.     . Red Yeast Rice 600 MG CAPS Take 1,200 mg by mouth daily.    06/20/2016 at 0730  . traMADol (ULTRAM) 50 MG tablet Take 50 mg by mouth 2 (two) times daily as needed (pain.).     Marland Kitchen vitamin E (VITAMIN E) 400 UNIT capsule Take 400 Units by mouth daily.   06/20/2016 at 0730  . acetaminophen (TYLENOL) 500 MG tablet Take 500 mg by mouth every 6 (six) hours as needed.      Allergies  Allergen Reactions  . Valsartan-Hydrochlorothiazide     Low sodium, high potassium  . Codeine Nausea And Vomiting and Other (See Comments)    Not a true allergy she needs nausea medication if given this medication.  . Demerol [Meperidine] Nausea And Vomiting and Other (See Comments)    Not a true allergy she needs nausea medication if given this medication.   . Morphine And Related Nausea And Vomiting and Other (See Comments)    Not a true allergy she needs nausea medication if given  this medication.     Social History   Tobacco Use  . Smoking status: Never Smoker  . Smokeless tobacco: Never Used  Substance Use Topics  . Alcohol use: No    Review of Systems  Constitutional: Negative.   HENT: Negative.   Eyes: Negative.   Respiratory: Negative.   Cardiovascular: Negative.   Gastrointestinal: Negative.   Genitourinary: Negative.   Musculoskeletal: Positive for back pain, joint pain and myalgias. Negative for falls and neck pain.  Skin: Negative.   Neurological: Negative.   Endo/Heme/Allergies: Positive for environmental allergies. Negative for polydipsia. Does not bruise/bleed easily.  Psychiatric/Behavioral: Negative.     Objective:  Physical Exam  Constitutional: She is oriented to person, place, and time. She appears well-developed. No distress.  Overweight  HENT:  Head: Normocephalic and atraumatic.  Right Ear: External ear normal.  Left Ear: External ear normal.  Nose: Nose normal.  Mouth/Throat: Oropharynx is clear and moist.  Eyes: Conjunctivae and EOM are normal.  Neck: Neck supple.  Cardiovascular: Normal rate, regular rhythm, normal heart sounds and intact distal pulses.  No murmur heard. Respiratory: Effort normal and breath sounds normal. No respiratory distress. She has no wheezes.  GI: Soft. Bowel sounds are normal. She exhibits no distension. There is no abdominal tenderness.  Musculoskeletal:     Right hip: Normal.     Left hip: Normal.     Right knee: Normal.     Left knee: She exhibits decreased range of motion and swelling. Tenderness found. Medial joint line and lateral joint line tenderness noted.     Comments: Left knee 5-120 degrees. Moderate swelling. No effusion. No erythema. Moderate patellofemoral crepitus. Varus deformity noted  Neurological: She is alert and oriented to person, place, and time. She has normal strength. No sensory deficit.  Skin: No rash noted. She is not diaphoretic. No erythema.  Psychiatric: She has  a normal mood and affect. Her behavior is normal.    Vital signs in last 24 hours: Temp:  [97.7 F (36.5 C)] 97.7 F (36.5 C) (02/25 1039) Pulse Rate:  [83] 83 (02/25 1039) Resp:  [18] 18 (02/25 1039) BP: (146)/(101) 146/101 (02/25 1111) SpO2:  [98 %] 98 % (02/25 1039) Weight:  [64.9 kg] 64.9 kg (02/25 1039)  Labs:   Estimated body mass index is 28.88 kg/m as calculated from the following:   Height as of 07/14/18: 4\' 11"  (1.499 m).   Weight as of 07/14/18: 64.9 kg.   Imaging Review Plain radiographs demonstrate severe degenerative joint disease of the left knee(s). The overall alignment ismild varus. The bone quality appears to be good for age and reported activity level.      Assessment/Plan:  End stage primary osteoarthritis, left knee   The patient history, physical examination, clinical judgment of the provider and imaging studies are consistent with end stage degenerative joint disease of the left knee(s) and total knee arthroplasty is deemed medically necessary. The treatment options including medical management, injection therapy arthroscopy and arthroplasty were discussed at length. The risks and benefits of total knee arthroplasty were presented and reviewed. The risks due to aseptic loosening, infection, stiffness, patella tracking problems, thromboembolic complications and other imponderables were discussed. The patient acknowledged the explanation, agreed to proceed with the plan and consent was signed. Patient is being admitted for inpatient treatment for surgery, pain control, PT, OT, prophylactic antibiotics, VTE prophylaxis, progressive ambulation and ADL's and discharge planning. The patient is planning to be discharged home with home health services    Anticipated LOS equal to or greater than 2 midnights due to - Age 58 and older with one or more of the following:  - Obesity  - Expected need for hospital services (PT, OT, Nursing) required for safe   discharge  - Anticipated need for postoperative skilled nursing care or inpatient rehab  - Active co-morbidities: history of TIA OR   - Unanticipated findings during/Post Surgery: None  - Patient is a high risk of re-admission due to: None    Therapy Plans: HHPT then outpatient therapy Disposition: Home with daughter Planned DVT prophylaxis: aspirin 325mg  BID DME needed: walker and bed pan PCP: Wallis Bamberg Cardio: Marin Olp, PA-C  Nadra Hritz [place], [place]

## 2018-07-14 NOTE — Progress Notes (Signed)
UA, CBC WITH DIF, CMET CARE EVERYWHERE [place] DONE 07-07-2018

## 2018-07-15 ENCOUNTER — Encounter (HOSPITAL_COMMUNITY): Payer: Self-pay | Admitting: Emergency Medicine

## 2018-07-15 ENCOUNTER — Other Ambulatory Visit: Payer: Self-pay

## 2018-07-15 ENCOUNTER — Encounter (HOSPITAL_COMMUNITY)
Admission: RE | Disposition: A | Discharge status: Home Health | Payer: Self-pay | Source: Ambulatory Visit | Attending: Orthopedic Surgery

## 2018-07-15 ENCOUNTER — Inpatient Hospital Stay (HOSPITAL_COMMUNITY): Payer: Medicare Other | Admitting: Anesthesiology

## 2018-07-15 ENCOUNTER — Inpatient Hospital Stay (HOSPITAL_COMMUNITY)
Admission: RE | Admit: 2018-07-15 | Discharge: 2018-07-17 | DRG: 470 | Disposition: A | Discharge status: Home Health | Payer: Medicare Other | Source: Ambulatory Visit | Attending: Orthopedic Surgery | Admitting: Orthopedic Surgery

## 2018-07-15 DIAGNOSIS — E669 Obesity, unspecified: Secondary | ICD-10-CM | POA: Diagnosis present

## 2018-07-15 DIAGNOSIS — Z79899 Other long term (current) drug therapy: Secondary | ICD-10-CM

## 2018-07-15 DIAGNOSIS — Z888 Allergy status to other drugs, medicaments and biological substances status: Secondary | ICD-10-CM | POA: Diagnosis not present

## 2018-07-15 DIAGNOSIS — I10 Essential (primary) hypertension: Secondary | ICD-10-CM | POA: Diagnosis present

## 2018-07-15 DIAGNOSIS — M1712 Unilateral primary osteoarthritis, left knee: Principal | ICD-10-CM | POA: Diagnosis present

## 2018-07-15 DIAGNOSIS — M24562 Contracture, left knee: Secondary | ICD-10-CM | POA: Diagnosis present

## 2018-07-15 DIAGNOSIS — Z6828 Body mass index (BMI) 28.0-28.9, adult: Secondary | ICD-10-CM

## 2018-07-15 DIAGNOSIS — F419 Anxiety disorder, unspecified: Secondary | ICD-10-CM | POA: Diagnosis present

## 2018-07-15 DIAGNOSIS — Z96652 Presence of left artificial knee joint: Secondary | ICD-10-CM

## 2018-07-15 DIAGNOSIS — H918X3 Other specified hearing loss, bilateral: Secondary | ICD-10-CM | POA: Diagnosis present

## 2018-07-15 DIAGNOSIS — G4700 Insomnia, unspecified: Secondary | ICD-10-CM | POA: Diagnosis present

## 2018-07-15 DIAGNOSIS — Z8673 Personal history of transient ischemic attack (TIA), and cerebral infarction without residual deficits: Secondary | ICD-10-CM | POA: Diagnosis not present

## 2018-07-15 DIAGNOSIS — Z86011 Personal history of benign neoplasm of the brain: Secondary | ICD-10-CM

## 2018-07-15 DIAGNOSIS — Z96651 Presence of right artificial knee joint: Secondary | ICD-10-CM | POA: Diagnosis present

## 2018-07-15 HISTORY — PX: TOTAL KNEE ARTHROPLASTY: SHX125

## 2018-07-15 LAB — TYPE AND SCREEN
ABO/RH(D): O POS
Antibody Screen: NEGATIVE

## 2018-07-15 SURGERY — ARTHROPLASTY, KNEE, TOTAL
Anesthesia: Spinal | Site: Knee | Laterality: Left

## 2018-07-15 MED ORDER — ACETAMINOPHEN 325 MG PO TABS: 325.0000 mg | ORAL_TABLET | Freq: Four times a day (QID) | ORAL | Status: DC | PRN

## 2018-07-15 MED ORDER — ESCITALOPRAM OXALATE 20 MG PO TABS
20.0000 mg | ORAL_TABLET | Freq: Every day | ORAL | Status: DC
  Administered 2018-07-16 – 2018-07-17 (×2): 20 mg via ORAL
  Filled 2018-07-15 (×2): qty 1

## 2018-07-15 MED ORDER — DOCUSATE SODIUM 100 MG PO CAPS
100.0000 mg | ORAL_CAPSULE | Freq: Two times a day (BID) | ORAL | Status: DC
  Administered 2018-07-15 – 2018-07-17 (×4): 100 mg via ORAL
  Filled 2018-07-15 (×4): qty 1

## 2018-07-15 MED ORDER — HYDROMORPHONE HCL 1 MG/ML IJ SOLN
0.5000 mg | INTRAMUSCULAR | Status: DC | PRN
  Administered 2018-07-16 (×2): 0.5 mg via INTRAVENOUS
  Filled 2018-07-15 (×2): qty 1

## 2018-07-15 MED ORDER — ROCURONIUM BROMIDE 100 MG/10ML IV SOLN
INTRAVENOUS | Status: AC
  Filled 2018-07-15: qty 1

## 2018-07-15 MED ORDER — BUPIVACAINE LIPOSOME 1.3 % IJ SUSP
INTRAMUSCULAR | Status: DC | PRN
  Administered 2018-07-15: 20 mL

## 2018-07-15 MED ORDER — GABAPENTIN 400 MG PO CAPS
800.0000 mg | ORAL_CAPSULE | Freq: Two times a day (BID) | ORAL | Status: DC
  Administered 2018-07-15 – 2018-07-17 (×4): 800 mg via ORAL
  Filled 2018-07-15 (×4): qty 2

## 2018-07-15 MED ORDER — LISINOPRIL 20 MG PO TABS
40.0000 mg | ORAL_TABLET | Freq: Every day | ORAL | Status: DC
  Administered 2018-07-15 – 2018-07-16 (×2): 40 mg via ORAL
  Filled 2018-07-15 (×3): qty 2

## 2018-07-15 MED ORDER — CEFAZOLIN SODIUM-DEXTROSE 1-4 GM/50ML-% IV SOLN
1.0000 g | Freq: Four times a day (QID) | INTRAVENOUS | Status: AC
  Administered 2018-07-15 (×2): 1 g via INTRAVENOUS
  Filled 2018-07-15 (×2): qty 50

## 2018-07-15 MED ORDER — DEXAMETHASONE SODIUM PHOSPHATE 10 MG/ML IJ SOLN
INTRAMUSCULAR | Status: AC
  Filled 2018-07-15: qty 1

## 2018-07-15 MED ORDER — 0.9 % SODIUM CHLORIDE (POUR BTL) OPTIME
TOPICAL | Status: DC | PRN
  Administered 2018-07-15: 1000 mL

## 2018-07-15 MED ORDER — LACTATED RINGERS IV SOLN
INTRAVENOUS | Status: DC
  Administered 2018-07-15 (×2): via INTRAVENOUS

## 2018-07-15 MED ORDER — METHOCARBAMOL 500 MG IVPB - SIMPLE MED
500.0000 mg | Freq: Four times a day (QID) | INTRAVENOUS | Status: DC | PRN
  Administered 2018-07-15: 500 mg via INTRAVENOUS
  Filled 2018-07-15: qty 50

## 2018-07-15 MED ORDER — ONDANSETRON HCL 4 MG/2ML IJ SOLN: 4.0000 mg | Freq: Four times a day (QID) | INTRAMUSCULAR | Status: DC | PRN

## 2018-07-15 MED ORDER — METOCLOPRAMIDE HCL 5 MG/ML IJ SOLN: 5.0000 mg | Freq: Three times a day (TID) | INTRAMUSCULAR | Status: DC | PRN

## 2018-07-15 MED ORDER — ATENOLOL 25 MG PO TABS
25.0000 mg | ORAL_TABLET | Freq: Every evening | ORAL | Status: DC
  Administered 2018-07-15 – 2018-07-16 (×2): 25 mg via ORAL
  Filled 2018-07-15 (×3): qty 1

## 2018-07-15 MED ORDER — DEXAMETHASONE SODIUM PHOSPHATE 10 MG/ML IJ SOLN
8.0000 mg | Freq: Once | INTRAMUSCULAR | Status: AC
  Administered 2018-07-15: 8 mg via INTRAVENOUS

## 2018-07-15 MED ORDER — EPHEDRINE SULFATE-NACL 50-0.9 MG/10ML-% IV SOSY
PREFILLED_SYRINGE | INTRAVENOUS | Status: DC | PRN
  Administered 2018-07-15 (×3): 5 mg via INTRAVENOUS

## 2018-07-15 MED ORDER — PHENYLEPHRINE HCL 10 MG/ML IJ SOLN
INTRAMUSCULAR | Status: AC
  Filled 2018-07-15: qty 1

## 2018-07-15 MED ORDER — SODIUM CHLORIDE (PF) 0.9 % IJ SOLN
INTRAMUSCULAR | Status: AC
  Filled 2018-07-15: qty 20

## 2018-07-15 MED ORDER — FERROUS SULFATE 325 (65 FE) MG PO TABS
325.0000 mg | ORAL_TABLET | Freq: Three times a day (TID) | ORAL | Status: DC
  Administered 2018-07-15 – 2018-07-17 (×5): 325 mg via ORAL
  Filled 2018-07-15 (×6): qty 1

## 2018-07-15 MED ORDER — PROPOFOL 500 MG/50ML IV EMUL
INTRAVENOUS | Status: DC | PRN
  Administered 2018-07-15: 35 ug/kg/min via INTRAVENOUS

## 2018-07-15 MED ORDER — ONDANSETRON HCL 4 MG/2ML IJ SOLN
INTRAMUSCULAR | Status: AC
  Filled 2018-07-15: qty 4

## 2018-07-15 MED ORDER — SODIUM CHLORIDE 0.9 % IR SOLN
Status: DC | PRN
  Administered 2018-07-15: 1000 mL

## 2018-07-15 MED ORDER — DEXAMETHASONE SODIUM PHOSPHATE 10 MG/ML IJ SOLN
INTRAMUSCULAR | Status: AC
  Filled 2018-07-15: qty 2

## 2018-07-15 MED ORDER — SODIUM CHLORIDE 0.9 % IV SOLN
INTRAVENOUS | Status: AC
  Filled 2018-07-15: qty 500000

## 2018-07-15 MED ORDER — PHENOL 1.4 % MT LIQD: 1.0000 | OROMUCOSAL | Status: DC | PRN

## 2018-07-15 MED ORDER — METOCLOPRAMIDE HCL 5 MG PO TABS: 5.0000 mg | ORAL_TABLET | Freq: Three times a day (TID) | ORAL | Status: DC | PRN

## 2018-07-15 MED ORDER — RIVAROXABAN 10 MG PO TABS
10.0000 mg | ORAL_TABLET | Freq: Every day | ORAL | Status: DC
  Administered 2018-07-16 – 2018-07-17 (×2): 10 mg via ORAL
  Filled 2018-07-15 (×2): qty 1

## 2018-07-15 MED ORDER — MIDAZOLAM HCL 2 MG/2ML IJ SOLN
INTRAMUSCULAR | Status: AC
  Filled 2018-07-15: qty 2

## 2018-07-15 MED ORDER — POLYETHYLENE GLYCOL 3350 17 G PO PACK: 17.0000 g | PACK | Freq: Every day | ORAL | Status: DC | PRN

## 2018-07-15 MED ORDER — LACTATED RINGERS IV SOLN: INTRAVENOUS | Status: DC

## 2018-07-15 MED ORDER — OXYCODONE HCL 5 MG PO TABS: 5.0000 mg | ORAL_TABLET | Freq: Once | ORAL | Status: DC | PRN

## 2018-07-15 MED ORDER — METHOCARBAMOL 500 MG IVPB - SIMPLE MED
INTRAVENOUS | Status: AC
  Filled 2018-07-15: qty 50

## 2018-07-15 MED ORDER — METHOCARBAMOL 500 MG PO TABS
500.0000 mg | ORAL_TABLET | Freq: Four times a day (QID) | ORAL | Status: DC | PRN
  Administered 2018-07-15 – 2018-07-17 (×4): 500 mg via ORAL
  Filled 2018-07-15 (×4): qty 1

## 2018-07-15 MED ORDER — ONDANSETRON HCL 4 MG/2ML IJ SOLN
INTRAMUSCULAR | Status: DC | PRN
  Administered 2018-07-15: 4 mg via INTRAVENOUS

## 2018-07-15 MED ORDER — DIPHENHYDRAMINE HCL 25 MG PO CAPS
25.0000 mg | ORAL_CAPSULE | Freq: Four times a day (QID) | ORAL | Status: DC | PRN
  Administered 2018-07-15: 25 mg via ORAL
  Filled 2018-07-15: qty 1

## 2018-07-15 MED ORDER — EPHEDRINE 5 MG/ML INJ
INTRAVENOUS | Status: AC
  Filled 2018-07-15: qty 10

## 2018-07-15 MED ORDER — BISACODYL 5 MG PO TBEC: 5.0000 mg | DELAYED_RELEASE_TABLET | Freq: Every day | ORAL | Status: DC | PRN

## 2018-07-15 MED ORDER — TRANEXAMIC ACID-NACL 1000-0.7 MG/100ML-% IV SOLN
1000.0000 mg | INTRAVENOUS | Status: AC
  Administered 2018-07-15: 1000 mg via INTRAVENOUS
  Filled 2018-07-15: qty 100

## 2018-07-15 MED ORDER — HYDROMORPHONE HCL 2 MG PO TABS
2.0000 mg | ORAL_TABLET | ORAL | Status: DC | PRN
  Administered 2018-07-15 – 2018-07-16 (×4): 2 mg via ORAL
  Filled 2018-07-15 (×4): qty 1

## 2018-07-15 MED ORDER — SODIUM CHLORIDE 0.9 % IV SOLN
INTRAVENOUS | Status: DC | PRN
  Administered 2018-07-15: 500 mL

## 2018-07-15 MED ORDER — FLEET ENEMA 7-19 GM/118ML RE ENEM: 1.0000 | ENEMA | Freq: Once | RECTAL | Status: DC | PRN

## 2018-07-15 MED ORDER — ROPIVACAINE HCL 5 MG/ML IJ SOLN
INTRAMUSCULAR | Status: DC | PRN
  Administered 2018-07-15: 20 mL via PERINEURAL

## 2018-07-15 MED ORDER — ONDANSETRON HCL 4 MG PO TABS
4.0000 mg | ORAL_TABLET | Freq: Four times a day (QID) | ORAL | Status: DC | PRN
  Administered 2018-07-16: 4 mg via ORAL
  Filled 2018-07-15: qty 1

## 2018-07-15 MED ORDER — FENTANYL CITRATE (PF) 100 MCG/2ML IJ SOLN
25.0000 ug | INTRAMUSCULAR | Status: DC | PRN
  Administered 2018-07-15 (×2): 50 ug via INTRAVENOUS

## 2018-07-15 MED ORDER — ALUM & MAG HYDROXIDE-SIMETH 200-200-20 MG/5ML PO SUSP: 30.0000 mL | ORAL | Status: DC | PRN

## 2018-07-15 MED ORDER — BUPIVACAINE IN DEXTROSE 0.75-8.25 % IT SOLN
INTRATHECAL | Status: DC | PRN
  Administered 2018-07-15: 1.6 mL via INTRATHECAL

## 2018-07-15 MED ORDER — FENTANYL CITRATE (PF) 100 MCG/2ML IJ SOLN
INTRAMUSCULAR | Status: AC
  Filled 2018-07-15: qty 4

## 2018-07-15 MED ORDER — MENTHOL 3 MG MT LOZG: 1.0000 | LOZENGE | OROMUCOSAL | Status: DC | PRN

## 2018-07-15 MED ORDER — THROMBIN (RECOMBINANT) 5000 UNITS EX SOLR
CUTANEOUS | Status: AC
  Filled 2018-07-15: qty 5000

## 2018-07-15 MED ORDER — LIDOCAINE 2% (20 MG/ML) 5 ML SYRINGE
INTRAMUSCULAR | Status: AC
  Filled 2018-07-15: qty 5

## 2018-07-15 MED ORDER — ONDANSETRON HCL 4 MG/2ML IJ SOLN
INTRAMUSCULAR | Status: AC
  Filled 2018-07-15: qty 2

## 2018-07-15 MED ORDER — FENTANYL CITRATE (PF) 100 MCG/2ML IJ SOLN
INTRAMUSCULAR | Status: AC
  Filled 2018-07-15: qty 2

## 2018-07-15 MED ORDER — FENTANYL CITRATE (PF) 100 MCG/2ML IJ SOLN
INTRAMUSCULAR | Status: DC | PRN
  Administered 2018-07-15 (×2): 25 ug via INTRAVENOUS
  Administered 2018-07-15: 50 ug via INTRAVENOUS

## 2018-07-15 MED ORDER — ACETAMINOPHEN 500 MG PO TABS
1000.0000 mg | ORAL_TABLET | Freq: Four times a day (QID) | ORAL | Status: AC
  Administered 2018-07-15 – 2018-07-16 (×4): 1000 mg via ORAL
  Filled 2018-07-15 (×4): qty 2

## 2018-07-15 MED ORDER — STERILE WATER FOR IRRIGATION IR SOLN
Status: DC | PRN
  Administered 2018-07-15: 2000 mL

## 2018-07-15 MED ORDER — CEFAZOLIN SODIUM-DEXTROSE 2-4 GM/100ML-% IV SOLN
2.0000 g | INTRAVENOUS | Status: AC
  Administered 2018-07-15: 2 g via INTRAVENOUS
  Filled 2018-07-15: qty 100

## 2018-07-15 MED ORDER — OXYCODONE HCL 5 MG/5ML PO SOLN: 5.0000 mg | Freq: Once | ORAL | Status: DC | PRN

## 2018-07-15 MED ORDER — SODIUM CHLORIDE (PF) 0.9 % IJ SOLN
INTRAMUSCULAR | Status: DC | PRN
  Administered 2018-07-15: 20 mL

## 2018-07-15 MED ORDER — MIDAZOLAM HCL 5 MG/5ML IJ SOLN
INTRAMUSCULAR | Status: DC | PRN
  Administered 2018-07-15: 1 mg via INTRAVENOUS

## 2018-07-15 MED ORDER — CHLORHEXIDINE GLUCONATE 4 % EX LIQD: 60.0000 mL | Freq: Once | CUTANEOUS | Status: DC

## 2018-07-15 MED ORDER — PROPOFOL 10 MG/ML IV BOLUS
INTRAVENOUS | Status: AC
  Filled 2018-07-15: qty 80

## 2018-07-15 MED ORDER — DOXAZOSIN MESYLATE 1 MG PO TABS
1.0000 mg | ORAL_TABLET | Freq: Every day | ORAL | Status: DC
  Administered 2018-07-15 – 2018-07-16 (×2): 1 mg via ORAL
  Filled 2018-07-15 (×2): qty 1

## 2018-07-15 MED ORDER — PROPOFOL 10 MG/ML IV BOLUS
INTRAVENOUS | Status: DC | PRN
  Administered 2018-07-15: 10 mg via INTRAVENOUS

## 2018-07-15 SURGICAL SUPPLY — 69 items
BAG DECANTER FOR FLEXI CONT (MISCELLANEOUS) ×3 IMPLANT
BAG ZIPLOCK 12X15 (MISCELLANEOUS) IMPLANT
BANDAGE ACE 4X5 VEL STRL LF (GAUZE/BANDAGES/DRESSINGS) ×3 IMPLANT
BANDAGE ACE 6X5 VEL STRL LF (GAUZE/BANDAGES/DRESSINGS) ×3 IMPLANT
BLADE SAG 18X100X1.27 (BLADE) ×3 IMPLANT
BLADE SAW SGTL 11.0X1.19X90.0M (BLADE) ×3 IMPLANT
BLADE SURG SZ10 CARB STEEL (BLADE) ×6 IMPLANT
BNDG GAUZE ELAST 4 BULKY (GAUZE/BANDAGES/DRESSINGS) ×3 IMPLANT
CEMENT HV SMART SET (Cement) ×6 IMPLANT
CEMENT TIBIA MBT (Knees) ×1 IMPLANT
CHLORAPREP W/TINT 26ML (MISCELLANEOUS) ×3 IMPLANT
CLOSURE WOUND 1/4X4 (GAUZE/BANDAGES/DRESSINGS) ×2
COVER SURGICAL LIGHT HANDLE (MISCELLANEOUS) ×3 IMPLANT
COVER WAND RF STERILE (DRAPES) ×3 IMPLANT
CUFF TOURN SGL QUICK 34 (TOURNIQUET CUFF) ×2
CUFF TRNQT CYL 34X4.125X (TOURNIQUET CUFF) ×1 IMPLANT
DECANTER SPIKE VIAL GLASS SM (MISCELLANEOUS) ×3 IMPLANT
DRAPE INCISE IOBAN 66X45 STRL (DRAPES) IMPLANT
DRAPE U-SHAPE 47X51 STRL (DRAPES) ×3 IMPLANT
DRSG ADAPTIC 3X8 NADH LF (GAUZE/BANDAGES/DRESSINGS) ×3 IMPLANT
DRSG PAD ABDOMINAL 8X10 ST (GAUZE/BANDAGES/DRESSINGS) ×3 IMPLANT
ELECT BLADE TIP CTD 4 INCH (ELECTRODE) ×3 IMPLANT
ELECT REM PT RETURN 15FT ADLT (MISCELLANEOUS) ×3 IMPLANT
EVACUATOR 1/8 PVC DRAIN (DRAIN) ×3 IMPLANT
FACESHIELD WRAPAROUND (MASK) ×9 IMPLANT
GAUZE SPONGE 4X4 12PLY STRL (GAUZE/BANDAGES/DRESSINGS) ×3 IMPLANT
GLOVE BIOGEL PI IND STRL 6.5 (GLOVE) ×3 IMPLANT
GLOVE BIOGEL PI IND STRL 8 (GLOVE) ×1 IMPLANT
GLOVE BIOGEL PI INDICATOR 6.5 (GLOVE) ×6
GLOVE BIOGEL PI INDICATOR 8 (GLOVE) ×2
GLOVE SURG SS PI 6.0 STRL IVOR (GLOVE) ×6 IMPLANT
GLOVE SURG SS PI 6.5 STRL IVOR (GLOVE) ×3 IMPLANT
GLOVE SURG SS PI 8.0 STRL IVOR (GLOVE) ×3 IMPLANT
GOWN STRL REUS W/TWL LRG LVL3 (GOWN DISPOSABLE) ×6 IMPLANT
GOWN STRL REUS W/TWL XL LVL3 (GOWN DISPOSABLE) ×6 IMPLANT
HANDPIECE INTERPULSE COAX TIP (DISPOSABLE) ×2
HEMOSTAT SPONGE AVITENE ULTRA (HEMOSTASIS) ×3 IMPLANT
HOLDER FOLEY CATH W/STRAP (MISCELLANEOUS) IMPLANT
IMMOBILIZER KNEE 20 (SOFTGOODS) ×3
IMMOBILIZER KNEE 20 THIGH 36 (SOFTGOODS) ×1 IMPLANT
IMPL FEMUR SIGMA LT PS SZ 3 (Knees) ×1 IMPLANT
IMPLANT FEMUR SIGMA LT PS SZ 3 (Knees) ×3 IMPLANT
INSERT PFC SIG STB SZ3 15.0MM (Knees) ×3 IMPLANT
MANIFOLD NEPTUNE II (INSTRUMENTS) ×3 IMPLANT
PACK TOTAL KNEE CUSTOM (KITS) ×3 IMPLANT
PADDING CAST COTTON 6X4 STRL (CAST SUPPLIES) ×3 IMPLANT
PATELLA DOME PFC 35MM (Knees) ×3 IMPLANT
PENCIL SMOKE EVAC W/HOLSTER (ELECTROSURGICAL) ×3 IMPLANT
PIN STEINMAN FIXATION KNEE (PIN) ×3 IMPLANT
PROTECTOR NERVE ULNAR (MISCELLANEOUS) ×3 IMPLANT
SET HNDPC FAN SPRY TIP SCT (DISPOSABLE) ×1 IMPLANT
SET PAD KNEE POSITIONER (MISCELLANEOUS) ×3 IMPLANT
SLEEVE SUCTION 125 (MISCELLANEOUS) ×3 IMPLANT
SPONGE LAP 18X18 RF (DISPOSABLE) IMPLANT
STRIP CLOSURE SKIN 1/4X4 (GAUZE/BANDAGES/DRESSINGS) ×4 IMPLANT
SUT BONE WAX W31G (SUTURE) IMPLANT
SUT MNCRL AB 4-0 PS2 18 (SUTURE) ×3 IMPLANT
SUT STRATAFIX 0 PDS 27 VIOLET (SUTURE) ×3
SUT VIC AB 1 CT1 27 (SUTURE) ×2
SUT VIC AB 1 CT1 27XBRD ANTBC (SUTURE) ×1 IMPLANT
SUT VIC AB 2-0 CT1 27 (SUTURE) ×6
SUT VIC AB 2-0 CT1 TAPERPNT 27 (SUTURE) ×3 IMPLANT
SUTURE STRATFX 0 PDS 27 VIOLET (SUTURE) ×1 IMPLANT
SYR 20CC LL (SYRINGE) ×6 IMPLANT
TIBIA MBT CEMENT (Knees) ×3 IMPLANT
TOWER CARTRIDGE SMART MIX (DISPOSABLE) ×3 IMPLANT
TRAY FOLEY SLVR 16FR LF STAT (SET/KITS/TRAYS/PACK) ×3 IMPLANT
WRAP KNEE MAXI GEL POST OP (GAUZE/BANDAGES/DRESSINGS) ×3 IMPLANT
YANKAUER SUCT BULB TIP 10FT TU (MISCELLANEOUS) ×3 IMPLANT

## 2018-07-15 NOTE — Anesthesia Procedure Notes (Addendum)
Spinal  Patient location during procedure: OR Start time: 07/15/2018 7:35 AM End time: 07/15/2018 7:39 AM Preanesthetic Checklist Completed: patient identified, site marked, surgical consent, pre-op evaluation, timeout performed, IV checked, risks and benefits discussed and monitors and equipment checked Spinal Block Patient position: sitting Prep: DuraPrep Patient monitoring: heart rate, cardiac monitor, continuous pulse ox and blood pressure Approach: midline Location: L3-4 Injection technique: single-shot Needle Needle type: Sprotte  Needle gauge: 22 G Needle length: 9 cm Assessment Sensory level: T4

## 2018-07-15 NOTE — Evaluation (Signed)
Physical Therapy Evaluation Patient Details Name: Elizabeth Mullins MRN: [id number] DOB: Oct 09, 1931 Today's Date: 07/15/2018   History of Present Illness  85 yo female s/p L TKR on 07/15/18. PMH includes hernia repair 2018, TIA, cervical pain with radiculopathy, anxiety, IBS, chronic back pain, cochlear hearing loss bilaterally, scoliosis, R TKR 2002.   Clinical Impression   Pt presents with L knee pain, decreased L knee ROM, difficulty performing bed mobility/transfers, and decreased activity tolerance due to L knee pain. Pt to benefit from acute PT to address deficits. Pt ambulated 60 ft with RW with min guard assist, verbal cuing for form and safety provided throughout. Pt educated on ankle pumps (20/hour) to perform this afternoon/evening to increase circulation, to pt's tolerance and limited by pain. PT to progress mobility as tolerated, and will continue to follow acutely.        Follow Up Recommendations Follow surgeon's recommendation for DC plan and follow-up therapies;Supervision for mobility/OOB(HHPT-->OPPT)    Equipment Recommendations  Rolling walker with 5" wheels;3in1 (PT)(youth RW needed, pt is 4'11")    Recommendations for Other Services       Precautions / Restrictions Precautions Precautions: Fall Restrictions Weight Bearing Restrictions: No Other Position/Activity Restrictions: WBAT       Mobility  Bed Mobility Overal bed mobility: Needs Assistance Bed Mobility: Supine to Sit     Supine to sit: Min assist;HOB elevated     General bed mobility comments: Min assist for LLE lifting and translation to EOB. Increased time to scoot to EOB, vocalizing with pain upon initially flexing L knee at EOB.   Transfers Overall transfer level: Needs assistance Equipment used: Rolling walker (2 wheeled) Transfers: Sit to/from Stand Sit to Stand: Min guard;From elevated surface         General transfer comment: Min assist for steadying upon standing. Pt with no L knee  instability on WB. VC for placement of UEs to rise. Pt reporting dizziness upon initial standing, improved with time.   Ambulation/Gait Ambulation/Gait assistance: Min guard Gait Distance (Feet): 60 Feet Assistive device: Rolling walker (2 wheeled) Gait Pattern/deviations: Step-to pattern;Step-through pattern;Decreased stride length;Decreased stance time - left;Decreased weight shift to left;Antalgic;Trunk flexed Gait velocity: decr    General Gait Details: Min guard for safety. No evidence of LLE instability during stance phase of gait. Verbal cuing for sequencing with step-to gait although pt quickly progressing to step-through gait, placement in RW, upright truncal posture.   Stairs            Wheelchair Mobility    Modified Rankin (Stroke Patients Only)       Balance Overall balance assessment: Mild deficits observed, not formally tested                                           Pertinent Vitals/Pain Pain Assessment: 0-10 Pain Score: 5  Pain Location: L knee  Pain Descriptors / Indicators: Sore Pain Intervention(s): Limited activity within patient's tolerance;Repositioned;Monitored during session;Premedicated before session    Home Living Family/patient expects to be discharged to:: Private residence Living Arrangements: Children Available Help at Discharge: Family;Available 24 hours/day Type of Home: House Home Access: Stairs to enter Entrance Stairs-Rails: None Entrance Stairs-Number of Steps: 1, multiple 1-step sets inside home to get to different rooms  Home Layout: One level Home Equipment: Cane - single point      Prior Function Level of Independence: Independent with  assistive device(s)         Comments: Pt reports using cane for mobility as needed, and should have been using it more. Pt independent with ADLs per her and her daughter's report      Hand Dominance   Dominant Hand: Right    Extremity/Trunk Assessment   Upper  Extremity Assessment Upper Extremity Assessment: Overall WFL for tasks assessed    Lower Extremity Assessment Lower Extremity Assessment: Overall WFL for tasks assessed;LLE deficits/detail LLE Deficits / Details: suspected post-surgical weakness; able to perform ankle pumps, quad sets, SLR without lift assist and with <10* quad lag  LLE Sensation: WNL    Cervical / Trunk Assessment Cervical / Trunk Assessment: Kyphotic;Other exceptions Cervical / Trunk Exceptions: scoliosis  Communication   Communication: HOH  Cognition Arousal/Alertness: Awake/alert Behavior During Therapy: WFL for tasks assessed/performed Overall Cognitive Status: Within Functional Limits for tasks assessed                                        General Comments      Exercises Total Joint Exercises Goniometric ROM: knee AROM 5-50*, limited by pain and stiffness   Assessment/Plan    PT Assessment Patient needs continued PT services  PT Problem List Decreased strength;Pain;Decreased range of motion;Decreased activity tolerance;Decreased knowledge of use of DME;Decreased balance;Decreased mobility       PT Treatment Interventions DME instruction;Therapeutic activities;Gait training;Therapeutic exercise;Patient/family education;Balance training;Stair training;Functional mobility training    PT Goals (Current goals can be found in the Care Plan section)  Acute Rehab PT Goals Patient Stated Goal: none stated  PT Goal Formulation: With patient Time For Goal Achievement: 07/22/18 Potential to Achieve Goals: Good    Frequency 7X/week   Barriers to discharge        Co-evaluation               AM-PAC PT "6 Clicks" Mobility  Outcome Measure Help needed turning from your back to your side while in a flat bed without using bedrails?: A Little Help needed moving from lying on your back to sitting on the side of a flat bed without using bedrails?: A Little Help needed moving to and from  a bed to a chair (including a wheelchair)?: A Little Help needed standing up from a chair using your arms (e.g., wheelchair or bedside chair)?: A Little Help needed to walk in hospital room?: A Little Help needed climbing 3-5 steps with a railing? : A Little 6 Click Score: 18    End of Session Equipment Utilized During Treatment: Gait belt;Oxygen(O2 reapplied after session ) Activity Tolerance: Patient tolerated treatment well;Patient limited by pain Patient left: in chair;with chair alarm set;with call bell/phone within reach;with family/visitor present;with SCD's reapplied Nurse Communication: Mobility status PT Visit Diagnosis: Other abnormalities of gait and mobility (R26.89);Difficulty in walking, not elsewhere classified (R26.2)    Time: 5631-4970 PT Time Calculation (min) (ACUTE ONLY): 28 min   Charges:   PT Evaluation $PT Eval Low Complexity: 1 Low PT Treatments $Gait Training: 8-22 mins       Julien Girt, PT Acute Rehabilitation Services Pager [phone]  Office [phone]  Abcde Oneil D Elonda Husky 07/15/2018, 4:15 PM

## 2018-07-15 NOTE — Anesthesia Postprocedure Evaluation (Signed)
Anesthesia Post Note  Patient: Elizabeth Mullins  Procedure(Mullins) Performed: TOTAL KNEE ARTHROPLASTY (Left Knee)     Patient location during evaluation: PACU Anesthesia Type: Spinal and Regional Level of consciousness: oriented and awake and alert Pain management: pain level controlled Vital Signs Assessment: post-procedure vital signs reviewed and stable Respiratory status: spontaneous breathing, respiratory function stable and patient connected to nasal cannula oxygen Cardiovascular status: blood pressure returned to baseline and stable Postop Assessment: no headache, no backache and no apparent nausea or vomiting Anesthetic complications: no    Last Vitals:  Vitals:   07/15/18 1255 07/15/18 1350  BP: 131/70 129/73  Pulse: 85 82  Resp: 15 15  Temp: 36.5 C 36.8 C  SpO2: 100% 98%    Last Pain:  Vitals:   07/15/18 1350  TempSrc: Oral  PainSc:                  Elizabeth Mullins

## 2018-07-15 NOTE — Transfer of Care (Signed)
Immediate Anesthesia Transfer of Care Note  Patient: Elizabeth Mullins  Procedure(s) Performed: Procedure(s) with comments: TOTAL KNEE ARTHROPLASTY (Left) - 195min  Patient Location: PACU  Anesthesia Type:Spinal  Level of Consciousness:  sedated, patient cooperative and responds to stimulation  Airway & Oxygen Therapy:Patient Spontanous Breathing and Patient connected to face mask oxgen  Post-op Assessment:  Report given to PACU RN and Post -op Vital signs reviewed and stable  Post vital signs:  Reviewed and stable  Last Vitals:  Vitals:   07/15/18 0540  BP: 117/72  Pulse: 67  Resp: 16  Temp: 36.7 C  SpO2: 70%    Complications: No apparent anesthesia complications

## 2018-07-15 NOTE — Brief Op Note (Signed)
07/15/2018  9:23 AM  PATIENT:  Lorenza Burton  85 y.o. female  PRE-OPERATIVE DIAGNOSIS:  left knee osteoarthritis,Primary.with Flexion Contracture.  POST-OPERATIVE DIAGNOSIS: Same as Pre-Op  PROCEDURE:  Procedure(s) with comments: TOTAL KNEE ARTHROPLASTY (Left) - 151min and release of contractures.  SURGEON:  Surgeon(s) and Role:    * Latanya Maudlin, MD - Primary  PHYSICIAN ASSISTANT: Ardeen Jourdain PA  ASSISTANTS: Ardeen Jourdain PA  ANESTHESIA:   spinal and Femoral Nerve Block on the Left  EBL:  75 mL   BLOOD ADMINISTERED:none  DRAINS: (One) Hemovact drain(s) in the Left Knee with  Suction Open   LOCAL MEDICATIONS USED:  OTHER 20cc of Exparel at the end of the case.  SPECIMEN:  No Specimen  DISPOSITION OF SPECIMEN:  N/A  COUNTS:  YES  TOURNIQUET:   Total Tourniquet Time Documented: Thigh (Left) - 68 minutes Total: Thigh (Left) - 68 minutes   DICTATION: .Other Dictation: Dictation Number  [id number]  PLAN OF CARE: Admit to inpatient   PATIENT DISPOSITION:  Stable in OR   Delay start of Pharmacological VTE agent (>24hrs) due to surgical blood loss or risk of bleeding: yes

## 2018-07-15 NOTE — Anesthesia Preprocedure Evaluation (Signed)
Anesthesia Evaluation  Patient identified by MRN, date of birth, ID band Patient awake    Reviewed: Allergy & Precautions, H&P , NPO status , Patient's Chart, lab work & pertinent test results  Airway Mallampati: II   Neck ROM: full    Dental   Pulmonary neg pulmonary ROS,    breath sounds clear to auscultation       Cardiovascular hypertension,  Rhythm:regular Rate:Normal     Neuro/Psych  Headaches, PSYCHIATRIC DISORDERS Anxiety TIA Neuromuscular disease    GI/Hepatic   Endo/Other    Renal/GU      Musculoskeletal  (+) Arthritis ,   Abdominal   Peds  Hematology   Anesthesia Other Findings   Reproductive/Obstetrics                             Anesthesia Physical Anesthesia Plan  ASA: II  Anesthesia Plan: Spinal   Post-op Pain Management:  Regional for Post-op pain   Induction: Intravenous  PONV Risk Score and Plan: 2 and Ondansetron, Propofol infusion and Treatment may vary due to age or medical condition  Airway Management Planned: Simple Face Mask  Additional Equipment:   Intra-op Plan:   Post-operative Plan:   Informed Consent: I have reviewed the patients History and Physical, chart, labs and discussed the procedure including the risks, benefits and alternatives for the proposed anesthesia with the patient or authorized representative who has indicated his/her understanding and acceptance.       Plan Discussed with: CRNA, Anesthesiologist and Surgeon  Anesthesia Plan Comments:         Anesthesia Quick Evaluation

## 2018-07-15 NOTE — Discharge Instructions (Signed)
° °Dr. Ronald Gioffre °[facility] °[address] °[place], [place] [address] °[phone] ° °TOTAL KNEE REPLACEMENT POSTOPERATIVE DIRECTIONS ° °Knee Rehabilitation, Guidelines Following Surgery  °Results after knee surgery are often greatly improved when you follow the exercise, range of motion and muscle strengthening exercises prescribed by your doctor. Safety measures are also important to protect the knee from further injury. Any time any of these exercises cause you to have increased pain or swelling in your knee joint, decrease the amount until you are comfortable again and slowly increase them. If you have problems or questions, call your caregiver or physical therapist for advice.  ° °HOME CARE INSTRUCTIONS  °Remove items at home which could result in a fall. This includes throw rugs or furniture in walking pathways.  °· ICE to the affected knee every three hours for 30 minutes at a time and then as needed for pain and swelling.  Continue to use ice on the knee for pain and swelling from surgery. You may notice swelling that will progress down to the foot and ankle.  This is normal after surgery.  Elevate the leg when you are not up walking on it.   °· Continue to use the breathing machine which will help keep your temperature down.  It is common for your temperature to cycle up and down following surgery, especially at night when you are not up moving around and exerting yourself.  The breathing machine keeps your lungs expanded and your temperature down. °· Do not place pillow under knee, focus on keeping the knee straight while resting ° °DIET °You may resume your previous home diet once your are discharged from the hospital. ° °DRESSING / WOUND CARE / SHOWERING °You may shower 3 days after surgery, but keep the wounds dry during showering.  You may use an occlusive plastic wrap (Press'n Seal for example), NO SOAKING/SUBMERGING IN THE BATHTUB.  If the bandage gets wet, change with a  clean dry gauze.  If the incision gets wet, pat the wound dry with a clean towel. °You may start showering once you are discharged home but do not submerge the incision under water. Just pat the incision dry and apply a dry gauze dressing on daily. °Change the surgical dressing daily and reapply a dry dressing each time. ° °ACTIVITY °Walk with your walker as instructed. °Use walker as long as suggested by your caregivers. °Avoid periods of inactivity such as sitting longer than an hour when not asleep. This helps prevent blood clots.  °You may resume a sexual relationship in one month or when given the OK by your doctor.  °You may return to work once you are cleared by your doctor.  °Do not drive a car for 6 weeks or until released by you surgeon.  °Do not drive while taking narcotics. ° °WEIGHT BEARING °Weight bearing as tolerated with assist device (walker, cane, etc) as directed, use it as long as suggested by your surgeon or therapist, typically at least 4-6 weeks. ° °POSTOPERATIVE CONSTIPATION PROTOCOL °Constipation - defined medically as fewer than three stools per week and severe constipation as less than one stool per week. ° °One of the most common issues patients have following surgery is constipation.  Even if you have a regular bowel pattern at home, your normal regimen is likely to be disrupted due to multiple reasons following surgery.  Combination of anesthesia, postoperative narcotics, change in appetite and fluid intake all can affect your bowels.  In order to   avoid complications following surgery, here are some recommendations in order to help you during your recovery period. ° °Colace (docusate) - Pick up an over-the-counter form of Colace or another stool softener and take twice a day as long as you are requiring postoperative pain medications.  Take with a full glass of water daily.  If you experience loose stools or diarrhea, hold the colace until you stool forms back up.  If your symptoms do  not get better within 1 week or if they get worse, check with your doctor. ° °Dulcolax (bisacodyl) - Pick up over-the-counter and take as directed by the product packaging as needed to assist with the movement of your bowels.  Take with a full glass of water.  Use this product as needed if not relieved by Colace only.  ° °MiraLax (polyethylene glycol) - Pick up over-the-counter to have on hand.  MiraLax is a solution that will increase the amount of water in your bowels to assist with bowel movements.  Take as directed and can mix with a glass of water, juice, soda, coffee, or tea.  Take if you go more than two days without a movement. °Do not use MiraLax more than once per day. Call your doctor if you are still constipated or irregular after using this medication for 7 days in a row. ° °If you continue to have problems with postoperative constipation, please contact the office for further assistance and recommendations.  If you experience "the worst abdominal pain ever" or develop nausea or vomiting, please contact the office immediatly for further recommendations for treatment. ° °ITCHING ° If you experience itching with your medications, try taking only a single pain pill, or even half a pain pill at a time.  You can also use Benadryl over the counter for itching or also to help with sleep.  ° °TED HOSE STOCKINGS °Wear the elastic stockings on both legs for three weeks following surgery during the day but you may remove then at night for sleeping. ° °MEDICATIONS °See your medication summary on the “After Visit Summary” that the nursing staff will review with you prior to discharge.  You may have some home medications which will be placed on hold until you complete the course of blood thinner medication.  It is important for you to complete the blood thinner medication as prescribed by your surgeon.  Continue your approved medications as instructed at time of discharge. ° °PRECAUTIONS °If you experience chest pain  or shortness of breath - call 911 immediately for transfer to the hospital emergency department.  °If you develop a fever greater that 101 F, purulent drainage from wound, increased redness or drainage from wound, foul odor from the wound/dressing, or calf pain - CONTACT YOUR SURGEON.   °                                                °FOLLOW-UP APPOINTMENTS °Make sure you keep all of your appointments after your operation with your surgeon and caregivers. You should call the office at the above phone number and make an appointment for approximately two weeks after the date of your surgery or on the date instructed by your surgeon outlined in the "After Visit Summary". ° ° °RANGE OF MOTION AND STRENGTHENING EXERCISES  °Rehabilitation of the knee is important following a knee injury or an operation. After   just a few days of immobilization, the muscles of the thigh which control the knee become weakened and shrink (atrophy). Knee exercises are designed to build up the tone and strength of the thigh muscles and to improve knee motion. Often times heat used for twenty to thirty minutes before working out will loosen up your tissues and help with improving the range of motion but do not use heat for the first two weeks following surgery. These exercises can be done on a training (exercise) mat, on the floor, on a table or on a bed. Use what ever works the best and is most comfortable for you Knee exercises include:  °Leg Lifts - While your knee is still immobilized in a splint or cast, you can do straight leg raises. Lift the leg to 60 degrees, hold for 3 sec, and slowly lower the leg. Repeat 10-20 times 2-3 times daily. Perform this exercise against resistance later as your knee gets better.  °Quad and Hamstring Sets - Tighten up the muscle on the front of the thigh (Quad) and hold for 5-10 sec. Repeat this 10-20 times hourly. Hamstring sets are done by pushing the foot backward against an object and holding for 5-10  sec. Repeat as with quad sets.  °· Leg Slides: Lying on your back, slowly slide your foot toward your buttocks, bending your knee up off the floor (only go as far as is comfortable). Then slowly slide your foot back down until your leg is flat on the floor again. °· Angel Wings: Lying on your back spread your legs to the side as far apart as you can without causing discomfort.  °A rehabilitation program following serious knee injuries can speed recovery and prevent re-injury in the future due to weakened muscles. Contact your doctor or a physical therapist for more information on knee rehabilitation.  ° °IF YOU ARE TRANSFERRED TO A SKILLED REHAB FACILITY °If the patient is transferred to a skilled rehab facility following release from the hospital, a list of the current medications will be sent to the facility for the patient to continue.  When discharged from the skilled rehab facility, please have the facility set up the patient's Home Health Physical Therapy prior to being released. Also, the skilled facility will be responsible for providing the patient with their medications at time of release from the facility to include their pain medication, the muscle relaxants, and their blood thinner medication. If the patient is still at the rehab facility at time of the two week follow up appointment, the skilled rehab facility will also need to assist the patient in arranging follow up appointment in our office and any transportation needs. ° °MAKE SURE YOU:  °Understand these instructions.  °Get help right away if you are not doing well or get worse.  ° ° °Pick up stool softner and laxative for home use following surgery while on pain medications. °Do not submerge incision under water. °Please use good hand washing techniques while changing dressing each day. °May shower starting three days after surgery. °Please use a clean towel to pat the incision dry following showers. °Continue to use ice for pain and swelling  after surgery. °Do not use any lotions or creams on the incision until instructed by your surgeon. °

## 2018-07-15 NOTE — Op Note (Signed)
NAME: Elizabeth Mullins, Elizabeth Mullins. MEDICAL RECORD OE:[id number] ACCOUNT [id number] DATE OF BIRTH:06-Nov-1931 FACILITY: [facility] LOCATION: [facility] PHYSICIAN:Deaundra Kutzer Fransico Setters, MD  OPERATIVE REPORT  DATE OF PROCEDURE:  07/15/2018  SURGEON:  Latanya Maudlin, MD  ASSISTANT:  Ardeen Jourdain PA.  PREOPERATIVE DIAGNOSIS:  Bone-on-bone primary osteoarthritis of the left knee with a flexion contracture.  POSTOPERATIVE DIAGNOSIS:  Bone-on-bone primary osteoarthritis of the left knee with a flexion contracture.  PROCEDURE:  Left total knee replacement utilizing the DePuy system.  All 3 components were cemented.  DESCRIPTION OF PROCEDURE:  Under spinal anesthesia, the routine orthopedic prepping and draping of the left lower extremity was carried out.  The patient also had a femoral nerve block on the left by Anesthesia prior to surgery.  I also marked the  appropriate left leg in the holding area.  At this time, after the timeout, the routine tourniquet use was at 325 mmHg.  We first exsanguinated the left lower extremity and then elevated the tourniquet.  The leg then was placed in the [facility] knee  holder.  Following that, an incision was made over the anterior aspect of the knee, with the knee flexed.  Bleeders identified and cauterized.  Self-retaining retractors were inserted.  I then carried out a median parapatellar incision in the usual  fashion.  The patella was reflected laterally.  The knee was flexed and I did medial and lateral meniscectomies and excised the anterior and posterior cruciate ligaments.  At this time, attention was directed to the distal femur.  We first made a drill  hole in the usual fashion.  We inserted a guide rod up the femur.  We then removed 12 mm thickness off the distal femur utilizing the appropriate guide.  Following that, we measured the femur to be a size 3.  We did anterior, posterior chamfering cuts  for a size 3 femoral component.  Following that, we then directed our  attention to the tibial plateau.  We initially measured it to be a size 3.  We then utilized our external guide and removed 4 mm thickness off the affected medial side as our guide and  our cut was made entirely across the plateau.  We then went back and refreshed the cut.  We then inserted our lamina spreaders.  There were no spurs.  We continued our meniscectomy posteriorly.  We then inserted our spacer block.  We had good stability  with a size 15.  At this particular time, we then continued to prepare the tibia.  We cut our notch cut out in the usual fashion.  We then did our distal femoral cut in the usual fashion for a size 3.  The trial components were inserted.  We had  excellent flexion, excellent extension and good stability medially and laterally.  At that time with the trial components in place, I did a resurfacing procedure on the patella for a size 35 patella.  Three drill holes were made in the articular surface  of the patella.  We then removed all trial components, thoroughly water picked out the knee, dried the knee out and cemented all 3 components in simultaneously and then removed all loose pieces of cement.  We had the 15 mm thickness insert and had a good  fitting at this time.  We then inserted our permanent rotating platform size 3, 15 mm thickness.  We reduced the knee and had excellent function.  All 3 components were inserted as I mentioned.  They were cemented at  the same time.  After the patella  was inserted and the knee was irrigated out, I then inserted a Hemovac drain and closed the knee in layers in the usual fashion.  So, the size used was a size 3 tibial tray.  The insert was a size 3, 15 mm thickness.  The femur was a size 3.  Patella was  a size 35.  Sterile dressings were applied.    The patient will be admitted to the hospital for pain control.    As I mentioned, she did have a spinal as well as a left femoral nerve block.  AN/NUANCE  D:07/15/2018 T:07/15/2018  JOB:[id number]

## 2018-07-15 NOTE — Interval H&P Note (Signed)
History and Physical Interval Note:  07/15/2018 7:25 AM  Elizabeth Mullins  has presented today for surgery, with the diagnosis of left knee osteoarthritis  The various methods of treatment have been discussed with the patient and family. After consideration of risks, benefits and other options for treatment, the patient has consented to  Procedure(s) with comments: TOTAL KNEE ARTHROPLASTY (Left) - 170min as a surgical intervention .  The patient's history has been reviewed, patient examined, no change in status, stable for surgery.  I have reviewed the patient's chart and labs.  Questions were answered to the patient's satisfaction.     Latanya Maudlin

## 2018-07-15 NOTE — Anesthesia Procedure Notes (Signed)
Anesthesia Regional Block: Adductor canal block   Pre-Anesthetic Checklist: ,, timeout performed, Correct Patient, Correct Site, Correct Laterality, Correct Procedure, Correct Position, site marked, Risks and benefits discussed,  Surgical consent,  Pre-op evaluation,  At surgeon's request and post-op pain management  Laterality: Left  Prep: chloraprep       Needles:  Injection technique: Single-shot  Needle Type: Echogenic Needle     Needle Length: 9cm  Needle Gauge: 21     Additional Needles:   Narrative:  Start time: 07/15/2018 7:20 AM End time: 07/15/2018 7:25 AM Injection made incrementally with aspirations every 5 mL.  Performed by: Personally  Anesthesiologist: Albertha Ghee, MD  Additional Notes: Pt tolerated the procedure well.

## 2018-07-15 NOTE — Plan of Care (Signed)

## 2018-07-16 ENCOUNTER — Encounter (HOSPITAL_COMMUNITY): Payer: Self-pay | Admitting: Orthopedic Surgery

## 2018-07-16 LAB — CBC
HEMATOCRIT: 34.2 % — AB (ref 36.0–46.0)
HEMOGLOBIN: 10.6 g/dL — AB (ref 12.0–15.0)
MCH: 31 pg (ref 26.0–34.0)
MCHC: 31 g/dL (ref 30.0–36.0)
MCV: 100 fL (ref 80.0–100.0)
Platelets: 166 10*3/uL (ref 150–400)
RBC: 3.42 MIL/uL — ABNORMAL LOW (ref 3.87–5.11)
RDW: 12.9 % (ref 11.5–15.5)
WBC: 5.9 10*3/uL (ref 4.0–10.5)
nRBC: 0 % (ref 0.0–0.2)

## 2018-07-16 LAB — BASIC METABOLIC PANEL
Anion gap: 5 (ref 5–15)
BUN: 18 mg/dL (ref 8–23)
CO2: 30 mmol/L (ref 22–32)
Calcium: 9 mg/dL (ref 8.9–10.3)
Chloride: 103 mmol/L (ref 98–111)
Creatinine, Ser: 0.76 mg/dL (ref 0.44–1.00)
GFR calc Af Amer: >60 mL/min (ref >60)
GFR calc non Af Amer: >60 mL/min (ref >60)
Glucose, Bld: 96 mg/dL (ref 70–99)
Potassium: 4.5 mmol/L (ref 3.5–5.1)
Sodium: 138 mmol/L (ref 135–145)

## 2018-07-16 MED ORDER — ASPIRIN EC 325 MG PO TBEC: 325.0000 mg | DELAYED_RELEASE_TABLET | Freq: Two times a day (BID) | ORAL | 0 refills | Status: AC

## 2018-07-16 MED ORDER — OXYCODONE HCL 5 MG PO TABS
5.0000 mg | ORAL_TABLET | ORAL | Status: DC | PRN
  Administered 2018-07-16 (×2): 5 mg via ORAL
  Administered 2018-07-16 – 2018-07-17 (×4): 10 mg via ORAL
  Filled 2018-07-16 (×2): qty 1
  Filled 2018-07-16 (×4): qty 2

## 2018-07-16 MED ORDER — METHOCARBAMOL 500 MG PO TABS: 500.0000 mg | ORAL_TABLET | Freq: Four times a day (QID) | ORAL | 0 refills | Status: AC | PRN

## 2018-07-16 MED ORDER — OXYCODONE HCL 5 MG PO TABS: 5.0000 mg | ORAL_TABLET | Freq: Four times a day (QID) | ORAL | 0 refills | Status: AC | PRN

## 2018-07-16 MED ORDER — ZOLPIDEM TARTRATE 5 MG PO TABS: 5.0000 mg | ORAL_TABLET | Freq: Every evening | ORAL | Status: DC | PRN

## 2018-07-16 NOTE — Progress Notes (Addendum)
Physical Therapy Treatment Patient Details Name: Elizabeth Mullins MRN: [id number] DOB: 08-26-1931 Today's Date: 07/16/2018    History of Present Illness 85 yo female s/p L TKR on 07/15/18. PMH includes hernia repair 2018, TIA, cervical pain with radiculopathy, anxiety, IBS, chronic back pain, cochlear hearing loss bilaterally, scoliosis, R TKR 2002.     PT Comments    Patient clearly is uncomfortable, left leg with noted frequent spasms. Patient provided with a neutral warmth blanket wrapped around the left leg and positioned with roll against leg. Suggested maybe turn off TV and paly soothing background sounds. Daughter will try this. Continue PT as tolerated.  Follow Up Recommendations  Follow surgeon's recommendation for DC plan and follow-up therapies;Supervision for mobility/OOB;PA note today states HHPT- agree is optimal for this patient.     Equipment Recommendations  Rolling walker with 5" wheels;3in1 (PT)(youth)    Recommendations for Other Services       Precautions / Restrictions Precautions Precautions: Fall;Knee Restrictions Other Position/Activity Restrictions: WBAT     Mobility  Bed Mobility               General bed mobility comments: NT due to complaints of severe spasms, noted that the leg is jumping a lot, patient very restless.  Transfers                 General transfer comment: unable, daughter reports that patient did ambulate to BR earlier.  Ambulation/Gait             General Gait Details: NT   Stairs             Wheelchair Mobility    Modified Rankin (Stroke Patients Only)       Balance                                            Cognition Arousal/Alertness: Awake/alert Behavior During Therapy: Restless;Anxious Overall Cognitive Status: Impaired/Different from baseline Area of Impairment: Memory;Awareness                               General Comments: daughter reports meds have  affected MS somewhat, needs reminding of situation      Exercises      General Comments        Pertinent Vitals/Pain Pain Score: 10-Worst pain ever Pain Location: L knee , lower leg Pain Descriptors / Indicators: Spasm;Grimacing;Crying;Moaning;Aching;Constant;Contraction Pain Intervention(s): Limited activity within patient's tolerance;Premedicated before session(placed neutral warmth around left leg for spasms)    Home Living                      Prior Function            PT Goals (current goals can now be found in the care plan section) Progress towards PT goals: Not progressing toward goals - comment(having severe spasming left leg with escalated pain)    Frequency    7X/week      PT Plan Current plan remains appropriate    Co-evaluation              AM-PAC PT "6 Clicks" Mobility   Outcome Measure  Help needed turning from your back to your side while in a flat bed without using bedrails?: Total Help needed moving from lying on your back to sitting on  the side of a flat bed without using bedrails?: Total Help needed moving to and from a bed to a chair (including a wheelchair)?: Total Help needed standing up from a chair using your arms (e.g., wheelchair or bedside chair)?: Total Help needed to walk in hospital room?: Total Help needed climbing 3-5 steps with a railing? : Total 6 Click Score: 6    End of Session   Activity Tolerance: Patient limited by pain;Treatment limited secondary to medical complications (Comment)(left leg spasming) Patient left: in bed;with call bell/phone within reach;with family/visitor present Nurse Communication: Mobility status PT Visit Diagnosis: Other abnormalities of gait and mobility (R26.89);Difficulty in walking, not elsewhere classified (R26.2)     Time: 0141-0301 PT Time Calculation (min) (ACUTE ONLY): 15 min  Charges:  $Self Care/Home Management: [place] Pager [phone] Office [phone]    Claretha Cooper 07/16/2018, 1:28 PM

## 2018-07-16 NOTE — Plan of Care (Signed)
Pt alert and oriented, c/o constant pain.  Pain med switched to oxy 5-10mg .  Pt states it gets better for a while, then has spasms in leg.  Robaxin PRN ordered and given as well.  RN will continue to monitor and medicate as needed.

## 2018-07-16 NOTE — Progress Notes (Signed)
Physical Therapy Treatment Patient Details Name: Elizabeth Mullins MRN: [id number] DOB: [birth year] Today's Date: 07/16/2018    History of Present Illness 85 yo female s/p L TKR on 07/15/18. PMH includes hernia repair 2018, TIA, cervical pain with radiculopathy, anxiety, IBS, chronic back pain, cochlear hearing loss bilaterally, scoliosis, R TKR 2002.     PT Comments    The patient is confused as to time and place at this visit. Patient is less anxious and reports left knee pain is better. Assisted with mod/max assist to [facility], decreased WB on the left leg. Will place KI on in AM and attempt gait training. Patient definitely not near safe mobility for Dc. Patient ambulated x 60' yesterday and MS was clear. Continue PT until deemed safe for Dc with daughter assisting.   daughter not in room.  Follow Up Recommendations  Follow surgeon's recommendation for DC plan and follow-up therapies;Supervision for mobility/OOB;Home health PT     Equipment Recommendations  Youth Rolling walker with 5" wheels;3in1 (PT)    Recommendations for Other Services       Precautions / Restrictions Precautions Precautions: Fall;Knee Required Braces or Orthoses: Knee Immobilizer - Left Knee Immobilizer - Left: Discontinue once straight leg raise with < 10 degree lag Restrictions Other Position/Activity Restrictions: WBAT     Mobility  Bed Mobility   Bed Mobility: Supine to Sit;Sit to Supine     Supine to sit: Mod assist Sit to supine: Mod assist   General bed mobility comments: constant support of left leg off and onto the bed.  Transfers Overall transfer level: Needs assistance Equipment used: Rolling walker (2 wheeled) Transfers: Sit to/from [facility] Sit to Stand: Mod max assist Stand pivot transfers: Mod /max assist       General transfer comment: much assistance to stand and transfer to [facility], multimodal cues for safety. Very little WB on the  left leg, knee remains flexed.  patient required assistance to steady and maintain balance while standing.  Ambulation/Gait             General Gait Details: NT   Stairs             Wheelchair Mobility    Modified Rankin (Stroke Patients Only)       Balance Overall balance assessment: Needs assistance Sitting-balance support: Bilateral upper extremity supported;Feet supported Sitting balance-Leahy Scale: Good     Standing balance support: During functional activity;Bilateral upper extremity supported Standing balance-Leahy Scale: Poor                              Cognition Arousal/Alertness: Awake/alert Behavior During Therapy: Restless;Anxious Overall Cognitive Status: Impaired/Different from baseline Area of Impairment: Memory;Awareness;Attention;Following commands;Safety/judgement                   Current Attention Level: Selective Memory: Decreased recall of precautions;Decreased short-term memory Following Commands: Follows one step commands inconsistently;Follows one step commands with increased time Safety/Judgement: Decreased awareness of safety;Decreased awareness of deficits     General Comments: patient is not oriented to place nor time and is somewhat aware  of having surgery. patient required frequent reorientation. per staff, this has worsened as the day has progressed.      Exercises      General Comments        Pertinent Vitals/Pain Pain Assessment: Faces Pain Score: 10-Worst pain ever Faces Pain Scale: Hurts even more Pain Location: L knee ,  Pain Descriptors /  Indicators: Discomfort;Grimacing;Guarding;Restless Pain Intervention(s): Limited activity within patient's tolerance;Monitored during session;Repositioned    Home Living                      Prior Function            PT Goals (current goals can now be found in the care plan section) Progress towards PT goals: Not progressing toward goals - comment(AMS and, pain left leg,  unable to amb.)    Frequency    7X/week      PT Plan Current plan remains appropriate    Co-evaluation              AM-PAC PT "6 Clicks" Mobility   Outcome Measure  Help needed turning from your back to your side while in a flat bed without using bedrails?: Total Help needed moving from lying on your back to sitting on the side of a flat bed without using bedrails?: Total Help needed moving to and from a bed to a chair (including a wheelchair)?: Total Help needed standing up from a chair using your arms (e.g., wheelchair or bedside chair)?: Total Help needed to walk in hospital room?: Total Help needed climbing 3-5 steps with a railing? : Total 6 Click Score: 6    End of Session   Activity Tolerance: Patient limited by pain;Treatment limited secondary to medical complications (Comment) Patient left: in bed;with call bell/phone within reach Nurse Communication: Mobility status PT Visit Diagnosis: Other abnormalities of gait and mobility (R26.89);Difficulty in walking, not elsewhere classified (R26.2)     Time: 9147-8295 PT Time Calculation (min) (ACUTE ONLY): 35 min  Charges:  $Therapeutic Activity: 8-22 mins $Self Care/Home Management: [place] Pager [phone] Office [phone]    Claretha Cooper 07/16/2018, 4:38 PM

## 2018-07-16 NOTE — Progress Notes (Signed)
Subjective: 1 Day Post-Op Procedure(s) (LRB): TOTAL KNEE ARTHROPLASTY (Left) Patient reports pain as moderate.   Patient seen in rounds for Dr. Gladstone Lighter. Patient is doing fair but she is having issues with pain management this morning. She reports burning in her left knee. Denies SOB and chest pain. Foley just removed. Positive flatus.  Walked quite a bit with PT yesterday.  Plan is to go Home after hospital stay.  Objective: Vital signs in last 24 hours: Temp:  [97.7 F (36.5 C)-99.2 F (37.3 C)] 99.2 F (37.3 C) (02/27 0553) Pulse Rate:  [71-86] 71 (02/27 0553) Resp:  [10-19] 16 (02/27 0553) BP: (106-135)/(56-96) 133/96 (02/27 0553) SpO2:  [94 %-100 %] 94 % (02/27 0553)  Intake/Output from previous day:  Intake/Output Summary (Last 24 hours) at 07/16/2018 0736 Last data filed at 07/16/2018 0600 Gross per 24 hour  Intake 4130.69 ml  Output 3715 ml  Net 415.69 ml     Labs: Recent Labs    07/16/18 0500  HGB 10.6*   Recent Labs    07/16/18 0500  WBC 5.9  RBC 3.42*  HCT 34.2*  PLT 166   Recent Labs    07/16/18 0500  NA 138  K 4.5  CL 103  CO2 30  BUN 18  CREATININE 0.76  GLUCOSE 96  CALCIUM 9.0   Recent Labs    07/14/18 1127  INR 0.9    EXAM General - Patient is Alert and Oriented Extremity - Neurologically intact Intact pulses distally Dorsiflexion/Plantar flexion intact No cellulitis present Compartment soft Dressing - no drainage Motor Function - intact, moving foot and toes well on exam.  Hemovac pulled without difficulty.  Past Medical History:  Diagnosis Date  . Allergic headache   . Arthritis    "all over"  . Chronic back pain    "top of my neck to bottom of my feet" (08/25/2015)  . Diverticulitis   . H/O drainage of abscess    left buttocks area -closed drainage bag in place 10-12-15   . History of blood transfusion    58 years ago   . Hypertension   . Impaired mobility    "cervical neck- range of motion limitations"  .  Scoliosis   . TIA (transient ischemic attack) 06/03/2016   admitted with symtoms of tia all tests normal all symptoms resolved    Assessment/Plan: 1 Day Post-Op Procedure(s) (LRB): TOTAL KNEE ARTHROPLASTY (Left) Active Problems:   H/O total knee replacement, left  Estimated body mass index is 28.88 kg/m as calculated from the following:   Height as of this encounter: 4\' 11"  (1.499 m).   Weight as of this encounter: 64.9 kg. Advance diet Up with therapy D/C IV fluids when tolerating POs well  DVT Prophylaxis - Xarelto, TED hose and SCDs Weight-Bearing as tolerated  D/C O2 and Pulse OX and try on Room Air  Anticipated LOS equal to or greater than 2 midnights due to - Age 83 and older with one or more of the following:  - Obesity  - Expected need for hospital services (PT, OT, Nursing) required for safe  discharge  - Anticipated need for postoperative skilled nursing care or inpatient rehab  - Active co-morbidities: Stroke/TIA OR   - Unanticipated findings during/Post Surgery: None  - Patient is a high risk of re-admission due to: None  Will adjust pain medication from dilaudid PO to oxy IR PO. Continue gabapentin. Continue with PT today. Plan for DC home tomorrow with HHPT pending progress.  Ardeen Jourdain, PA-C Orthopaedic Surgery 07/16/2018, 7:36 AM

## 2018-07-17 LAB — BASIC METABOLIC PANEL
Anion gap: 6 (ref 5–15)
BUN: 17 mg/dL (ref 8–23)
CHLORIDE: 101 mmol/L (ref 98–111)
CO2: 28 mmol/L (ref 22–32)
Calcium: 8.5 mg/dL — ABNORMAL LOW (ref 8.9–10.3)
Creatinine, Ser: 0.71 mg/dL (ref 0.44–1.00)
GFR calc Af Amer: >60 mL/min (ref >60)
GFR calc non Af Amer: >60 mL/min (ref >60)
Glucose, Bld: 115 mg/dL — ABNORMAL HIGH (ref 70–99)
Potassium: 4.4 mmol/L (ref 3.5–5.1)
SODIUM: 135 mmol/L (ref 135–145)

## 2018-07-17 LAB — CBC
HCT: 30.2 % — ABNORMAL LOW (ref 36.0–46.0)
Hemoglobin: 9.7 g/dL — ABNORMAL LOW (ref 12.0–15.0)
MCH: 31.4 pg (ref 26.0–34.0)
MCHC: 32.1 g/dL (ref 30.0–36.0)
MCV: 97.7 fL (ref 80.0–100.0)
Platelets: 149 10*3/uL — ABNORMAL LOW (ref 150–400)
RBC: 3.09 MIL/uL — ABNORMAL LOW (ref 3.87–5.11)
RDW: 13.2 % (ref 11.5–15.5)
WBC: 7.1 10*3/uL (ref 4.0–10.5)
nRBC: 0 % (ref 0.0–0.2)

## 2018-07-17 MED ORDER — ONDANSETRON HCL 4 MG PO TABS: 4.0000 mg | ORAL_TABLET | Freq: Four times a day (QID) | ORAL | 0 refills | Status: AC | PRN

## 2018-07-17 NOTE — Care Management Note (Signed)
Case Management Note  Patient Details  Name: Elizabeth Mullins MRN: [id number] Date of Birth: February 06, 1932  Subjective/Objective:                  Discharge planning  Action/Plan: dme 3 in 1 and rolling youth walker advanced [facility] hhc-pt through [place].  Expected Discharge Date:  07/17/18               Expected Discharge Plan:  [place]  In-House Referral:     Discharge planning Services  CM Consult  Post Acute Care Choice:  Durable Medical Equipment, Home Health Choice offered to:     DME Arranged:  Bedside commode, Walker youth DME Agency:  [place]:  PT [place]:  [facility] (now [facility])  Status of Service:  Completed, signed off  If discussed at [employer] of Stay Meetings, dates discussed:    Additional Comments:  Elizabeth, Winger, RN 07/17/2018, 12:21 PM

## 2018-07-17 NOTE — Progress Notes (Signed)
Subjective: 2 Days Post-Op Procedure(s) (LRB): TOTAL KNEE ARTHROPLASTY (Left) Patient reports pain as 4 on 0-10 scale.3.She is doing much better except for knee pain as we expect.I plan to DC her today.    Objective: Vital signs in last 24 hours: Temp:  [97.7 F (36.5 C)-98.7 F (37.1 C)] 97.7 F (36.5 C) (02/28 0457) Pulse Rate:  [72-82] 82 (02/28 0457) Resp:  [16-18] 18 (02/28 0457) BP: (118-137)/(55-64) 118/55 (02/28 0457) SpO2:  [93 %-97 %] 93 % (02/28 0457)  Intake/Output from previous day: 02/27 0701 - 02/28 0700 In: 800 [P.O.:800] Out: 825 [Urine:825] Intake/Output this shift: No intake/output data recorded.  Recent Labs    07/16/18 0500 07/17/18 0443  HGB 10.6* 9.7*   Recent Labs    07/16/18 0500 07/17/18 0443  WBC 5.9 7.1  RBC 3.42* 3.09*  HCT 34.2* 30.2*  PLT 166 149*   Recent Labs    07/16/18 0500 07/17/18 0443  NA 138 135  K 4.5 4.4  CL 103 101  CO2 30 28  BUN 18 17  CREATININE 0.76 0.71  GLUCOSE 96 115*  CALCIUM 9.0 8.5*   Recent Labs    07/14/18 1127  INR 0.9    No cellulitis present Compartment soft   Assessment/Plan: 2 Days Post-Op Procedure(s) (LRB): TOTAL KNEE ARTHROPLASTY (Left) -  OR   -   Will DC today.PT at home.    Latanya Maudlin MD 07/17/2018, 7:14 AM

## 2018-07-17 NOTE — Plan of Care (Signed)
  Problem: Clinical Measurements: Goal: Will remain free from infection Outcome: Progressing Goal: Respiratory complications will improve Outcome: Progressing   Problem: Safety: Goal: Ability to remain free from injury will improve Outcome: Progressing   

## 2018-07-17 NOTE — Progress Notes (Signed)
Physical Therapy Treatment Patient Details Name: Elizabeth Mullins MRN: [id number] DOB: 12/11/1931 Today's Date: 07/17/2018    History of Present Illness 85 yo female s/p L TKR on 07/15/18. PMH includes hernia repair 2018, TIA, cervical pain with radiculopathy, anxiety, IBS, chronic back pain, cochlear hearing loss bilaterally, scoliosis, R TKR 2002.     PT Comments    MS is improved. Daughter present and strongly recommended that the patient not attempt any ambulation or mobility OOB without assistance.  The patient is unsteady with RW and requires frequent safety cues. Plans Dc home.  Recommended to use KI at night incase patient gets up without assistance.  Follow Up Recommendations  Follow surgeon's recommendation for DC plan and follow-up therapies;Supervision for mobility/OOB;Home health PT     Equipment Recommendations  Rolling walker with 5" wheels;3in1 (PT)    Recommendations for Other Services       Precautions / Restrictions Precautions Precautions: Fall;Knee Precaution Comments: dtr had taken KI to car., recommended may use at night for safety if patient were to get up without assistnace Required Braces or Orthoses: Knee Immobilizer - Left    Mobility  Bed Mobility   Bed Mobility: Supine to Sit;Sit to Supine     Supine to sit: Min assist Sit to supine: Min assist   General bed mobility comments: extra time,  support left leg off and onto bed. reports dizzinees upon sitting  Transfers Overall transfer level: Needs assistance Equipment used: Rolling walker (2 wheeled) Transfers: Sit to/from Stand Sit to Stand: Mod assist         General transfer comment: steady assist to rise from the bed and from toilet . Cues for  left leg position prior to sitting down. patient did not recall to perform second opportunity.  Ambulation/Gait Ambulation/Gait assistance: Mod assist Gait Distance (Feet): 40 Feet Assistive device: Rolling walker (2 wheeled) Gait  Pattern/deviations: Step-to pattern;Decreased step length - left;Decreased stance time - left;Antalgic Gait velocity: decr    General Gait Details: multimodal cues for safety with RW, tending to step outside, Cues for position inside the RW. gait is unsteady. Daughter informed that patient willl need assistance for every time up.   Stairs Stairs: Yes Stairs assistance: Mod assist Stair Management: Forwards;With walker Number of Stairs: 1 General stair comments: dtr present  and instructed to use safety belt and stand behind patient to step up onto step.    Wheelchair Mobility    Modified Rankin (Stroke Patients Only)       Balance                                            Cognition Arousal/Alertness: Awake/alert Behavior During Therapy: WFL for tasks assessed/performed                                   General Comments: patient is more alert and oriented this visit. continues to require multimodal cues for safety with the RW, Hand placement and position inside RW. Daughter present.      Exercises Total Joint Exercises Ankle Circles/Pumps: AROM;Both;10 reps Quad Sets: AROM;Both;10 reps Heel Slides: AAROM;Left;10 reps;Supine Hip ABduction/ADduction: AAROM;Left;10 reps;Supine Goniometric ROM: 10-60 left knee flex    General Comments        Pertinent Vitals/Pain Faces Pain Scale: Hurts even more Pain Location: L  knee ,  Pain Descriptors / Indicators: Discomfort;Grimacing;Guarding;Restless;Spasm Pain Intervention(s): Monitored during session;Premedicated before session    Home Living                      Prior Function            PT Goals (current goals can now be found in the care plan section) Progress towards PT goals: Progressing toward goals    Frequency    7X/week      PT Plan Current plan remains appropriate    Co-evaluation              AM-PAC PT "6 Clicks" Mobility   Outcome Measure  Help  needed turning from your back to your side while in a flat bed without using bedrails?: A Lot Help needed moving from lying on your back to sitting on the side of a flat bed without using bedrails?: A Lot Help needed moving to and from a bed to a chair (including a wheelchair)?: Total Help needed standing up from a chair using your arms (e.g., wheelchair or bedside chair)?: Total Help needed to walk in hospital room?: Total Help needed climbing 3-5 steps with a railing? : Total 6 Click Score: 8    End of Session Equipment Utilized During Treatment: Gait belt Activity Tolerance: Patient tolerated treatment well;Patient limited by fatigue Patient left: in bed;with family/visitor present;with call bell/phone within reach Nurse Communication: Mobility status(ready for DC) PT Visit Diagnosis: Other abnormalities of gait and mobility (R26.89);Difficulty in walking, not elsewhere classified (R26.2)     Time: 6226-3335 PT Time Calculation (min) (ACUTE ONLY): 34 min  Charges:  $Gait Training: 8-22 mins $Therapeutic Exercise: 8-22 mins                     Tresa Endo PT Acute Rehabilitation Services Pager [phone] Office [phone]    Claretha Cooper 07/17/2018, 4:04 PM

## 2018-07-17 NOTE — Progress Notes (Signed)
Subjective: 2 Days Post-Op Procedure(s) (LRB): TOTAL KNEE ARTHROPLASTY (Left) Patient reports pain as 3 on 0-10 scale.    Objective: Vital signs in last 24 hours: Temp:  [97.7 F (36.5 C)-98.7 F (37.1 C)] 97.7 F (36.5 C) (02/28 0457) Pulse Rate:  [72-82] 82 (02/28 0457) Resp:  [16-18] 18 (02/28 0457) BP: (118-137)/(55-64) 118/55 (02/28 0457) SpO2:  [93 %-97 %] 93 % (02/28 0457)  Intake/Output from previous day: 02/27 0701 - 02/28 0700 In: 800 [P.O.:800] Out: 825 [Urine:825] Intake/Output this shift: No intake/output data recorded.  Recent Labs    07/16/18 0500 07/17/18 0443  HGB 10.6* 9.7*   Recent Labs    07/16/18 0500 07/17/18 0443  WBC 5.9 7.1  RBC 3.42* 3.09*  HCT 34.2* 30.2*  PLT 166 149*   Recent Labs    07/16/18 0500 07/17/18 0443  NA 138 135  K 4.5 4.4  CL 103 101  CO2 30 28  BUN 18 17  CREATININE 0.76 0.71  GLUCOSE 96 115*  CALCIUM 9.0 8.5*   Recent Labs    07/14/18 1127  INR 0.9    Dorsiflexion/Plantar flexion intact   Assessment/Plan: 2 Days Post-Op Procedure(s) (LRB): TOTAL KNEE ARTHROPLASTY (Left) Discharge home with home health      Latanya Maudlin 07/17/2018, 7:07 AM

## 2018-07-17 NOTE — Care Management Important Message (Signed)
Important Message  Patient Details  Name: Elizabeth Mullins MRN: [id number] Date of Birth: [birth year]   Medicare Important Message Given:  Yes    Kerin Salen 07/17/2018, 11:09 AMImportant Message  Patient Details  Name: Elizabeth Mullins MRN: [id number] Date of Birth: June 22, 1931   Medicare Important Message Given:  Yes    Kerin Salen 07/17/2018, 11:09 AM

## 2018-07-17 NOTE — Care Management Note (Signed)
Case Management Note  Patient Details  Name: Elizabeth Mullins MRN: [id number] Date of Birth: 1932/04/10  Subjective/Objective:                  Discharge planning  Action/Plan: dme- 3in1 and youth walker-advanced dme [facility]  Expected Discharge Date:  07/17/18               Expected Discharge Plan:  Home/Self Care  In-House Referral:     Discharge planning Services  CM Consult  Post Acute Care Choice:  Durable Medical Equipment Choice offered to:     DME Arranged:  Bedside commode, Walker youth DME Agency:  [place]:    [facility] Agency:     Status of Service:  Completed, signed off  If discussed at [employer] of Stay Meetings, dates discussed:    Additional Comments:  Yoshiye, Kraft, RN 07/17/2018, 9:56 AM

## 2018-07-20 NOTE — Discharge Summary (Signed)
Physician Discharge Summary   Patient ID: FLORNCE RECORD MRN: [id number] DOB/AGE: October 28, 1931 85 y.o.  Admit date: 07/15/2018 Discharge date: 07/17/2018  Primary Diagnosis: Primary osteoarthritis left knee   Admission Diagnoses:  Past Medical History:  Diagnosis Date  . Allergic headache   . Arthritis    "all over"  . Chronic back pain    "top of my neck to bottom of my feet" (08/25/2015)  . Diverticulitis   . H/O drainage of abscess    left buttocks area -closed drainage bag in place 10-12-15   . History of blood transfusion    58 years ago   . Hypertension   . Impaired mobility    "cervical neck- range of motion limitations"  . Scoliosis   . TIA (transient ischemic attack) 06/03/2016   admitted with symtoms of tia all tests normal all symptoms resolved   Discharge Diagnoses:   Active Problems:   H/O total knee replacement, left  Estimated body mass index is 28.88 kg/m as calculated from the following:   Height as of this encounter: 4\' 11"  (1.499 m).   Weight as of this encounter: 64.9 kg.  Procedure:  Procedure(s) (LRB): TOTAL KNEE ARTHROPLASTY (Left)   Consults: None  HPI: Elizabeth Mullins, 85 y.o. female, has a history of pain and functional disability in the left knee due to arthritis and has failed non-surgical conservative treatments for greater than 12 weeks to includeNSAID's and/or analgesics, corticosteriod injections, flexibility and strengthening excercises and activity modification.  Onset of symptoms was gradual, starting 5 years ago with gradually worsening course since that time. The patient noted no past surgery on the left knee(s).  Patient currently rates pain in the left knee(s) at 8 out of 10 with activity. Patient has night pain, worsening of pain with activity and weight bearing, pain that interferes with activities of daily living, pain with passive range of motion, crepitus and joint swelling.  Patient has evidence of periarticular osteophytes and joint space  narrowing by imaging studies.There is no active infection.   Laboratory Data: Admission on 07/15/2018, Discharged on 07/17/2018  Component Date Value Ref Range Status  . WBC 07/16/2018 5.9  4.0 - 10.5 K/uL Final  . RBC 07/16/2018 3.42* 3.87 - 5.11 MIL/uL Final  . Hemoglobin 07/16/2018 10.6* 12.0 - 15.0 g/dL Final  . HCT 07/16/2018 34.2* 36.0 - 46.0 % Final  . MCV 07/16/2018 100.0  80.0 - 100.0 fL Final  . MCH 07/16/2018 31.0  26.0 - 34.0 pg Final  . MCHC 07/16/2018 31.0  30.0 - 36.0 g/dL Final  . RDW 07/16/2018 12.9  11.5 - 15.5 % Final  . Platelets 07/16/2018 166  150 - 400 K/uL Final  . nRBC 07/16/2018 0.0  0.0 - 0.2 % Final   Performed at [facility], [place] [address], [place], [place] [address]  . Sodium 07/16/2018 138  135 - 145 mmol/L Final  . Potassium 07/16/2018 4.5  3.5 - 5.1 mmol/L Final  . Chloride 07/16/2018 103  98 - 111 mmol/L Final  . CO2 07/16/2018 30  22 - 32 mmol/L Final  . Glucose, Bld 07/16/2018 96  70 - 99 mg/dL Final  . BUN 07/16/2018 18  8 - 23 mg/dL Final  . Creatinine, Ser 07/16/2018 0.76  0.44 - 1.00 mg/dL Final  . Calcium 07/16/2018 9.0  8.9 - 10.3 mg/dL Final  . GFR calc non Af Amer 07/16/2018 >60  >60 mL/min Final  . GFR calc Af Amer 07/16/2018 >60  >  60 mL/min Final  . Anion gap 07/16/2018 5  5 - 15 Final   Performed at [facility], [place] [address], [place], [place] [address]  . WBC 07/17/2018 7.1  4.0 - 10.5 K/uL Final  . RBC 07/17/2018 3.09* 3.87 - 5.11 MIL/uL Final  . Hemoglobin 07/17/2018 9.7* 12.0 - 15.0 g/dL Final  . HCT 07/17/2018 30.2* 36.0 - 46.0 % Final  . MCV 07/17/2018 97.7  80.0 - 100.0 fL Final  . MCH 07/17/2018 31.4  26.0 - 34.0 pg Final  . MCHC 07/17/2018 32.1  30.0 - 36.0 g/dL Final  . RDW 07/17/2018 13.2  11.5 - 15.5 % Final  . Platelets 07/17/2018 149* 150 - 400 K/uL Final  . nRBC 07/17/2018 0.0  0.0 - 0.2 % Final   Performed at [facility], [place] [address],  [place], [place] [address]  . Sodium 07/17/2018 135  135 - 145 mmol/L Final  . Potassium 07/17/2018 4.4  3.5 - 5.1 mmol/L Final  . Chloride 07/17/2018 101  98 - 111 mmol/L Final  . CO2 07/17/2018 28  22 - 32 mmol/L Final  . Glucose, Bld 07/17/2018 115* 70 - 99 mg/dL Final  . BUN 07/17/2018 17  8 - 23 mg/dL Final  . Creatinine, Ser 07/17/2018 0.71  0.44 - 1.00 mg/dL Final  . Calcium 07/17/2018 8.5* 8.9 - 10.3 mg/dL Final  . GFR calc non Af Amer 07/17/2018 >60  >60 mL/min Final  . GFR calc Af Amer 07/17/2018 >60  >60 mL/min Final  . Anion gap 07/17/2018 6  5 - 15 Final   Performed at [facility], [place] [address], [place], [place] [address]  Hospital Outpatient Visit on 07/14/2018  Component Date Value Ref Range Status  . MRSA, PCR 07/14/2018 NEGATIVE  NEGATIVE Final  . Staphylococcus aureus 07/14/2018 NEGATIVE  NEGATIVE Final   Comment: (NOTE) The Xpert SA Assay (FDA approved for NASAL specimens in patients 50 years of age and older), is one component of a comprehensive surveillance program. It is not intended to diagnose infection nor to guide or monitor treatment. Performed at [facility], [place] [address] [place], [place] [address]   . aPTT 07/14/2018 27  24 - 36 seconds Final   Performed at [facility], [place] [address] [place], [place] [address]  . Prothrombin Time 07/14/2018 11.9  11.4 - 15.2 seconds Final  . INR 07/14/2018 0.9  0.8 - 1.2 Final   Comment: (NOTE) INR goal varies based on device and disease states. Performed at [facility] [place], [place] [address], [place], [place] [address]   . ABO/RH(D) 07/14/2018 O POS   Final  . Antibody Screen 07/14/2018 NEG   Final  . Sample Expiration 07/14/2018 07/18/2018   Final  . Extend sample reason 07/14/2018    Final                   Value:NO TRANSFUSIONS OR PREGNANCY IN THE PAST 3 MONTHS Performed at [place] [address] [place],  [place] [address]      Hospital Course: Elizabeth Mullins is a 85 y.o. who was admitted to [facility]. They were brought to the operating room on 07/15/2018 and underwent Procedure(s): TOTAL KNEE ARTHROPLASTY.  Patient tolerated the procedure well and was later transferred to the recovery room and then to the orthopaedic floor for postoperative care.  They were given PO and IV analgesics for pain control  following their surgery.  They were given 24 hours of postoperative antibiotics of  Anti-infectives (From admission, onward)   Start     Dose/Rate Route Frequency Ordered Stop   07/15/18 1400  ceFAZolin (ANCEF) IVPB 1 g/50 mL premix     1 g 100 mL/hr over 30 Minutes Intravenous Every 6 hours 07/15/18 1047 07/15/18 2026   07/15/18 0826  polymyxin B 500,000 Units, bacitracin 50,000 Units in sodium chloride 0.9 % 500 mL irrigation  Status:  Discontinued       As needed 07/15/18 0826 07/15/18 0948   07/15/18 0600  ceFAZolin (ANCEF) IVPB 2g/100 mL premix     2 g 200 mL/hr over 30 Minutes Intravenous On call to O.R. 07/15/18 5621 07/15/18 0741     and started on DVT prophylaxis in the form of Xarelto.   PT and OT were ordered for total joint protocol.  Discharge planning consulted to help with postop disposition and equipment needs.  Patient had a fair night on the evening of surgery.  They started to get up OOB with therapy on day one. Hemovac drain was pulled without difficulty. She had some issues with pain management but improved with switch from dilaudid PO to oxy IR PO.  Continued to work with therapy into day two.  Dressing was changed on day two and the incision was clean and dry.  The patient had progressed with therapy and meeting their goals.  Incision was healing well.  Patient was seen in rounds and was ready to go home.   Diet: Cardiac diet Activity:WBAT Follow-up:in 2 weeks Disposition - Home Discharged Condition: stable   Discharge Instructions    Call MD / Call 911   Complete by:   As directed    If you experience chest pain or shortness of breath, CALL 911 and be transported to the hospital emergency room.  If you develope a fever above 101 F, pus (white drainage) or increased drainage or redness at the wound, or calf pain, call your surgeon's office.   Constipation Prevention   Complete by:  As directed    Drink plenty of fluids.  Prune juice may be helpful.  You may use a stool softener, such as Colace (over the counter) 100 mg twice a day.  Use MiraLax (over the counter) for constipation as needed.   Diet - low sodium heart healthy   Complete by:  As directed    Discharge instructions   Complete by:  As directed    Dr. Latanya Maudlin [facility] [address] [place], [place] [address] [phone]  TOTAL KNEE REPLACEMENT POSTOPERATIVE DIRECTIONS  Knee Rehabilitation, Guidelines Following Surgery  Results after knee surgery are often greatly improved when you follow the exercise, range of motion and muscle strengthening exercises prescribed by your doctor. Safety measures are also important to protect the knee from further injury. Any time any of these exercises cause you to have increased pain or swelling in your knee joint, decrease the amount until you are comfortable again and slowly increase them. If you have problems or questions, call your caregiver or physical therapist for advice.   HOME CARE INSTRUCTIONS  Remove items at home which could result in a fall. This includes throw rugs or furniture in walking pathways.  ICE to the affected knee every three hours for 30 minutes at a time and then as needed for pain and swelling.  Continue to use ice on the knee for pain and swelling from surgery. You  may notice swelling that will progress down to the foot and ankle.  This is normal after surgery.  Elevate the leg when you are not up walking on it.   Continue to use the breathing machine which will help keep your temperature down.  It is common for  your temperature to cycle up and down following surgery, especially at night when you are not up moving around and exerting yourself.  The breathing machine keeps your lungs expanded and your temperature down. Do not place pillow under knee, focus on keeping the knee straight while resting   DIET You may resume your previous home diet once your are discharged from the hospital.  DRESSING / WOUND CARE / SHOWERING You may shower 3 days after surgery, but keep the wounds dry during showering.  You may use an occlusive plastic wrap (Press'n Seal for example), NO SOAKING/SUBMERGING IN THE BATHTUB.  If the bandage gets wet, change with a clean dry gauze.  If the incision gets wet, pat the wound dry with a clean towel. You may start showering once you are discharged home but do not submerge the incision under water. Just pat the incision dry and apply a dry gauze dressing on daily. Change the surgical dressing daily and reapply a dry dressing each time.  ACTIVITY Walk with your walker as instructed. Use walker as long as suggested by your caregivers. Avoid periods of inactivity such as sitting longer than an hour when not asleep. This helps prevent blood clots.  You may resume a sexual relationship in one month or when given the OK by your doctor.  You may return to work once you are cleared by your doctor.  Do not drive a car for 6 weeks or until released by you surgeon.  Do not drive while taking narcotics.  WEIGHT BEARING Weight bearing as tolerated with assist device (walker, cane, etc) as directed, use it as long as suggested by your surgeon or therapist, typically at least 4-6 weeks.  POSTOPERATIVE CONSTIPATION PROTOCOL Constipation - defined medically as fewer than three stools per week and severe constipation as less than one stool per week.  One of the most common issues patients have following surgery is constipation.  Even if you have a regular bowel pattern at home, your normal regimen  is likely to be disrupted due to multiple reasons following surgery.  Combination of anesthesia, postoperative narcotics, change in appetite and fluid intake all can affect your bowels.  In order to avoid complications following surgery, here are some recommendations in order to help you during your recovery period.  Colace (docusate) - Pick up an over-the-counter form of Colace or another stool softener and take twice a day as long as you are requiring postoperative pain medications.  Take with a full glass of water daily.  If you experience loose stools or diarrhea, hold the colace until you stool forms back up.  If your symptoms do not get better within 1 week or if they get worse, check with your doctor.  Dulcolax (bisacodyl) - Pick up over-the-counter and take as directed by the product packaging as needed to assist with the movement of your bowels.  Take with a full glass of water.  Use this product as needed if not relieved by Colace only.   MiraLax (polyethylene glycol) - Pick up over-the-counter to have on hand.  MiraLax is a solution that will increase the amount of water in your bowels to assist with bowel movements.  Take as  directed and can mix with a glass of water, juice, soda, coffee, or tea.  Take if you go more than two days without a movement. Do not use MiraLax more than once per day. Call your doctor if you are still constipated or irregular after using this medication for 7 days in a row.  If you continue to have problems with postoperative constipation, please contact the office for further assistance and recommendations.  If you experience "the worst abdominal pain ever" or develop nausea or vomiting, please contact the office immediatly for further recommendations for treatment.  ITCHING  If you experience itching with your medications, try taking only a single pain pill, or even half a pain pill at a time.  You can also use Benadryl over the counter for itching or also to help  with sleep.   TED HOSE STOCKINGS Wear the elastic stockings on both legs for three weeks following surgery during the day but you may remove then at night for sleeping.  MEDICATIONS See your medication summary on the "After Visit Summary" that the nursing staff will review with you prior to discharge.  You may have some home medications which will be placed on hold until you complete the course of blood thinner medication.  It is important for you to complete the blood thinner medication as prescribed by your surgeon.  Continue your approved medications as instructed at time of discharge.  PRECAUTIONS If you experience chest pain or shortness of breath - call 911 immediately for transfer to the hospital emergency department.  If you develop a fever greater that 101 F, purulent drainage from wound, increased redness or drainage from wound, foul odor from the wound/dressing, or calf pain - CONTACT YOUR SURGEON.                                                   FOLLOW-UP APPOINTMENTS Make sure you keep all of your appointments after your operation with your surgeon and caregivers. You should call the office at the above phone number and make an appointment for approximately two weeks after the date of your surgery or on the date instructed by your surgeon outlined in the "After Visit Summary".   RANGE OF MOTION AND STRENGTHENING EXERCISES  Rehabilitation of the knee is important following a knee injury or an operation. After just a few days of immobilization, the muscles of the thigh which control the knee become weakened and shrink (atrophy). Knee exercises are designed to build up the tone and strength of the thigh muscles and to improve knee motion. Often times heat used for twenty to thirty minutes before working out will loosen up your tissues and help with improving the range of motion but do not use heat for the first two weeks following surgery. These exercises can be done on a training  (exercise) mat, on the floor, on a table or on a bed. Use what ever works the best and is most comfortable for you Knee exercises include:  Leg Lifts - While your knee is still immobilized in a splint or cast, you can do straight leg raises. Lift the leg to 60 degrees, hold for 3 sec, and slowly lower the leg. Repeat 10-20 times 2-3 times daily. Perform this exercise against resistance later as your knee gets better.  Quad and Hamstring Sets - Tighten up  the muscle on the front of the thigh (Quad) and hold for 5-10 sec. Repeat this 10-20 times hourly. Hamstring sets are done by pushing the foot backward against an object and holding for 5-10 sec. Repeat as with quad sets.  Leg Slides: Lying on your back, slowly slide your foot toward your buttocks, bending your knee up off the floor (only go as far as is comfortable). Then slowly slide your foot back down until your leg is flat on the floor again. Angel Wings: Lying on your back spread your legs to the side as far apart as you can without causing discomfort.  A rehabilitation program following serious knee injuries can speed recovery and prevent re-injury in the future due to weakened muscles. Contact your doctor or a physical therapist for more information on knee rehabilitation.   IF YOU ARE TRANSFERRED TO A SKILLED REHAB FACILITY If the patient is transferred to a skilled rehab facility following release from the hospital, a list of the current medications will be sent to the facility for the patient to continue.  When discharged from the skilled rehab facility, please have the facility set up the patient's [place] prior to being released. Also, the skilled facility will be responsible for providing the patient with their medications at time of release from the facility to include their pain medication, the muscle relaxants, and their blood thinner medication. If the patient is still at the rehab facility at time of the two week  follow up appointment, the skilled rehab facility will also need to assist the patient in arranging follow up appointment in our office and any transportation needs.  MAKE SURE YOU:  Understand these instructions.  Get help right away if you are not doing well or get worse.    Pick up stool softner and laxative for home use following surgery while on pain medications. Do not submerge incision under water. Please use good hand washing techniques while changing dressing each day. May shower starting three days after surgery. Please use a clean towel to pat the incision dry following showers. Continue to use ice for pain and swelling after surgery. Do not use any lotions or creams on the incision until instructed by your surgeon.   Increase activity slowly as tolerated   Complete by:  As directed      Allergies as of 07/17/2018      Reactions   Valsartan-hydrochlorothiazide    Low sodium, high potassium   Codeine Nausea And Vomiting, Other (See Comments)   Not a true allergy she needs nausea medication if given this medication.   Demerol [meperidine] Nausea And Vomiting, Other (See Comments)   Not a true allergy she needs nausea medication if given this medication.   Morphine And Related Nausea And Vomiting, Other (See Comments)   Not a true allergy she needs nausea medication if given this medication.      Medication List    STOP taking these medications   traMADol 50 MG tablet Commonly known as:  ULTRAM     TAKE these medications   acetaminophen 500 MG tablet Commonly known as:  TYLENOL Take 500 mg by mouth every 6 (six) hours as needed.   aspirin EC 325 MG tablet Take 1 tablet (325 mg total) by mouth 2 (two) times daily.   aspirin-acetaminophen-caffeine 250-250-65 MG tablet Commonly known as:  EXCEDRIN MIGRAINE Take 1 tablet by mouth 2 (two) times daily.   atenolol 25 MG tablet Commonly known as:  TENORMIN  Take 25 mg by mouth every evening.   B-complex with vitamin  C tablet Take 1 tablet by mouth daily.   beta carotene w/minerals tablet Take 1 tablet by mouth daily.   carisoprodol 350 MG tablet Commonly known as:  SOMA Take 350 mg by mouth at bedtime.   doxazosin 1 MG tablet Commonly known as:  CARDURA Take 1 mg by mouth at bedtime.   escitalopram 20 MG tablet Commonly known as:  LEXAPRO Take 20 mg by mouth daily.   FISH OIL PO Take 1 capsule by mouth daily.   gabapentin 800 MG tablet Commonly known as:  NEURONTIN Take 800 mg by mouth 2 (two) times daily.   lisinopril 40 MG tablet Commonly known as:  PRINIVIL,ZESTRIL Take 40 mg by mouth daily.   methocarbamol 500 MG tablet Commonly known as:  ROBAXIN Take 1 tablet (500 mg total) by mouth every 6 (six) hours as needed for muscle spasms.   ondansetron 4 MG tablet Commonly known as:  ZOFRAN Take 1 tablet (4 mg total) by mouth every 6 (six) hours as needed for nausea.   oxyCODONE 5 MG immediate release tablet Commonly known as:  Oxy IR/ROXICODONE Take 1-2 tablets (5-10 mg total) by mouth every 6 (six) hours as needed for severe pain.   polyethylene glycol powder powder Commonly known as:  GLYCOLAX/MIRALAX Take 17 g by mouth daily.   Red Yeast Rice 600 MG Caps Take 1,200 mg by mouth daily.   vitamin E 400 UNIT capsule Generic drug:  vitamin E Take 400 Units by mouth daily.      Follow-up Information    Latanya Maudlin, MD Follow up in 2 week(s).   Specialty:  Orthopedic Surgery Contact information: [address] [place] [address] [phone]           Signed: Ardeen Jourdain, PA-C Orthopaedic Surgery 07/20/2018, 8:32 AM

## 2023-10-27 ENCOUNTER — Other Ambulatory Visit: Payer: Self-pay

## 2023-10-27 ENCOUNTER — Ambulatory Visit (INDEPENDENT_AMBULATORY_CARE_PROVIDER_SITE_OTHER): Admitting: Internal Medicine

## 2023-10-27 ENCOUNTER — Encounter: Payer: Self-pay | Admitting: Internal Medicine

## 2023-10-27 VITALS — BP 178/78 | HR 74 | Temp 97.3°F | Resp 20 | Ht 59.25 in | Wt 137.3 lb

## 2023-10-27 DIAGNOSIS — R42 Dizziness and giddiness: Secondary | ICD-10-CM | POA: Diagnosis not present

## 2023-10-27 DIAGNOSIS — Z77120 Contact with and (suspected) exposure to mold (toxic): Secondary | ICD-10-CM

## 2023-10-27 DIAGNOSIS — J310 Chronic rhinitis: Secondary | ICD-10-CM

## 2023-10-27 DIAGNOSIS — I10 Essential (primary) hypertension: Secondary | ICD-10-CM

## 2023-10-27 DIAGNOSIS — H9113 Presbycusis, bilateral: Secondary | ICD-10-CM

## 2023-10-27 MED ORDER — IPRATROPIUM BROMIDE 0.06 % NA SOLN: 2.0000 | Freq: Four times a day (QID) | NASAL | 12 refills | Status: AC

## 2023-10-27 NOTE — Patient Instructions (Signed)
 Allergic rhinitis Chronic postnasal drip with worsening symptoms despite current treatments. Concerns about medication side effects, particularly on hypertension, have limited use. Nasal ipratropium suggested to reduce drainage without affecting blood pressure. Proper nasal spray technique is crucial for effectiveness. - Start nasal ipratropium spray, one spray in each nostril up to four times a day as needed for drainage. - Order IgE blood testing for mold allergy. - Educated on proper nasal spray technique to avoid tasting the medication. - May consider skin testing depending blood work  - Avoid any OTCs with pseudoephedrine or -D as this is the part that can affect her blood pressure   Dizziness Balance issues not typical for allergic rhinitis. Differential includes vertigo or inner ear issues.  - Consider ENT referral for vertigo evaluation if dizziness persists after nasal treatment.  Follow up: we will call you with lab results and next steps.  If you would like to pursue functional medicine, [facility] is a clinic in [place] area

## 2023-10-27 NOTE — Progress Notes (Unsigned)
 NEW PATIENT Date of Service/Encounter:  10/27/23 Referring provider: Lieutenant Reese.,* Primary care provider: Lieutenant Reese., MD  Subjective:  PIERRE CUMPTON is a 89 y.o. female  presenting today for evaluation of mold allergy  History obtained from: chart review and patient and daughter .   Discussed the use of AI scribe software for clinical note transcription with the patient, who gave verbal consent to proceed.  History of Present Illness TRISTINE LANGI is a 89 year old female with chronic allergies who presents with worsening sinus drainage and dizziness. She is accompanied by her daughter, who assists with communication and provides additional history.  She has a long-standing history of chronic allergies, which have become year-round. She experiences constant postnasal drip, both day and night, and describes her head as feeling 'heavy'. Frequent sneezing fits occur, and she attempts to expectorate the drainage, which sometimes gets caught in her throat. Various treatments, including saline sprays, Mucinex , and over-the-counter medications suitable for high blood pressure, have provided inconsistent relief. A prescription nasal spray was tried but was not effective and was not used for an extended period.  She experiences dizziness described as feeling like she is 'on a boat', which occurs even while walking and not just upon standing. This dizziness has progressively worsened and is accompanied by a sensation of head heaviness. She uses a cane for stability and sometimes needs to hold onto walls to prevent falling.  There is a known presence of visible mold in her home, specifically in the garage and bathroom, and there have been water  problems in the basement. Her daughter mentions that she has not yet conducted mold remediation due to her own health issues and lack of energy.  She has a history of high blood pressure, which influences the choice of over-the-counter  medications she can use. Her daughter notes that some medications have affected her blood pressure, leading to cautious use of allergy medications.  No issues with cold air, dust, or strong odors causing her nose to run. She does not experience food-induced nasal drainage.    Past Medical History: Past Medical History:  Diagnosis Date   Allergic headache    Arthritis    "all over"   Chronic back pain    "top of my neck to bottom of my feet" (08/25/2015)   Diverticulitis    H/O drainage of abscess    left buttocks area -closed drainage bag in place 10-12-15    History of blood transfusion    58 years ago    Hypertension    Impaired mobility    "cervical neck- range of motion limitations"   Scoliosis    TIA (transient ischemic attack) 06/03/2016   admitted with symtoms of tia all tests normal all symptoms resolved   Medication List:  Current Outpatient Medications  Medication Sig Dispense Refill   acetaminophen  (TYLENOL ) 500 MG tablet Take 500 mg by mouth every 6 (six) hours as needed.     aspirin  EC 325 MG tablet Take 1 tablet (325 mg total) by mouth 2 (two) times daily. 30 tablet 0   atenolol  (TENORMIN ) 25 MG tablet Take 25 mg by mouth every evening.  1   B Complex-C (B-COMPLEX WITH VITAMIN C ) tablet Take 1 tablet by mouth daily.     beta carotene w/minerals (OCUVITE) tablet Take 1 tablet by mouth daily.     carisoprodol  (SOMA ) 350 MG tablet Take 350 mg by mouth at bedtime.     doxazosin  (CARDURA ) 1 MG  tablet Take 1 mg by mouth at bedtime.     escitalopram  (LEXAPRO ) 20 MG tablet Take 20 mg by mouth daily.     gabapentin  (NEURONTIN ) 800 MG tablet Take 800 mg by mouth 2 (two) times daily.      lisinopril  (PRINIVIL ,ZESTRIL ) 40 MG tablet Take 40 mg by mouth daily.     methocarbamol  (ROBAXIN ) 500 MG tablet Take 1 tablet (500 mg total) by mouth every 6 (six) hours as needed for muscle spasms. 40 tablet 0   Omega-3 Fatty Acids (FISH OIL PO) Take 1 capsule by mouth daily.      ondansetron  (ZOFRAN ) 4 MG tablet Take 1 tablet (4 mg total) by mouth every 6 (six) hours as needed for nausea. 40 tablet 0   oxyCODONE  (OXY IR/ROXICODONE ) 5 MG immediate release tablet Take 1-2 tablets (5-10 mg total) by mouth every 6 (six) hours as needed for severe pain. 56 tablet 0   polyethylene glycol powder (GLYCOLAX /MIRALAX ) powder Take 17 g by mouth daily.     Red Yeast Rice 600 MG CAPS Take 1,200 mg by mouth daily.      vitamin E  (VITAMIN E ) 400 UNIT capsule Take 400 Units by mouth daily.     aspirin -acetaminophen -caffeine (EXCEDRIN MIGRAINE) 250-250-65 MG tablet Take 1 tablet by mouth 2 (two) times daily.     No current facility-administered medications for this visit.   Known Allergies:  Allergies  Allergen Reactions   Valsartan-Hydrochlorothiazide     Low sodium, high potassium   Codeine Nausea And Vomiting and Other (See Comments)    Not a true allergy she needs nausea medication if given this medication.   Demerol [Meperidine] Nausea And Vomiting and Other (See Comments)    Not a true allergy she needs nausea medication if given this medication.    Morphine  And Codeine Nausea And Vomiting and Other (See Comments)    Not a true allergy she needs nausea medication if given this medication.    Past Surgical History: Past Surgical History:  Procedure Laterality Date   APPENDECTOMY  1960s   COLON SURGERY  10/18/2015   CT GUIDE ABSCESS DRAINAGE  ([facility] HX)  08/25/2015   pelvic diverticular abscess   DILATION AND CURETTAGE OF UTERUS     EYE SURGERY Right 07/14/2017   CATARACT   INSERTION OF MESH N/A 06/21/2016   Procedure: INSERTION OF MESH;  Surgeon: Candyce Champagne, MD;  Location: [facility];  Service: General;  Laterality: N/A;   IR GENERIC HISTORICAL  09/20/2015   IR RADIOLOGIST EVAL & MGMT 09/20/2015 Geary Kells, PA-C [facility]   IR GENERIC HISTORICAL  10/03/2015   IR RADIOLOGIST EVAL & MGMT 10/03/2015 [facility]   JOINT REPLACEMENT  2002   Dr Dante Dyer    TONSILLECTOMY     TOTAL KNEE ARTHROPLASTY Right 2002   TOTAL KNEE ARTHROPLASTY Left 07/15/2018   Procedure: TOTAL KNEE ARTHROPLASTY;  Surgeon: Hazle Lites, MD;  Location: [facility];  Service: Orthopedics;  Laterality: Left;    VAGINAL HYSTERECTOMY  1973   "partial"   VENTRAL HERNIA REPAIR N/A 06/21/2016   Procedure: LAPAROSCOPIC VENTRAL WALL HERNIA REPAIR WITH ERAS PATHWAY;  Surgeon: Candyce Champagne, MD;  Location: [facility];  Service: General;  Laterality: N/A;   Family History: No family history on file. Social History: Ania lives ***.   ROS:  All other systems negative except as noted per HPI.  Objective:  There were no vitals taken for this visit. There is no height or weight on  file to calculate BMI. Physical Exam:  General Appearance:  Alert, cooperative, no distress, appears stated age  Head:  Normocephalic, without obvious abnormality, atraumatic  Eyes:  Conjunctiva clear, EOM's intact  Ears EACs normal bilaterally  Nose: Nares normal, erythematous nasal mucosa , no visible anterior polyps, and septum midline  Throat: Lips, tongue normal; teeth and gums normal, + cobblestoning  Neck: Supple, symmetrical  Lungs:   clear to auscultation bilaterally, Respirations unlabored, no coughing  Heart:  regular rate and rhythm and no murmur, Appears well perfused  Extremities: No edema  Skin: Skin color, texture, turgor normal and no rashes or lesions on visualized portions of skin  Neurologic: No gross deficits   Diagnostics: None done    Labs:   Lab Orders         Allergen Profile, Mold         Allergens Zone 2      Assessment and Plan  Assessment and Plan Assessment & Plan Allergic rhinitis Chronic postnasal drip with worsening symptoms despite current treatments. Concerns about medication side effects, particularly on hypertension, have limited use. Nasal ipratropium suggested to reduce drainage without affecting blood pressure. Proper nasal spray technique is crucial  for effectiveness. - Start nasal ipratropium spray, one spray in each nostril up to four times a day as needed for drainage. - Order IgE blood testing for mold allergy. - Educated on proper nasal spray technique to avoid tasting the medication. - May consider skin testing depending blood work  - Avoid any OTCs with pseudoephedrine or -D as this is the part that can affect her blood pressure   Dizziness Balance issues not typical for allergic rhinitis. Differential includes vertigo or inner ear issues.  - Consider ENT referral for vertigo evaluation if dizziness persists after nasal treatment.  Follow up: we will call you with lab results and next steps.  If you would like to pursue functional medicine, [facility] is a clinic in [place] area   Thank you so much for letting me partake in your care today.  Don't hesitate to reach out if you have any additional concerns!  Orelia Binet, MD  [facility] [place], [place]   This note in its entirety was forwarded to the Provider who requested this consultation.  Other:    Thank you for your kind referral. I appreciate the opportunity to take part in Denis's care. Please do not hesitate to contact me with questions.  Sincerely,  Thank you so much for letting me partake in your care today.  Don't hesitate to reach out if you have any additional concerns!  Orelia Binet, MD  [facility] [place], [place]

## 2023-10-30 ENCOUNTER — Ambulatory Visit: Payer: Self-pay | Admitting: Internal Medicine

## 2023-10-30 LAB — IGE+ALLERGENS ZONE 2(30)

## 2023-10-30 LAB — ALLERGEN PROFILE, MOLD
Aureobasidi Pullulans IgE: <0.1 kU/L
Candida Albicans IgE: <0.1 kU/L
M009-IgE Fusarium proliferatum: <0.1 kU/L
M014-IgE Epicoccum purpur: <0.1 kU/L
Phoma Betae IgE: <0.1 kU/L
Setomelanomma Rostrat: <0.1 kU/L

## 2023-10-30 NOTE — Progress Notes (Signed)
 Blood work was negative for IgE for all molds tested.  Can someone let patient know?

## 2023-11-25 ENCOUNTER — Ambulatory Visit: Admitting: Internal Medicine
